# Patient Record
Sex: Male | Born: 1935 | Race: White | Hispanic: No | Marital: Married | Smoking: Former smoker
Health system: Southern US, Community
[De-identification: ages and names within clinical notes are randomized; demographics above are authoritative.]

## PROBLEM LIST (undated history)

## (undated) DIAGNOSIS — M109 Gout, unspecified: Secondary | ICD-10-CM

## (undated) DIAGNOSIS — K219 Gastro-esophageal reflux disease without esophagitis: Secondary | ICD-10-CM

## (undated) DIAGNOSIS — M1990 Unspecified osteoarthritis, unspecified site: Secondary | ICD-10-CM

## (undated) DIAGNOSIS — R011 Cardiac murmur, unspecified: Secondary | ICD-10-CM

## (undated) DIAGNOSIS — M5020 Other cervical disc displacement, unspecified cervical region: Secondary | ICD-10-CM

## (undated) DIAGNOSIS — N200 Calculus of kidney: Secondary | ICD-10-CM

## (undated) DIAGNOSIS — I10 Essential (primary) hypertension: Secondary | ICD-10-CM

## (undated) HISTORY — DX: Unspecified osteoarthritis, unspecified site: M19.90

## (undated) HISTORY — DX: Essential (primary) hypertension: I10

## (undated) HISTORY — PX: NASAL SEPTUM SURGERY: SHX37

## (undated) HISTORY — PX: OTHER SURGICAL HISTORY: SHX169

## (undated) HISTORY — DX: Calculus of kidney: N20.0

## (undated) HISTORY — DX: Gout, unspecified: M10.9

## (undated) HISTORY — DX: Cardiac murmur, unspecified: R01.1

## (undated) HISTORY — DX: Gastro-esophageal reflux disease without esophagitis: K21.9

## (undated) HISTORY — DX: Other cervical disc displacement, unspecified cervical region: M50.20

## (undated) HISTORY — PX: INGUINAL HERNIA REPAIR: SUR1180

---

## 2001-10-23 ENCOUNTER — Ambulatory Visit (HOSPITAL_COMMUNITY): Admission: RE | Admit: 2001-10-23 | Discharge: 2001-10-23 | Payer: Self-pay | Admitting: Internal Medicine

## 2001-10-23 ENCOUNTER — Encounter: Payer: Self-pay | Admitting: Internal Medicine

## 2001-11-20 ENCOUNTER — Ambulatory Visit (HOSPITAL_COMMUNITY): Admission: RE | Admit: 2001-11-20 | Discharge: 2001-11-20 | Payer: Self-pay | Admitting: Otolaryngology

## 2004-04-04 ENCOUNTER — Ambulatory Visit (HOSPITAL_COMMUNITY): Admission: RE | Admit: 2004-04-04 | Discharge: 2004-04-04 | Payer: Self-pay | Admitting: Internal Medicine

## 2004-04-15 ENCOUNTER — Ambulatory Visit (HOSPITAL_COMMUNITY): Admission: RE | Admit: 2004-04-15 | Discharge: 2004-04-15 | Payer: Self-pay | Admitting: General Surgery

## 2008-07-01 ENCOUNTER — Ambulatory Visit (HOSPITAL_COMMUNITY): Admission: RE | Admit: 2008-07-01 | Discharge: 2008-07-01 | Payer: Self-pay | Admitting: Family Medicine

## 2008-12-24 ENCOUNTER — Ambulatory Visit (HOSPITAL_COMMUNITY): Admission: RE | Admit: 2008-12-24 | Discharge: 2008-12-24 | Payer: Self-pay | Admitting: Internal Medicine

## 2009-12-05 ENCOUNTER — Inpatient Hospital Stay (HOSPITAL_COMMUNITY): Admission: AC | Admit: 2009-12-05 | Discharge: 2009-12-11 | Payer: Self-pay

## 2009-12-05 ENCOUNTER — Ambulatory Visit: Payer: Self-pay | Admitting: Cardiology

## 2009-12-06 ENCOUNTER — Encounter (INDEPENDENT_AMBULATORY_CARE_PROVIDER_SITE_OTHER): Payer: Self-pay | Admitting: General Surgery

## 2009-12-28 ENCOUNTER — Encounter: Admission: RE | Admit: 2009-12-28 | Discharge: 2009-12-28 | Payer: Self-pay | Admitting: General Surgery

## 2010-07-22 ENCOUNTER — Ambulatory Visit (HOSPITAL_COMMUNITY): Admission: RE | Admit: 2010-07-22 | Discharge: 2010-07-22 | Payer: Self-pay | Admitting: Internal Medicine

## 2010-09-25 ENCOUNTER — Encounter: Payer: Self-pay | Admitting: Family Medicine

## 2010-11-23 LAB — COMPREHENSIVE METABOLIC PANEL
ALT: 17 U/L (ref 0–53)
AST: 22 U/L (ref 0–37)
BUN: 45 mg/dL — ABNORMAL HIGH (ref 6–23)
CO2: 20 meq/L (ref 19–32)
Calcium: 9.6 mg/dL (ref 8.4–10.5)
Chloride: 111 meq/L (ref 96–112)
Creatinine, Ser: 2.1 mg/dL — ABNORMAL HIGH (ref 0.4–1.5)
GFR calc Af Amer: 38 mL/min — ABNORMAL LOW (ref >60)
Total Bilirubin: 0.8 mg/dL (ref 0.3–1.2)
Total Protein: 5.9 g/dL — ABNORMAL LOW (ref 6.0–8.3)

## 2010-11-23 LAB — TYPE AND SCREEN: Antibody Screen: NEGATIVE

## 2010-11-23 LAB — POCT I-STAT, CHEM 8
BUN: 46 mg/dL — ABNORMAL HIGH (ref 6–23)
Potassium: 3.9 meq/L (ref 3.5–5.1)
Sodium: 139 meq/L (ref 135–145)

## 2010-11-23 LAB — CARDIAC PANEL(CRET KIN+CKTOT+MB+TROPI)
CK, MB: 9.6 ng/mL (ref 0.3–4.0)
Relative Index: 4.5 — ABNORMAL HIGH (ref 0.0–2.5)
Total CK: 213 U/L (ref 7–232)

## 2010-11-23 LAB — BASIC METABOLIC PANEL
BUN: 25 mg/dL — ABNORMAL HIGH (ref 6–23)
BUN: 34 mg/dL — ABNORMAL HIGH (ref 6–23)
BUN: 41 mg/dL — ABNORMAL HIGH (ref 6–23)
CO2: 22 mEq/L (ref 19–32)
CO2: 22 mEq/L (ref 19–32)
Calcium: 8.9 mg/dL (ref 8.4–10.5)
Chloride: 109 mEq/L (ref 96–112)
Chloride: 112 mEq/L (ref 96–112)
Chloride: 113 mEq/L — ABNORMAL HIGH (ref 96–112)
Creatinine, Ser: 1.24 mg/dL (ref 0.4–1.5)
Creatinine, Ser: 1.66 mg/dL — ABNORMAL HIGH (ref 0.4–1.5)
Creatinine, Ser: 1.91 mg/dL — ABNORMAL HIGH (ref 0.4–1.5)
Creatinine, Ser: 2 mg/dL — ABNORMAL HIGH (ref 0.4–1.5)
GFR calc Af Amer: 40 mL/min — ABNORMAL LOW (ref >60)
GFR calc Af Amer: 49 mL/min — ABNORMAL LOW (ref >60)
GFR calc Af Amer: >60 mL/min (ref >60)
GFR calc non Af Amer: 35 mL/min — ABNORMAL LOW (ref >60)
GFR calc non Af Amer: 41 mL/min — ABNORMAL LOW (ref >60)
Glucose, Bld: 144 mg/dL — ABNORMAL HIGH (ref 70–99)
Potassium: 3.9 mEq/L (ref 3.5–5.1)
Potassium: 4.1 mEq/L (ref 3.5–5.1)
Potassium: 4.3 mEq/L (ref 3.5–5.1)
Potassium: 5 mEq/L (ref 3.5–5.1)
Sodium: 137 mEq/L (ref 135–145)

## 2010-11-23 LAB — CBC
HCT: 28 % — ABNORMAL LOW (ref 39.0–52.0)
HCT: 37.5 % — ABNORMAL LOW (ref 39.0–52.0)
Hemoglobin: 13.3 g/dL (ref 13.0–17.0)
Hemoglobin: 9.9 g/dL — ABNORMAL LOW (ref 13.0–17.0)
MCHC: 34.3 g/dL (ref 30.0–36.0)
MCHC: 34.5 g/dL (ref 30.0–36.0)
MCHC: 35 g/dL (ref 30.0–36.0)
MCHC: 35 g/dL (ref 30.0–36.0)
MCV: 91.9 fL (ref 78.0–100.0)
MCV: 92.4 fL (ref 78.0–100.0)
MCV: 92.7 fL (ref 78.0–100.0)
Platelets: 130 10*3/uL — ABNORMAL LOW (ref 150–400)
Platelets: 143 10*3/uL — ABNORMAL LOW (ref 150–400)
Platelets: 155 10*3/uL (ref 150–400)
Platelets: 158 10*3/uL (ref 150–400)
RBC: 2.82 MIL/uL — ABNORMAL LOW (ref 4.22–5.81)
RBC: 3.02 MIL/uL — ABNORMAL LOW (ref 4.22–5.81)
RBC: 3.06 MIL/uL — ABNORMAL LOW (ref 4.22–5.81)
RBC: 3.2 MIL/uL — ABNORMAL LOW (ref 4.22–5.81)
RBC: 3.79 MIL/uL — ABNORMAL LOW (ref 4.22–5.81)
RBC: 4.11 MIL/uL — ABNORMAL LOW (ref 4.22–5.81)
RDW: 14.7 % (ref 11.5–15.5)
RDW: 14.9 % (ref 11.5–15.5)
WBC: 10.7 10*3/uL — ABNORMAL HIGH (ref 4.0–10.5)
WBC: 7.2 10*3/uL (ref 4.0–10.5)
WBC: 9 10*3/uL (ref 4.0–10.5)

## 2010-11-23 LAB — DIFFERENTIAL
Basophils Absolute: 0 10*3/uL (ref 0.0–0.1)
Basophils Relative: 0 % (ref 0–1)
Basophils Relative: 1 % (ref 0–1)
Eosinophils Absolute: 0.1 10*3/uL (ref 0.0–0.7)
Eosinophils Relative: 1 % (ref 0–5)
Lymphs Abs: 1.1 10*3/uL (ref 0.7–4.0)
Monocytes Absolute: 0.6 10*3/uL (ref 0.1–1.0)
Monocytes Relative: 6 % (ref 3–12)
Monocytes Relative: 8 % (ref 3–12)
Neutrophils Relative %: 83 % — ABNORMAL HIGH (ref 43–77)
Neutrophils Relative %: 84 % — ABNORMAL HIGH (ref 43–77)

## 2010-11-23 LAB — CK TOTAL AND CKMB (NOT AT ARMC)
Relative Index: 0.7 (ref 0.0–2.5)
Total CK: 143 U/L (ref 7–232)
Total CK: 565 U/L — ABNORMAL HIGH (ref 7–232)

## 2010-11-23 LAB — TROPONIN I
Troponin I: 0.01 ng/mL (ref 0.00–0.06)
Troponin I: <0.01 ng/mL (ref 0.00–0.06)

## 2010-11-23 LAB — LIPASE, BLOOD: Lipase: 306 U/L — ABNORMAL HIGH (ref 11–59)

## 2010-11-23 LAB — PROTIME-INR
INR: 1.31 (ref 0.00–1.49)
Prothrombin Time: 16.2 s — ABNORMAL HIGH (ref 11.6–15.2)

## 2011-01-20 NOTE — Op Note (Signed)
NAME:  Philip Mcclain, Philip Mcclain                             ACCOUNT NO.:  [id number]   MEDICAL RECORD NO.:  [id number]                   PATIENT TYPE:  AMB   LOCATION:  DAY                                  FACILITY:  [facility]   PHYSICIAN:  Jamesetta So, M.D.               DATE OF BIRTH:  07-17-1934   DATE OF PROCEDURE:  04/15/2004  DATE OF DISCHARGE:                                 OPERATIVE REPORT   PREOPERATIVE DIAGNOSIS:  Left inguinal hernia.   POSTOPERATIVE DIAGNOSIS:  Left inguina hernia.   PROCEDURE:  Left inguinal herniorrhaphy.   SURGEON:  Jamesetta So, M.D.   ANESTHESIA:  Spinal.   INDICATIONS:  The patient is a 77 year old who presents with a left inguinal  hernia. The risks and benefits of the procedure including bleeding,  infection, pain, and recurrence of the hernia were fully explained to the  patient who gave informed consent.   PROCEDURE NOTE:  The patient was placed in the supine position. After spinal  anesthesia was administered, the left groin region was prepped and draped  using the usual sterile technique with Betadine. Surgical site confirmation  was performed.   Transverse incision was made in the left groin region down to the external  oblique aponeurosis. The aponeurosis was excised to the external ring. A  Penrose drain was placed around the spermatic cord. The vas deferens was  noted within the spermatic cord. A small lipoma of the cord was excised  without difficulty. The patient was noted to have a direct hernia. This was  excised at its base and inverted. A large sized polypropylene mesh plug was  then placed in this region, secured circumferentially to the transversalis  fascia using 2-0 Novofil interrupted sutures. An [place] polypropylene mesh  patch was then placed along the floor of the inguinal canal and secured  superiorly to the conjoined tendon and inferiorly to the shelving edge of  Poupart's ligament using a 2-0 Novofil interrupted suture. The  internal ring  was recreated using a 2-0 Novofil interrupted suture. The ilioinguinal nerve  was identified and kept from the operative field during the dissection. The  external oblique aponeurosis was reapproximated using a 2-0 Vicryl running  suture. The subcutaneous ligament was reapproximated using a 3-0 Vicryl  interrupted suture. The skin was closed using a 4-0 Vicryl subcuticular  suture. Then, 0.5 Sensorcaine was instilled into the surrounding wound, and  the wound was covered with collodion.   All tape and needle counts were correct at the end of the procedure. The  patient was transferred to PACU in stable condition.   COMPLICATIONS:  None.   SPECIMENS:  None.   BLOOD LOSS:  Minimal.      ___________________________________________  Jamesetta So, M.D.   MAJ/MEDQ  D:  04/15/2004  T:  04/16/2004  Job:  [id number]   cc:   Sherrilee Gilles. Gerarda Fraction, M.D.  [address] [place] [id number]  Fax: [phone]

## 2011-01-20 NOTE — Op Note (Signed)
[facility]  Patient:    NYAL, EBAUGH Visit Number: [id number] MRN: [id number]          Service Type: DSU Location: DAY Attending Physician:  Lavone Nian Dictated by:   Marikay Alar. Erik Obey, M.D. Proc. Date: 11/20/01 Admit Date:  11/20/2001 Discharge Date: 11/20/2001                             Operative Report  PREOPERATIVE DIAGNOSES:  Right submandibular sialolithiasis and sialadenitis.  POSTOPERATIVE DIAGNOSES:  Right submandibular sialolithiasis and sialadenitis.  PROCEDURE PERFORMED:  Right submandibular gland excision.  SURGEON:  Marikay Alar. Erik Obey, M.D.  ANESTHESIA:  General orotracheal.  BLOOD LOSS:  Minimal.  COMPLICATIONS:  None.  FINDINGS:  A large gland with mostly fatty degeneration.  A 2.5-cm-plus solid stone in the superior surface of the gland immediately beneath the floor of the mouth and basically in the hilum of the gland approaching the duct. Fibrosis in this region to the lingual nerve.  Hypoglossal nerve and lingual nerve intact.  DESCRIPTION OF PROCEDURE:  With the patient in a comfortable supine position, general orotracheal anesthesia was induced without difficulty.  At an appropriate level, the table was turned 90 degrees and the patient placed in a slight reversed Trendelenburg and the head was rotated towards the left.  The right neck was palpated, with the findings as described above, and a suitable relaxed skin tension line/wrinkle was identified and infiltrated with 5 cc of 1% Xylocaine with 1:100,000 epinephrine.  Several minutes were allowed for hemostasis to take effect.  A sterile preparation and draping of the right neck were performed.  An 8-cm incision was made in the previously identified wrinkle and carried down through skin, subcutaneous fats and platysmal muscle.  A subplatysmal plane was elevated superiorly.  The submandibular triangle was palpated.  At the inferior most extent of the triangle,  the fascia overlying the gland was incised and carefully elevated from the surface of the gland and carried upward to protect the ramus mandibularis nerve.  The nerve itself was not deliberately sought out.  Working with sharp and blunt dissection, the gland was carefully dissected but was almost indistinguishable from adjacent fat. Working posteriorly, branches of the facial artery and vein were identified and only those that were penetrating the gland were isolated, divided and ligated.  Working down towards the digastric muscle, the gland was rolled superiorly.  Working anteriorly, fatty tissue and gland were taken from lateral down toward the mylohyoid muscle and off of the anterior aspect of the digastric muscle.  Working superiorly along the capsule of the gland, the facial artery and vein were identified as the exit of the gland and were controlled in this location.  Working iteratively, the gland was further mobilized.  The mylohyoid muscle was elevated and the gland was dissected out from underneath the muscle.  Deep to the digastric muscle, blunt dissection revealed the hypoglossal nerves.  Branches of the ______ vein were carefully avoided.  Working more superiorly, the lingual nerve was identified.  The stone was palpated within the content of the gland but immediately beneath the lingual nerve.  The nerve was peeled away from the stone.  The duct was identified and was divided and ligated.  At this point, additional fibrosis and fibrous tissue separated that kept the gland attached and this was carefully dissected, divided and the gland was delivered.  The gland was opened and the stone was  removed.  No other pathology was identified.  This was sent to pathology for interpretation.  At this point, the wound was inspected.  There was mild oozing from several areas.  Electrocautery was used in noncritical areas.  In the area near the lingual nerve or near the hypoglossal nerve,  silk ligatures were used to control.  Hemostasis was observed.  A Valsalva was given and hemostasis was persistent.  At this point, the wound was thoroughly irrigated.  A one-quarter-inch Penrose drain was placed in the depths of the wound and brought out to the skin surface.  The wound was reapproximated with interrupted 3-0 chromic in the platysmal layer.  Finally, the skin was closed in a cosmetic fashion with a running simple 5-0 Ethilon.  A standard fluff and wrap dressing was applied.  At this point, the procedure was completed. Patient was returned to anesthesia, awakened, extubated, and transferred to recovery in stable condition.  COMMENT:  [age]-year-old white male, status post submandibular stone removal as much as 50 years ago, now for the past several months has had some progressive swelling in the right upper neck and both office examination and CT findings consistent with a large stone in the ______  gland which could not be retrieved in the office, hence the indication for todays procedure. Will anticipate routine postoperative recovery including elevation, analgesia, ice, antibiosis.  We will remove the drain in one day and the sutures in seven days. Dictated by:   Marikay Alar. Erik Obey, M.D. Attending Physician:  Lavone Nian DD:  11/20/01 TD:  11/22/01 Job: [id number]

## 2011-01-20 NOTE — H&P (Signed)
NAME:  Philip Mcclain, Philip Mcclain                             ACCOUNT NO.:  [id number]   MEDICAL RECORD NO.:  [id number]                   PATIENT TYPE:  AMB   LOCATION:  DAY                                  FACILITY:  [facility]   PHYSICIAN:  Jamesetta So, M.D.               DATE OF BIRTH:  [birth year]   DATE OF ADMISSION:  DATE OF DISCHARGE:                                HISTORY & PHYSICAL   CHIEF COMPLAINT:  Left inguinal hernia.   HISTORY OF PRESENT ILLNESS:  The patient is a 77 year old white male who is  referred for evaluation and treatment of a left inguinal hernia.  This has  been present intermittently for some time.  No nausea or vomiting have been  noted.  He does complain of having pain on both sides.  No fever or chills  have been noted.   PAST MEDICAL HISTORY:  Includes hypertension.   PAST SURGICAL HISTORY:  Arthroscopy of the left knee, salivary gland tumor  removal.   CURRENT MEDICATIONS:  1. Lopressor 20 mg p.o. daily.  2. Hydrochlorothiazide 10 mg p.o. daily.   ALLERGIES:  No known drug allergies.   REVIEW OF SYMPTOMS:  Noncontributory.   PHYSICAL EXAMINATION:  GENERAL:  The patient is a well-developed, well-  nourished white male in no acute distress.  VITAL SIGNS:  Patient is afebrile and his vital signs are stable.  LUNGS:  Clear to auscultation with equal breath sounds bilaterally.  HEART:  Examination reveals regular rate and rhythm without S3, S4, or  murmurs.  ABDOMEN:  Soft, nontender, nondistended.  No hepatosplenomegaly or masses  are noted.  A reducible left inguinal hernia is noted.  A weak right  inguinal floor is noted.  GENITOURINARY:  Within normal limits.   IMPRESSION:  Left inguinal hernia.   PLAN:  The patient is scheduled for a left inguinal herniorrhaphy on April 15, 2004.  The risks and benefits of the procedure including bleeding,  infection, pain, the possible recurrence of the hernia, were fully explained  to the patient, who gave informed  consent.     ___________________________________________                                         Jamesetta So, M.D.   MAJ/MEDQ  D:  04/14/2004  T:  04/14/2004  Job:  [id number]   cc:   Sherrilee Gilles. Gerarda Fraction, M.D.  [address] [place] [id number]  Fax: [phone]

## 2011-02-07 ENCOUNTER — Other Ambulatory Visit (INDEPENDENT_AMBULATORY_CARE_PROVIDER_SITE_OTHER): Payer: Self-pay | Admitting: General Surgery

## 2011-02-07 DIAGNOSIS — W3400XA Accidental discharge from unspecified firearms or gun, initial encounter: Secondary | ICD-10-CM

## 2011-02-07 LAB — COMPREHENSIVE METABOLIC PANEL
Albumin: 4.3 g/dL (ref 3.5–5.2)
CO2: 29 mEq/L (ref 19–32)
Calcium: 10.2 mg/dL (ref 8.4–10.5)
Chloride: 106 mEq/L (ref 96–112)
Glucose, Bld: 84 mg/dL (ref 70–99)
Potassium: 4.1 mEq/L (ref 3.5–5.3)
Sodium: 140 mEq/L (ref 135–145)
Total Protein: 5.9 g/dL — ABNORMAL LOW (ref 6.0–8.3)

## 2011-02-09 ENCOUNTER — Ambulatory Visit
Admission: RE | Admit: 2011-02-09 | Discharge: 2011-02-09 | Disposition: A | Discharge status: Home / Self Care | Payer: Medicare Other | Source: Ambulatory Visit | Attending: General Surgery | Admitting: General Surgery

## 2011-02-09 DIAGNOSIS — W3400XA Accidental discharge from unspecified firearms or gun, initial encounter: Secondary | ICD-10-CM

## 2011-02-09 MED ORDER — IOHEXOL 300 MG/ML  SOLN: 100.0000 mL | Freq: Once | INTRAMUSCULAR | Status: AC | PRN

## 2011-02-16 ENCOUNTER — Encounter (INDEPENDENT_AMBULATORY_CARE_PROVIDER_SITE_OTHER): Payer: Self-pay | Admitting: General Surgery

## 2011-03-09 ENCOUNTER — Ambulatory Visit (INDEPENDENT_AMBULATORY_CARE_PROVIDER_SITE_OTHER): Payer: Self-pay | Admitting: General Surgery

## 2011-03-09 ENCOUNTER — Ambulatory Visit (INDEPENDENT_AMBULATORY_CARE_PROVIDER_SITE_OTHER): Payer: Medicare Other | Admitting: General Surgery

## 2011-03-09 ENCOUNTER — Encounter (INDEPENDENT_AMBULATORY_CARE_PROVIDER_SITE_OTHER): Payer: Self-pay | Admitting: General Surgery

## 2011-03-09 VITALS — BP 170/94 | HR 96 | Temp 96.8°F | Ht 70.0 in | Wt 190.6 lb

## 2011-03-09 DIAGNOSIS — S31109A Unspecified open wound of abdominal wall, unspecified quadrant without penetration into peritoneal cavity, initial encounter: Secondary | ICD-10-CM
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
## 2014-01-29 ENCOUNTER — Other Ambulatory Visit: Payer: Self-pay | Admitting: Neurosurgery

## 2014-01-29 DIAGNOSIS — M5416 Radiculopathy, lumbar region: Secondary | ICD-10-CM

## 2014-02-10 ENCOUNTER — Ambulatory Visit
Admission: RE | Admit: 2014-02-10 | Discharge: 2014-02-10 | Disposition: A | Discharge status: Home / Self Care | Payer: Medicare Other | Source: Ambulatory Visit | Attending: Neurosurgery | Admitting: Neurosurgery

## 2014-02-10 DIAGNOSIS — M5416 Radiculopathy, lumbar region: Secondary | ICD-10-CM

## 2014-10-15 ENCOUNTER — Other Ambulatory Visit: Payer: Self-pay | Admitting: Orthopedic Surgery

## 2014-10-15 DIAGNOSIS — M25511 Pain in right shoulder: Secondary | ICD-10-CM

## 2014-10-30 ENCOUNTER — Ambulatory Visit
Admission: RE | Admit: 2014-10-30 | Discharge: 2014-10-30 | Disposition: A | Discharge status: Home / Self Care | Payer: Medicare Other | Source: Ambulatory Visit | Attending: Orthopedic Surgery | Admitting: Orthopedic Surgery

## 2014-10-30 DIAGNOSIS — M25511 Pain in right shoulder: Secondary | ICD-10-CM

## 2015-09-29 DIAGNOSIS — I10 Essential (primary) hypertension: Secondary | ICD-10-CM | POA: Diagnosis not present

## 2015-09-29 DIAGNOSIS — Z87891 Personal history of nicotine dependence: Secondary | ICD-10-CM | POA: Diagnosis not present

## 2015-09-29 DIAGNOSIS — M545 Low back pain: Secondary | ICD-10-CM | POA: Diagnosis not present

## 2015-10-29 DIAGNOSIS — N19 Unspecified kidney failure: Secondary | ICD-10-CM | POA: Diagnosis not present

## 2015-10-29 DIAGNOSIS — I272 Other secondary pulmonary hypertension: Secondary | ICD-10-CM | POA: Diagnosis not present

## 2015-10-29 DIAGNOSIS — M1990 Unspecified osteoarthritis, unspecified site: Secondary | ICD-10-CM | POA: Diagnosis not present

## 2015-11-10 DIAGNOSIS — M159 Polyosteoarthritis, unspecified: Secondary | ICD-10-CM | POA: Diagnosis not present

## 2015-11-10 DIAGNOSIS — I10 Essential (primary) hypertension: Secondary | ICD-10-CM | POA: Diagnosis not present

## 2015-11-25 DIAGNOSIS — M1990 Unspecified osteoarthritis, unspecified site: Secondary | ICD-10-CM | POA: Diagnosis not present

## 2015-11-25 DIAGNOSIS — I10 Essential (primary) hypertension: Secondary | ICD-10-CM | POA: Diagnosis not present

## 2015-11-25 DIAGNOSIS — M545 Low back pain: Secondary | ICD-10-CM | POA: Diagnosis not present

## 2015-12-21 DIAGNOSIS — M159 Polyosteoarthritis, unspecified: Secondary | ICD-10-CM | POA: Diagnosis not present

## 2015-12-21 DIAGNOSIS — I10 Essential (primary) hypertension: Secondary | ICD-10-CM | POA: Diagnosis not present

## 2015-12-23 DIAGNOSIS — I10 Essential (primary) hypertension: Secondary | ICD-10-CM | POA: Diagnosis not present

## 2015-12-23 DIAGNOSIS — M545 Low back pain: Secondary | ICD-10-CM | POA: Diagnosis not present

## 2015-12-23 DIAGNOSIS — Z299 Encounter for prophylactic measures, unspecified: Secondary | ICD-10-CM | POA: Diagnosis not present

## 2016-01-10 DIAGNOSIS — I10 Essential (primary) hypertension: Secondary | ICD-10-CM | POA: Diagnosis not present

## 2016-01-10 DIAGNOSIS — M159 Polyosteoarthritis, unspecified: Secondary | ICD-10-CM | POA: Diagnosis not present

## 2016-01-11 DIAGNOSIS — M5416 Radiculopathy, lumbar region: Secondary | ICD-10-CM | POA: Diagnosis not present

## 2016-01-11 DIAGNOSIS — M4316 Spondylolisthesis, lumbar region: Secondary | ICD-10-CM | POA: Diagnosis not present

## 2016-01-11 DIAGNOSIS — M4806 Spinal stenosis, lumbar region: Secondary | ICD-10-CM | POA: Diagnosis not present

## 2016-01-11 DIAGNOSIS — M4726 Other spondylosis with radiculopathy, lumbar region: Secondary | ICD-10-CM | POA: Diagnosis not present

## 2016-01-11 DIAGNOSIS — M5136 Other intervertebral disc degeneration, lumbar region: Secondary | ICD-10-CM | POA: Diagnosis not present

## 2016-01-11 DIAGNOSIS — M4156 Other secondary scoliosis, lumbar region: Secondary | ICD-10-CM | POA: Diagnosis not present

## 2016-01-11 DIAGNOSIS — M546 Pain in thoracic spine: Secondary | ICD-10-CM | POA: Diagnosis not present

## 2016-01-13 DIAGNOSIS — M5136 Other intervertebral disc degeneration, lumbar region: Secondary | ICD-10-CM | POA: Diagnosis not present

## 2016-01-13 DIAGNOSIS — M4726 Other spondylosis with radiculopathy, lumbar region: Secondary | ICD-10-CM | POA: Diagnosis not present

## 2016-01-13 DIAGNOSIS — M4806 Spinal stenosis, lumbar region: Secondary | ICD-10-CM | POA: Diagnosis not present

## 2016-01-21 DIAGNOSIS — I272 Other secondary pulmonary hypertension: Secondary | ICD-10-CM | POA: Diagnosis not present

## 2016-01-21 DIAGNOSIS — Z6831 Body mass index (BMI) 31.0-31.9, adult: Secondary | ICD-10-CM | POA: Diagnosis not present

## 2016-01-21 DIAGNOSIS — M545 Low back pain: Secondary | ICD-10-CM | POA: Diagnosis not present

## 2016-01-21 DIAGNOSIS — M1990 Unspecified osteoarthritis, unspecified site: Secondary | ICD-10-CM | POA: Diagnosis not present

## 2016-01-21 DIAGNOSIS — N19 Unspecified kidney failure: Secondary | ICD-10-CM | POA: Diagnosis not present

## 2016-02-01 DIAGNOSIS — M9902 Segmental and somatic dysfunction of thoracic region: Secondary | ICD-10-CM | POA: Diagnosis not present

## 2016-02-01 DIAGNOSIS — M545 Low back pain: Secondary | ICD-10-CM | POA: Diagnosis not present

## 2016-02-01 DIAGNOSIS — M9905 Segmental and somatic dysfunction of pelvic region: Secondary | ICD-10-CM | POA: Diagnosis not present

## 2016-02-01 DIAGNOSIS — M9903 Segmental and somatic dysfunction of lumbar region: Secondary | ICD-10-CM | POA: Diagnosis not present

## 2016-02-04 DIAGNOSIS — M9903 Segmental and somatic dysfunction of lumbar region: Secondary | ICD-10-CM | POA: Diagnosis not present

## 2016-02-04 DIAGNOSIS — M9905 Segmental and somatic dysfunction of pelvic region: Secondary | ICD-10-CM | POA: Diagnosis not present

## 2016-02-04 DIAGNOSIS — M545 Low back pain: Secondary | ICD-10-CM | POA: Diagnosis not present

## 2016-02-04 DIAGNOSIS — M9902 Segmental and somatic dysfunction of thoracic region: Secondary | ICD-10-CM | POA: Diagnosis not present

## 2016-02-07 DIAGNOSIS — M545 Low back pain: Secondary | ICD-10-CM | POA: Diagnosis not present

## 2016-02-07 DIAGNOSIS — M9902 Segmental and somatic dysfunction of thoracic region: Secondary | ICD-10-CM | POA: Diagnosis not present

## 2016-02-07 DIAGNOSIS — M9903 Segmental and somatic dysfunction of lumbar region: Secondary | ICD-10-CM | POA: Diagnosis not present

## 2016-02-07 DIAGNOSIS — M9905 Segmental and somatic dysfunction of pelvic region: Secondary | ICD-10-CM | POA: Diagnosis not present

## 2016-02-09 DIAGNOSIS — M545 Low back pain: Secondary | ICD-10-CM | POA: Diagnosis not present

## 2016-02-09 DIAGNOSIS — M9902 Segmental and somatic dysfunction of thoracic region: Secondary | ICD-10-CM | POA: Diagnosis not present

## 2016-02-09 DIAGNOSIS — M9903 Segmental and somatic dysfunction of lumbar region: Secondary | ICD-10-CM | POA: Diagnosis not present

## 2016-02-09 DIAGNOSIS — M9905 Segmental and somatic dysfunction of pelvic region: Secondary | ICD-10-CM | POA: Diagnosis not present

## 2016-02-10 DIAGNOSIS — M159 Polyosteoarthritis, unspecified: Secondary | ICD-10-CM | POA: Diagnosis not present

## 2016-02-10 DIAGNOSIS — I10 Essential (primary) hypertension: Secondary | ICD-10-CM | POA: Diagnosis not present

## 2016-02-11 DIAGNOSIS — M5136 Other intervertebral disc degeneration, lumbar region: Secondary | ICD-10-CM | POA: Diagnosis not present

## 2016-02-11 DIAGNOSIS — M4156 Other secondary scoliosis, lumbar region: Secondary | ICD-10-CM | POA: Diagnosis not present

## 2016-02-11 DIAGNOSIS — I10 Essential (primary) hypertension: Secondary | ICD-10-CM | POA: Diagnosis not present

## 2016-02-11 DIAGNOSIS — M4316 Spondylolisthesis, lumbar region: Secondary | ICD-10-CM | POA: Diagnosis not present

## 2016-02-11 DIAGNOSIS — M9902 Segmental and somatic dysfunction of thoracic region: Secondary | ICD-10-CM | POA: Diagnosis not present

## 2016-02-11 DIAGNOSIS — M4726 Other spondylosis with radiculopathy, lumbar region: Secondary | ICD-10-CM | POA: Diagnosis not present

## 2016-02-11 DIAGNOSIS — M9905 Segmental and somatic dysfunction of pelvic region: Secondary | ICD-10-CM | POA: Diagnosis not present

## 2016-02-11 DIAGNOSIS — Z6828 Body mass index (BMI) 28.0-28.9, adult: Secondary | ICD-10-CM | POA: Diagnosis not present

## 2016-02-11 DIAGNOSIS — M545 Low back pain: Secondary | ICD-10-CM | POA: Diagnosis not present

## 2016-02-11 DIAGNOSIS — M4806 Spinal stenosis, lumbar region: Secondary | ICD-10-CM | POA: Diagnosis not present

## 2016-02-11 DIAGNOSIS — M9903 Segmental and somatic dysfunction of lumbar region: Secondary | ICD-10-CM | POA: Diagnosis not present

## 2016-02-11 DIAGNOSIS — M5416 Radiculopathy, lumbar region: Secondary | ICD-10-CM | POA: Diagnosis not present

## 2016-02-18 DIAGNOSIS — M9902 Segmental and somatic dysfunction of thoracic region: Secondary | ICD-10-CM | POA: Diagnosis not present

## 2016-02-18 DIAGNOSIS — M545 Low back pain: Secondary | ICD-10-CM | POA: Diagnosis not present

## 2016-02-18 DIAGNOSIS — M9903 Segmental and somatic dysfunction of lumbar region: Secondary | ICD-10-CM | POA: Diagnosis not present

## 2016-02-18 DIAGNOSIS — M9905 Segmental and somatic dysfunction of pelvic region: Secondary | ICD-10-CM | POA: Diagnosis not present

## 2016-02-21 DIAGNOSIS — M545 Low back pain: Secondary | ICD-10-CM | POA: Diagnosis not present

## 2016-02-21 DIAGNOSIS — Z299 Encounter for prophylactic measures, unspecified: Secondary | ICD-10-CM | POA: Diagnosis not present

## 2016-02-21 DIAGNOSIS — I10 Essential (primary) hypertension: Secondary | ICD-10-CM | POA: Diagnosis not present

## 2016-02-25 DIAGNOSIS — M9905 Segmental and somatic dysfunction of pelvic region: Secondary | ICD-10-CM | POA: Diagnosis not present

## 2016-02-25 DIAGNOSIS — M9902 Segmental and somatic dysfunction of thoracic region: Secondary | ICD-10-CM | POA: Diagnosis not present

## 2016-02-25 DIAGNOSIS — M545 Low back pain: Secondary | ICD-10-CM | POA: Diagnosis not present

## 2016-02-25 DIAGNOSIS — M9903 Segmental and somatic dysfunction of lumbar region: Secondary | ICD-10-CM | POA: Diagnosis not present

## 2016-03-03 DIAGNOSIS — M9903 Segmental and somatic dysfunction of lumbar region: Secondary | ICD-10-CM | POA: Diagnosis not present

## 2016-03-03 DIAGNOSIS — M9902 Segmental and somatic dysfunction of thoracic region: Secondary | ICD-10-CM | POA: Diagnosis not present

## 2016-03-03 DIAGNOSIS — M545 Low back pain: Secondary | ICD-10-CM | POA: Diagnosis not present

## 2016-03-03 DIAGNOSIS — M9905 Segmental and somatic dysfunction of pelvic region: Secondary | ICD-10-CM | POA: Diagnosis not present

## 2016-03-08 DIAGNOSIS — Z1389 Encounter for screening for other disorder: Secondary | ICD-10-CM | POA: Diagnosis not present

## 2016-03-08 DIAGNOSIS — R5383 Other fatigue: Secondary | ICD-10-CM | POA: Diagnosis not present

## 2016-03-08 DIAGNOSIS — Z125 Encounter for screening for malignant neoplasm of prostate: Secondary | ICD-10-CM | POA: Diagnosis not present

## 2016-03-08 DIAGNOSIS — Z1211 Encounter for screening for malignant neoplasm of colon: Secondary | ICD-10-CM | POA: Diagnosis not present

## 2016-03-08 DIAGNOSIS — Z7189 Other specified counseling: Secondary | ICD-10-CM | POA: Diagnosis not present

## 2016-03-08 DIAGNOSIS — Z79899 Other long term (current) drug therapy: Secondary | ICD-10-CM | POA: Diagnosis not present

## 2016-03-08 DIAGNOSIS — E7800 Pure hypercholesterolemia, unspecified: Secondary | ICD-10-CM | POA: Diagnosis not present

## 2016-03-08 DIAGNOSIS — Z299 Encounter for prophylactic measures, unspecified: Secondary | ICD-10-CM | POA: Diagnosis not present

## 2016-03-08 DIAGNOSIS — Z0000 Encounter for general adult medical examination without abnormal findings: Secondary | ICD-10-CM | POA: Diagnosis not present

## 2016-03-08 DIAGNOSIS — Z6829 Body mass index (BMI) 29.0-29.9, adult: Secondary | ICD-10-CM | POA: Diagnosis not present

## 2016-03-22 DIAGNOSIS — I270 Primary pulmonary hypertension: Secondary | ICD-10-CM | POA: Diagnosis not present

## 2016-03-22 DIAGNOSIS — M545 Low back pain: Secondary | ICD-10-CM | POA: Diagnosis not present

## 2016-03-24 DIAGNOSIS — I10 Essential (primary) hypertension: Secondary | ICD-10-CM | POA: Diagnosis not present

## 2016-03-24 DIAGNOSIS — M159 Polyosteoarthritis, unspecified: Secondary | ICD-10-CM | POA: Diagnosis not present

## 2016-04-20 DIAGNOSIS — E7800 Pure hypercholesterolemia, unspecified: Secondary | ICD-10-CM | POA: Diagnosis not present

## 2016-04-20 DIAGNOSIS — I272 Other secondary pulmonary hypertension: Secondary | ICD-10-CM | POA: Diagnosis not present

## 2016-04-20 DIAGNOSIS — Z6831 Body mass index (BMI) 31.0-31.9, adult: Secondary | ICD-10-CM | POA: Diagnosis not present

## 2016-04-20 DIAGNOSIS — M1990 Unspecified osteoarthritis, unspecified site: Secondary | ICD-10-CM | POA: Diagnosis not present

## 2016-04-27 DIAGNOSIS — M159 Polyosteoarthritis, unspecified: Secondary | ICD-10-CM | POA: Diagnosis not present

## 2016-04-27 DIAGNOSIS — I10 Essential (primary) hypertension: Secondary | ICD-10-CM | POA: Diagnosis not present

## 2016-05-22 DIAGNOSIS — I270 Primary pulmonary hypertension: Secondary | ICD-10-CM | POA: Diagnosis not present

## 2016-05-22 DIAGNOSIS — M1990 Unspecified osteoarthritis, unspecified site: Secondary | ICD-10-CM | POA: Diagnosis not present

## 2016-05-22 DIAGNOSIS — Z299 Encounter for prophylactic measures, unspecified: Secondary | ICD-10-CM | POA: Diagnosis not present

## 2016-05-25 DIAGNOSIS — M159 Polyosteoarthritis, unspecified: Secondary | ICD-10-CM | POA: Diagnosis not present

## 2016-05-25 DIAGNOSIS — I10 Essential (primary) hypertension: Secondary | ICD-10-CM | POA: Diagnosis not present

## 2016-06-21 DIAGNOSIS — Z6831 Body mass index (BMI) 31.0-31.9, adult: Secondary | ICD-10-CM | POA: Diagnosis not present

## 2016-06-21 DIAGNOSIS — H6982 Other specified disorders of Eustachian tube, left ear: Secondary | ICD-10-CM | POA: Diagnosis not present

## 2016-06-21 DIAGNOSIS — Z299 Encounter for prophylactic measures, unspecified: Secondary | ICD-10-CM | POA: Diagnosis not present

## 2016-06-21 DIAGNOSIS — M545 Low back pain: Secondary | ICD-10-CM | POA: Diagnosis not present

## 2016-06-21 DIAGNOSIS — I270 Primary pulmonary hypertension: Secondary | ICD-10-CM | POA: Diagnosis not present

## 2016-07-20 DIAGNOSIS — Z299 Encounter for prophylactic measures, unspecified: Secondary | ICD-10-CM | POA: Diagnosis not present

## 2016-07-20 DIAGNOSIS — I10 Essential (primary) hypertension: Secondary | ICD-10-CM | POA: Diagnosis not present

## 2016-07-20 DIAGNOSIS — Z6831 Body mass index (BMI) 31.0-31.9, adult: Secondary | ICD-10-CM | POA: Diagnosis not present

## 2016-07-20 DIAGNOSIS — M545 Low back pain: Secondary | ICD-10-CM | POA: Diagnosis not present

## 2016-07-20 DIAGNOSIS — N400 Enlarged prostate without lower urinary tract symptoms: Secondary | ICD-10-CM | POA: Diagnosis not present

## 2016-08-03 DIAGNOSIS — M159 Polyosteoarthritis, unspecified: Secondary | ICD-10-CM | POA: Diagnosis not present

## 2016-08-03 DIAGNOSIS — I10 Essential (primary) hypertension: Secondary | ICD-10-CM | POA: Diagnosis not present

## 2016-08-08 DIAGNOSIS — M4156 Other secondary scoliosis, lumbar region: Secondary | ICD-10-CM | POA: Diagnosis not present

## 2016-08-08 DIAGNOSIS — M5136 Other intervertebral disc degeneration, lumbar region: Secondary | ICD-10-CM | POA: Diagnosis not present

## 2016-08-08 DIAGNOSIS — I10 Essential (primary) hypertension: Secondary | ICD-10-CM | POA: Diagnosis not present

## 2016-08-08 DIAGNOSIS — M48062 Spinal stenosis, lumbar region with neurogenic claudication: Secondary | ICD-10-CM | POA: Diagnosis not present

## 2016-08-08 DIAGNOSIS — M5416 Radiculopathy, lumbar region: Secondary | ICD-10-CM | POA: Diagnosis not present

## 2016-08-08 DIAGNOSIS — M4316 Spondylolisthesis, lumbar region: Secondary | ICD-10-CM | POA: Diagnosis not present

## 2016-08-08 DIAGNOSIS — Z6829 Body mass index (BMI) 29.0-29.9, adult: Secondary | ICD-10-CM | POA: Diagnosis not present

## 2016-08-18 DIAGNOSIS — Z6832 Body mass index (BMI) 32.0-32.9, adult: Secondary | ICD-10-CM | POA: Diagnosis not present

## 2016-08-18 DIAGNOSIS — Z299 Encounter for prophylactic measures, unspecified: Secondary | ICD-10-CM | POA: Diagnosis not present

## 2016-08-18 DIAGNOSIS — Z713 Dietary counseling and surveillance: Secondary | ICD-10-CM | POA: Diagnosis not present

## 2016-08-18 DIAGNOSIS — I10 Essential (primary) hypertension: Secondary | ICD-10-CM | POA: Diagnosis not present

## 2016-08-18 DIAGNOSIS — M545 Low back pain: Secondary | ICD-10-CM | POA: Diagnosis not present

## 2016-08-21 DIAGNOSIS — M159 Polyosteoarthritis, unspecified: Secondary | ICD-10-CM | POA: Diagnosis not present

## 2016-08-21 DIAGNOSIS — I10 Essential (primary) hypertension: Secondary | ICD-10-CM | POA: Diagnosis not present

## 2016-09-07 DIAGNOSIS — I10 Essential (primary) hypertension: Secondary | ICD-10-CM | POA: Diagnosis not present

## 2016-09-07 DIAGNOSIS — M159 Polyosteoarthritis, unspecified: Secondary | ICD-10-CM | POA: Diagnosis not present

## 2016-09-18 DIAGNOSIS — Z713 Dietary counseling and surveillance: Secondary | ICD-10-CM | POA: Diagnosis not present

## 2016-09-18 DIAGNOSIS — M1990 Unspecified osteoarthritis, unspecified site: Secondary | ICD-10-CM | POA: Diagnosis not present

## 2016-09-18 DIAGNOSIS — I10 Essential (primary) hypertension: Secondary | ICD-10-CM | POA: Diagnosis not present

## 2016-09-18 DIAGNOSIS — Z6831 Body mass index (BMI) 31.0-31.9, adult: Secondary | ICD-10-CM | POA: Diagnosis not present

## 2016-10-19 DIAGNOSIS — Z299 Encounter for prophylactic measures, unspecified: Secondary | ICD-10-CM | POA: Diagnosis not present

## 2016-10-19 DIAGNOSIS — Z713 Dietary counseling and surveillance: Secondary | ICD-10-CM | POA: Diagnosis not present

## 2016-10-19 DIAGNOSIS — M545 Low back pain: Secondary | ICD-10-CM | POA: Diagnosis not present

## 2016-10-19 DIAGNOSIS — Z6831 Body mass index (BMI) 31.0-31.9, adult: Secondary | ICD-10-CM | POA: Diagnosis not present

## 2016-11-06 DIAGNOSIS — M25561 Pain in right knee: Secondary | ICD-10-CM | POA: Diagnosis not present

## 2016-11-06 DIAGNOSIS — M25562 Pain in left knee: Secondary | ICD-10-CM | POA: Diagnosis not present

## 2016-11-06 DIAGNOSIS — M170 Bilateral primary osteoarthritis of knee: Secondary | ICD-10-CM | POA: Diagnosis not present

## 2016-11-16 DIAGNOSIS — M1711 Unilateral primary osteoarthritis, right knee: Secondary | ICD-10-CM | POA: Diagnosis not present

## 2016-11-16 DIAGNOSIS — M25561 Pain in right knee: Secondary | ICD-10-CM | POA: Diagnosis not present

## 2016-11-23 DIAGNOSIS — M25561 Pain in right knee: Secondary | ICD-10-CM | POA: Diagnosis not present

## 2016-11-23 DIAGNOSIS — M1711 Unilateral primary osteoarthritis, right knee: Secondary | ICD-10-CM | POA: Diagnosis not present

## 2016-11-29 DIAGNOSIS — L853 Xerosis cutis: Secondary | ICD-10-CM | POA: Diagnosis not present

## 2016-11-29 DIAGNOSIS — M79672 Pain in left foot: Secondary | ICD-10-CM | POA: Diagnosis not present

## 2016-11-30 DIAGNOSIS — M25562 Pain in left knee: Secondary | ICD-10-CM | POA: Diagnosis not present

## 2016-11-30 DIAGNOSIS — M25561 Pain in right knee: Secondary | ICD-10-CM | POA: Diagnosis not present

## 2016-11-30 DIAGNOSIS — M170 Bilateral primary osteoarthritis of knee: Secondary | ICD-10-CM | POA: Diagnosis not present

## 2016-12-18 DIAGNOSIS — Z713 Dietary counseling and surveillance: Secondary | ICD-10-CM | POA: Diagnosis not present

## 2016-12-18 DIAGNOSIS — Z299 Encounter for prophylactic measures, unspecified: Secondary | ICD-10-CM | POA: Diagnosis not present

## 2016-12-18 DIAGNOSIS — M545 Low back pain: Secondary | ICD-10-CM | POA: Diagnosis not present

## 2016-12-18 DIAGNOSIS — Z6831 Body mass index (BMI) 31.0-31.9, adult: Secondary | ICD-10-CM | POA: Diagnosis not present

## 2017-01-04 DIAGNOSIS — M159 Polyosteoarthritis, unspecified: Secondary | ICD-10-CM | POA: Diagnosis not present

## 2017-01-04 DIAGNOSIS — I10 Essential (primary) hypertension: Secondary | ICD-10-CM | POA: Diagnosis not present

## 2017-01-11 DIAGNOSIS — N400 Enlarged prostate without lower urinary tract symptoms: Secondary | ICD-10-CM | POA: Diagnosis not present

## 2017-01-11 DIAGNOSIS — Z6830 Body mass index (BMI) 30.0-30.9, adult: Secondary | ICD-10-CM | POA: Diagnosis not present

## 2017-01-11 DIAGNOSIS — M1990 Unspecified osteoarthritis, unspecified site: Secondary | ICD-10-CM | POA: Diagnosis not present

## 2017-01-11 DIAGNOSIS — Z713 Dietary counseling and surveillance: Secondary | ICD-10-CM | POA: Diagnosis not present

## 2017-01-11 DIAGNOSIS — I10 Essential (primary) hypertension: Secondary | ICD-10-CM | POA: Diagnosis not present

## 2017-01-11 DIAGNOSIS — M545 Low back pain: Secondary | ICD-10-CM | POA: Diagnosis not present

## 2017-01-11 DIAGNOSIS — J309 Allergic rhinitis, unspecified: Secondary | ICD-10-CM | POA: Diagnosis not present

## 2017-01-11 DIAGNOSIS — H6982 Other specified disorders of Eustachian tube, left ear: Secondary | ICD-10-CM | POA: Diagnosis not present

## 2017-02-13 DIAGNOSIS — M159 Polyosteoarthritis, unspecified: Secondary | ICD-10-CM | POA: Diagnosis not present

## 2017-02-13 DIAGNOSIS — I10 Essential (primary) hypertension: Secondary | ICD-10-CM | POA: Diagnosis not present

## 2017-03-08 DIAGNOSIS — I10 Essential (primary) hypertension: Secondary | ICD-10-CM | POA: Diagnosis not present

## 2017-03-08 DIAGNOSIS — M159 Polyosteoarthritis, unspecified: Secondary | ICD-10-CM | POA: Diagnosis not present

## 2017-03-16 DIAGNOSIS — Z79899 Other long term (current) drug therapy: Secondary | ICD-10-CM | POA: Diagnosis not present

## 2017-03-16 DIAGNOSIS — I340 Nonrheumatic mitral (valve) insufficiency: Secondary | ICD-10-CM | POA: Diagnosis not present

## 2017-03-16 DIAGNOSIS — M545 Low back pain: Secondary | ICD-10-CM | POA: Diagnosis not present

## 2017-03-16 DIAGNOSIS — Z299 Encounter for prophylactic measures, unspecified: Secondary | ICD-10-CM | POA: Diagnosis not present

## 2017-03-16 DIAGNOSIS — I10 Essential (primary) hypertension: Secondary | ICD-10-CM | POA: Diagnosis not present

## 2017-03-16 DIAGNOSIS — Z1389 Encounter for screening for other disorder: Secondary | ICD-10-CM | POA: Diagnosis not present

## 2017-03-16 DIAGNOSIS — I6523 Occlusion and stenosis of bilateral carotid arteries: Secondary | ICD-10-CM | POA: Diagnosis not present

## 2017-03-16 DIAGNOSIS — Z6831 Body mass index (BMI) 31.0-31.9, adult: Secondary | ICD-10-CM | POA: Diagnosis not present

## 2017-03-16 DIAGNOSIS — R5383 Other fatigue: Secondary | ICD-10-CM | POA: Diagnosis not present

## 2017-03-16 DIAGNOSIS — Z7189 Other specified counseling: Secondary | ICD-10-CM | POA: Diagnosis not present

## 2017-03-16 DIAGNOSIS — Z0000 Encounter for general adult medical examination without abnormal findings: Secondary | ICD-10-CM | POA: Diagnosis not present

## 2017-03-16 DIAGNOSIS — E7800 Pure hypercholesterolemia, unspecified: Secondary | ICD-10-CM | POA: Diagnosis not present

## 2017-03-19 DIAGNOSIS — Z125 Encounter for screening for malignant neoplasm of prostate: Secondary | ICD-10-CM | POA: Diagnosis not present

## 2017-03-19 DIAGNOSIS — E7800 Pure hypercholesterolemia, unspecified: Secondary | ICD-10-CM | POA: Diagnosis not present

## 2017-03-19 DIAGNOSIS — Z79899 Other long term (current) drug therapy: Secondary | ICD-10-CM | POA: Diagnosis not present

## 2017-03-19 DIAGNOSIS — R5383 Other fatigue: Secondary | ICD-10-CM | POA: Diagnosis not present

## 2017-03-29 DIAGNOSIS — M5116 Intervertebral disc disorders with radiculopathy, lumbar region: Secondary | ICD-10-CM | POA: Diagnosis not present

## 2017-03-29 DIAGNOSIS — M48061 Spinal stenosis, lumbar region without neurogenic claudication: Secondary | ICD-10-CM | POA: Diagnosis not present

## 2017-03-29 DIAGNOSIS — M4726 Other spondylosis with radiculopathy, lumbar region: Secondary | ICD-10-CM | POA: Diagnosis not present

## 2017-04-12 DIAGNOSIS — M159 Polyosteoarthritis, unspecified: Secondary | ICD-10-CM | POA: Diagnosis not present

## 2017-04-12 DIAGNOSIS — I10 Essential (primary) hypertension: Secondary | ICD-10-CM | POA: Diagnosis not present

## 2017-05-14 DIAGNOSIS — N400 Enlarged prostate without lower urinary tract symptoms: Secondary | ICD-10-CM | POA: Diagnosis not present

## 2017-05-14 DIAGNOSIS — I272 Pulmonary hypertension, unspecified: Secondary | ICD-10-CM | POA: Diagnosis not present

## 2017-05-14 DIAGNOSIS — E7800 Pure hypercholesterolemia, unspecified: Secondary | ICD-10-CM | POA: Diagnosis not present

## 2017-05-14 DIAGNOSIS — M545 Low back pain: Secondary | ICD-10-CM | POA: Diagnosis not present

## 2017-05-14 DIAGNOSIS — I270 Primary pulmonary hypertension: Secondary | ICD-10-CM | POA: Diagnosis not present

## 2017-05-14 DIAGNOSIS — I10 Essential (primary) hypertension: Secondary | ICD-10-CM | POA: Diagnosis not present

## 2017-05-14 DIAGNOSIS — Z299 Encounter for prophylactic measures, unspecified: Secondary | ICD-10-CM | POA: Diagnosis not present

## 2017-05-14 DIAGNOSIS — I6523 Occlusion and stenosis of bilateral carotid arteries: Secondary | ICD-10-CM | POA: Diagnosis not present

## 2017-05-14 DIAGNOSIS — Z87891 Personal history of nicotine dependence: Secondary | ICD-10-CM | POA: Diagnosis not present

## 2017-05-14 DIAGNOSIS — Z6829 Body mass index (BMI) 29.0-29.9, adult: Secondary | ICD-10-CM | POA: Diagnosis not present

## 2017-05-28 DIAGNOSIS — M159 Polyosteoarthritis, unspecified: Secondary | ICD-10-CM | POA: Diagnosis not present

## 2017-05-28 DIAGNOSIS — I10 Essential (primary) hypertension: Secondary | ICD-10-CM | POA: Diagnosis not present

## 2017-06-01 DIAGNOSIS — M5416 Radiculopathy, lumbar region: Secondary | ICD-10-CM | POA: Diagnosis not present

## 2017-06-01 DIAGNOSIS — M48062 Spinal stenosis, lumbar region with neurogenic claudication: Secondary | ICD-10-CM | POA: Diagnosis not present

## 2017-06-01 DIAGNOSIS — M4316 Spondylolisthesis, lumbar region: Secondary | ICD-10-CM | POA: Diagnosis not present

## 2017-06-01 DIAGNOSIS — M5136 Other intervertebral disc degeneration, lumbar region: Secondary | ICD-10-CM | POA: Diagnosis not present

## 2017-06-01 DIAGNOSIS — M4726 Other spondylosis with radiculopathy, lumbar region: Secondary | ICD-10-CM | POA: Diagnosis not present

## 2017-06-01 DIAGNOSIS — M4156 Other secondary scoliosis, lumbar region: Secondary | ICD-10-CM | POA: Diagnosis not present

## 2017-06-26 DIAGNOSIS — M545 Low back pain: Secondary | ICD-10-CM | POA: Diagnosis not present

## 2017-06-26 DIAGNOSIS — M9905 Segmental and somatic dysfunction of pelvic region: Secondary | ICD-10-CM | POA: Diagnosis not present

## 2017-06-26 DIAGNOSIS — M9902 Segmental and somatic dysfunction of thoracic region: Secondary | ICD-10-CM | POA: Diagnosis not present

## 2017-06-26 DIAGNOSIS — M9903 Segmental and somatic dysfunction of lumbar region: Secondary | ICD-10-CM | POA: Diagnosis not present

## 2017-06-26 DIAGNOSIS — I10 Essential (primary) hypertension: Secondary | ICD-10-CM | POA: Diagnosis not present

## 2017-06-28 DIAGNOSIS — M545 Low back pain: Secondary | ICD-10-CM | POA: Diagnosis not present

## 2017-06-28 DIAGNOSIS — M9905 Segmental and somatic dysfunction of pelvic region: Secondary | ICD-10-CM | POA: Diagnosis not present

## 2017-06-28 DIAGNOSIS — M9902 Segmental and somatic dysfunction of thoracic region: Secondary | ICD-10-CM | POA: Diagnosis not present

## 2017-06-28 DIAGNOSIS — M9903 Segmental and somatic dysfunction of lumbar region: Secondary | ICD-10-CM | POA: Diagnosis not present

## 2017-07-03 DIAGNOSIS — M159 Polyosteoarthritis, unspecified: Secondary | ICD-10-CM | POA: Diagnosis not present

## 2017-07-03 DIAGNOSIS — I10 Essential (primary) hypertension: Secondary | ICD-10-CM | POA: Diagnosis not present

## 2017-07-05 DIAGNOSIS — M9905 Segmental and somatic dysfunction of pelvic region: Secondary | ICD-10-CM | POA: Diagnosis not present

## 2017-07-05 DIAGNOSIS — M545 Low back pain: Secondary | ICD-10-CM | POA: Diagnosis not present

## 2017-07-05 DIAGNOSIS — M9902 Segmental and somatic dysfunction of thoracic region: Secondary | ICD-10-CM | POA: Diagnosis not present

## 2017-07-05 DIAGNOSIS — M9903 Segmental and somatic dysfunction of lumbar region: Secondary | ICD-10-CM | POA: Diagnosis not present

## 2017-07-16 DIAGNOSIS — Z299 Encounter for prophylactic measures, unspecified: Secondary | ICD-10-CM | POA: Diagnosis not present

## 2017-07-16 DIAGNOSIS — M545 Low back pain: Secondary | ICD-10-CM | POA: Diagnosis not present

## 2017-07-16 DIAGNOSIS — Z79899 Other long term (current) drug therapy: Secondary | ICD-10-CM | POA: Diagnosis not present

## 2017-07-16 DIAGNOSIS — I10 Essential (primary) hypertension: Secondary | ICD-10-CM | POA: Diagnosis not present

## 2017-07-16 DIAGNOSIS — Z6830 Body mass index (BMI) 30.0-30.9, adult: Secondary | ICD-10-CM | POA: Diagnosis not present

## 2017-08-07 DIAGNOSIS — I10 Essential (primary) hypertension: Secondary | ICD-10-CM | POA: Diagnosis not present

## 2017-08-07 DIAGNOSIS — M159 Polyosteoarthritis, unspecified: Secondary | ICD-10-CM | POA: Diagnosis not present

## 2017-09-14 DIAGNOSIS — I272 Pulmonary hypertension, unspecified: Secondary | ICD-10-CM | POA: Diagnosis not present

## 2017-09-14 DIAGNOSIS — Z299 Encounter for prophylactic measures, unspecified: Secondary | ICD-10-CM | POA: Diagnosis not present

## 2017-09-14 DIAGNOSIS — I6523 Occlusion and stenosis of bilateral carotid arteries: Secondary | ICD-10-CM | POA: Diagnosis not present

## 2017-09-14 DIAGNOSIS — E7800 Pure hypercholesterolemia, unspecified: Secondary | ICD-10-CM | POA: Diagnosis not present

## 2017-09-14 DIAGNOSIS — M545 Low back pain: Secondary | ICD-10-CM | POA: Diagnosis not present

## 2017-09-14 DIAGNOSIS — Z6830 Body mass index (BMI) 30.0-30.9, adult: Secondary | ICD-10-CM | POA: Diagnosis not present

## 2017-09-14 DIAGNOSIS — I10 Essential (primary) hypertension: Secondary | ICD-10-CM | POA: Diagnosis not present

## 2017-10-11 DIAGNOSIS — M159 Polyosteoarthritis, unspecified: Secondary | ICD-10-CM | POA: Diagnosis not present

## 2017-10-11 DIAGNOSIS — I10 Essential (primary) hypertension: Secondary | ICD-10-CM | POA: Diagnosis not present

## 2017-10-15 DIAGNOSIS — I6523 Occlusion and stenosis of bilateral carotid arteries: Secondary | ICD-10-CM | POA: Diagnosis not present

## 2017-10-15 DIAGNOSIS — E7800 Pure hypercholesterolemia, unspecified: Secondary | ICD-10-CM | POA: Diagnosis not present

## 2017-10-15 DIAGNOSIS — M545 Low back pain: Secondary | ICD-10-CM | POA: Diagnosis not present

## 2017-10-15 DIAGNOSIS — Z299 Encounter for prophylactic measures, unspecified: Secondary | ICD-10-CM | POA: Diagnosis not present

## 2017-10-15 DIAGNOSIS — Z6831 Body mass index (BMI) 31.0-31.9, adult: Secondary | ICD-10-CM | POA: Diagnosis not present

## 2017-10-15 DIAGNOSIS — I10 Essential (primary) hypertension: Secondary | ICD-10-CM | POA: Diagnosis not present

## 2017-11-12 DIAGNOSIS — M159 Polyosteoarthritis, unspecified: Secondary | ICD-10-CM | POA: Diagnosis not present

## 2017-11-12 DIAGNOSIS — M545 Low back pain: Secondary | ICD-10-CM | POA: Diagnosis not present

## 2017-11-12 DIAGNOSIS — I272 Pulmonary hypertension, unspecified: Secondary | ICD-10-CM | POA: Diagnosis not present

## 2017-11-12 DIAGNOSIS — I10 Essential (primary) hypertension: Secondary | ICD-10-CM | POA: Diagnosis not present

## 2017-11-12 DIAGNOSIS — Z299 Encounter for prophylactic measures, unspecified: Secondary | ICD-10-CM | POA: Diagnosis not present

## 2017-11-12 DIAGNOSIS — E7800 Pure hypercholesterolemia, unspecified: Secondary | ICD-10-CM | POA: Diagnosis not present

## 2017-11-12 DIAGNOSIS — Z6832 Body mass index (BMI) 32.0-32.9, adult: Secondary | ICD-10-CM | POA: Diagnosis not present

## 2017-12-04 DIAGNOSIS — M159 Polyosteoarthritis, unspecified: Secondary | ICD-10-CM | POA: Diagnosis not present

## 2017-12-04 DIAGNOSIS — I10 Essential (primary) hypertension: Secondary | ICD-10-CM | POA: Diagnosis not present

## 2018-01-10 DIAGNOSIS — M159 Polyosteoarthritis, unspecified: Secondary | ICD-10-CM | POA: Diagnosis not present

## 2018-01-10 DIAGNOSIS — I10 Essential (primary) hypertension: Secondary | ICD-10-CM | POA: Diagnosis not present

## 2018-01-14 DIAGNOSIS — N400 Enlarged prostate without lower urinary tract symptoms: Secondary | ICD-10-CM | POA: Diagnosis not present

## 2018-01-14 DIAGNOSIS — M545 Low back pain: Secondary | ICD-10-CM | POA: Diagnosis not present

## 2018-01-14 DIAGNOSIS — I340 Nonrheumatic mitral (valve) insufficiency: Secondary | ICD-10-CM | POA: Diagnosis not present

## 2018-01-14 DIAGNOSIS — I10 Essential (primary) hypertension: Secondary | ICD-10-CM | POA: Diagnosis not present

## 2018-01-14 DIAGNOSIS — Z299 Encounter for prophylactic measures, unspecified: Secondary | ICD-10-CM | POA: Diagnosis not present

## 2018-01-14 DIAGNOSIS — Z6831 Body mass index (BMI) 31.0-31.9, adult: Secondary | ICD-10-CM | POA: Diagnosis not present

## 2018-02-20 DIAGNOSIS — M159 Polyosteoarthritis, unspecified: Secondary | ICD-10-CM | POA: Diagnosis not present

## 2018-02-20 DIAGNOSIS — I10 Essential (primary) hypertension: Secondary | ICD-10-CM | POA: Diagnosis not present

## 2018-03-01 DIAGNOSIS — Z6828 Body mass index (BMI) 28.0-28.9, adult: Secondary | ICD-10-CM | POA: Diagnosis not present

## 2018-03-01 DIAGNOSIS — M4726 Other spondylosis with radiculopathy, lumbar region: Secondary | ICD-10-CM | POA: Diagnosis not present

## 2018-03-01 DIAGNOSIS — M5136 Other intervertebral disc degeneration, lumbar region: Secondary | ICD-10-CM | POA: Diagnosis not present

## 2018-03-01 DIAGNOSIS — M48062 Spinal stenosis, lumbar region with neurogenic claudication: Secondary | ICD-10-CM | POA: Diagnosis not present

## 2018-03-01 DIAGNOSIS — M4156 Other secondary scoliosis, lumbar region: Secondary | ICD-10-CM | POA: Diagnosis not present

## 2018-03-01 DIAGNOSIS — M5416 Radiculopathy, lumbar region: Secondary | ICD-10-CM | POA: Diagnosis not present

## 2018-03-12 DIAGNOSIS — M159 Polyosteoarthritis, unspecified: Secondary | ICD-10-CM | POA: Diagnosis not present

## 2018-03-12 DIAGNOSIS — I10 Essential (primary) hypertension: Secondary | ICD-10-CM | POA: Diagnosis not present

## 2018-03-15 DIAGNOSIS — Z299 Encounter for prophylactic measures, unspecified: Secondary | ICD-10-CM | POA: Diagnosis not present

## 2018-03-15 DIAGNOSIS — M1990 Unspecified osteoarthritis, unspecified site: Secondary | ICD-10-CM | POA: Diagnosis not present

## 2018-03-15 DIAGNOSIS — Z6831 Body mass index (BMI) 31.0-31.9, adult: Secondary | ICD-10-CM | POA: Diagnosis not present

## 2018-03-15 DIAGNOSIS — I10 Essential (primary) hypertension: Secondary | ICD-10-CM | POA: Diagnosis not present

## 2018-03-15 DIAGNOSIS — E7800 Pure hypercholesterolemia, unspecified: Secondary | ICD-10-CM | POA: Diagnosis not present

## 2018-03-15 DIAGNOSIS — I272 Pulmonary hypertension, unspecified: Secondary | ICD-10-CM | POA: Diagnosis not present

## 2018-03-26 DIAGNOSIS — E7800 Pure hypercholesterolemia, unspecified: Secondary | ICD-10-CM | POA: Diagnosis not present

## 2018-03-26 DIAGNOSIS — R5383 Other fatigue: Secondary | ICD-10-CM | POA: Diagnosis not present

## 2018-03-26 DIAGNOSIS — I10 Essential (primary) hypertension: Secondary | ICD-10-CM | POA: Diagnosis not present

## 2018-03-26 DIAGNOSIS — Z299 Encounter for prophylactic measures, unspecified: Secondary | ICD-10-CM | POA: Diagnosis not present

## 2018-03-26 DIAGNOSIS — Z1331 Encounter for screening for depression: Secondary | ICD-10-CM | POA: Diagnosis not present

## 2018-03-26 DIAGNOSIS — Z79899 Other long term (current) drug therapy: Secondary | ICD-10-CM | POA: Diagnosis not present

## 2018-03-26 DIAGNOSIS — Z6830 Body mass index (BMI) 30.0-30.9, adult: Secondary | ICD-10-CM | POA: Diagnosis not present

## 2018-03-26 DIAGNOSIS — Z0000 Encounter for general adult medical examination without abnormal findings: Secondary | ICD-10-CM | POA: Diagnosis not present

## 2018-03-26 DIAGNOSIS — Z1339 Encounter for screening examination for other mental health and behavioral disorders: Secondary | ICD-10-CM | POA: Diagnosis not present

## 2018-03-26 DIAGNOSIS — Z7189 Other specified counseling: Secondary | ICD-10-CM | POA: Diagnosis not present

## 2018-03-26 DIAGNOSIS — Z87891 Personal history of nicotine dependence: Secondary | ICD-10-CM | POA: Diagnosis not present

## 2018-03-26 DIAGNOSIS — Z125 Encounter for screening for malignant neoplasm of prostate: Secondary | ICD-10-CM | POA: Diagnosis not present

## 2018-03-26 DIAGNOSIS — Z1211 Encounter for screening for malignant neoplasm of colon: Secondary | ICD-10-CM | POA: Diagnosis not present

## 2018-04-11 DIAGNOSIS — M159 Polyosteoarthritis, unspecified: Secondary | ICD-10-CM | POA: Diagnosis not present

## 2018-04-11 DIAGNOSIS — I10 Essential (primary) hypertension: Secondary | ICD-10-CM | POA: Diagnosis not present

## 2018-04-18 DIAGNOSIS — M4726 Other spondylosis with radiculopathy, lumbar region: Secondary | ICD-10-CM | POA: Diagnosis not present

## 2018-04-18 DIAGNOSIS — M48061 Spinal stenosis, lumbar region without neurogenic claudication: Secondary | ICD-10-CM | POA: Diagnosis not present

## 2018-04-18 DIAGNOSIS — M5136 Other intervertebral disc degeneration, lumbar region: Secondary | ICD-10-CM | POA: Diagnosis not present

## 2018-04-25 DIAGNOSIS — M545 Low back pain: Secondary | ICD-10-CM | POA: Diagnosis not present

## 2018-04-25 DIAGNOSIS — Z79899 Other long term (current) drug therapy: Secondary | ICD-10-CM | POA: Diagnosis not present

## 2018-04-25 DIAGNOSIS — I10 Essential (primary) hypertension: Secondary | ICD-10-CM | POA: Diagnosis not present

## 2018-04-25 DIAGNOSIS — Z6830 Body mass index (BMI) 30.0-30.9, adult: Secondary | ICD-10-CM | POA: Diagnosis not present

## 2018-04-25 DIAGNOSIS — Z299 Encounter for prophylactic measures, unspecified: Secondary | ICD-10-CM | POA: Diagnosis not present

## 2018-05-01 DIAGNOSIS — Z79899 Other long term (current) drug therapy: Secondary | ICD-10-CM | POA: Diagnosis not present

## 2018-05-01 DIAGNOSIS — M109 Gout, unspecified: Secondary | ICD-10-CM | POA: Diagnosis not present

## 2018-05-01 DIAGNOSIS — R809 Proteinuria, unspecified: Secondary | ICD-10-CM | POA: Diagnosis not present

## 2018-05-01 DIAGNOSIS — D519 Vitamin B12 deficiency anemia, unspecified: Secondary | ICD-10-CM | POA: Diagnosis not present

## 2018-05-01 DIAGNOSIS — N183 Chronic kidney disease, stage 3 (moderate): Secondary | ICD-10-CM | POA: Diagnosis not present

## 2018-05-01 DIAGNOSIS — Z1159 Encounter for screening for other viral diseases: Secondary | ICD-10-CM | POA: Diagnosis not present

## 2018-05-01 DIAGNOSIS — D509 Iron deficiency anemia, unspecified: Secondary | ICD-10-CM | POA: Diagnosis not present

## 2018-05-01 DIAGNOSIS — I10 Essential (primary) hypertension: Secondary | ICD-10-CM | POA: Diagnosis not present

## 2018-05-01 DIAGNOSIS — E559 Vitamin D deficiency, unspecified: Secondary | ICD-10-CM | POA: Diagnosis not present

## 2018-05-02 ENCOUNTER — Other Ambulatory Visit (HOSPITAL_COMMUNITY): Payer: Self-pay | Admitting: Nephrology

## 2018-05-02 DIAGNOSIS — I10 Essential (primary) hypertension: Secondary | ICD-10-CM

## 2018-05-02 DIAGNOSIS — N183 Chronic kidney disease, stage 3 unspecified: Secondary | ICD-10-CM

## 2018-05-14 ENCOUNTER — Ambulatory Visit (HOSPITAL_COMMUNITY)
Admission: RE | Admit: 2018-05-14 | Discharge: 2018-05-14 | Disposition: A | Discharge status: Home / Self Care | Payer: Medicare Other | Source: Ambulatory Visit | Attending: Nephrology | Admitting: Nephrology

## 2018-05-14 DIAGNOSIS — N183 Chronic kidney disease, stage 3 unspecified: Secondary | ICD-10-CM

## 2018-05-14 DIAGNOSIS — I1310 Hypertensive heart and chronic kidney disease without heart failure, with stage 1 through stage 4 chronic kidney disease, or unspecified chronic kidney disease: Secondary | ICD-10-CM | POA: Insufficient documentation

## 2018-05-14 DIAGNOSIS — I080 Rheumatic disorders of both mitral and aortic valves: Secondary | ICD-10-CM | POA: Diagnosis not present

## 2018-05-14 DIAGNOSIS — I10 Essential (primary) hypertension: Secondary | ICD-10-CM

## 2018-05-14 DIAGNOSIS — N281 Cyst of kidney, acquired: Secondary | ICD-10-CM | POA: Diagnosis not present

## 2018-05-14 DIAGNOSIS — I4891 Unspecified atrial fibrillation: Secondary | ICD-10-CM | POA: Insufficient documentation

## 2018-05-14 NOTE — Progress Notes (Signed)
*  PRELIMINARY RESULTS* Echocardiogram 2D Echocardiogram has been performed.  Philip Mcclain 05/14/2018, 11:06 AM

## 2018-05-15 ENCOUNTER — Ambulatory Visit (HOSPITAL_COMMUNITY): Payer: Medicare Other

## 2018-05-15 ENCOUNTER — Other Ambulatory Visit (HOSPITAL_COMMUNITY): Payer: Medicare Other

## 2018-05-22 DIAGNOSIS — E790 Hyperuricemia without signs of inflammatory arthritis and tophaceous disease: Secondary | ICD-10-CM | POA: Diagnosis not present

## 2018-05-22 DIAGNOSIS — N183 Chronic kidney disease, stage 3 (moderate): Secondary | ICD-10-CM | POA: Diagnosis not present

## 2018-05-22 DIAGNOSIS — R809 Proteinuria, unspecified: Secondary | ICD-10-CM | POA: Diagnosis not present

## 2018-06-25 DIAGNOSIS — M545 Low back pain: Secondary | ICD-10-CM | POA: Diagnosis not present

## 2018-06-25 DIAGNOSIS — M109 Gout, unspecified: Secondary | ICD-10-CM | POA: Diagnosis not present

## 2018-06-25 DIAGNOSIS — Z6829 Body mass index (BMI) 29.0-29.9, adult: Secondary | ICD-10-CM | POA: Diagnosis not present

## 2018-06-25 DIAGNOSIS — Z299 Encounter for prophylactic measures, unspecified: Secondary | ICD-10-CM | POA: Diagnosis not present

## 2018-06-25 DIAGNOSIS — I10 Essential (primary) hypertension: Secondary | ICD-10-CM | POA: Diagnosis not present

## 2018-06-27 DIAGNOSIS — M159 Polyosteoarthritis, unspecified: Secondary | ICD-10-CM | POA: Diagnosis not present

## 2018-06-27 DIAGNOSIS — I10 Essential (primary) hypertension: Secondary | ICD-10-CM | POA: Diagnosis not present

## 2018-07-01 DIAGNOSIS — M19032 Primary osteoarthritis, left wrist: Secondary | ICD-10-CM | POA: Diagnosis not present

## 2018-07-01 DIAGNOSIS — M19242 Secondary osteoarthritis, left hand: Secondary | ICD-10-CM | POA: Diagnosis not present

## 2018-07-01 DIAGNOSIS — M1A9XX1 Chronic gout, unspecified, with tophus (tophi): Secondary | ICD-10-CM | POA: Diagnosis not present

## 2018-07-01 DIAGNOSIS — I10 Essential (primary) hypertension: Secondary | ICD-10-CM | POA: Diagnosis not present

## 2018-07-01 DIAGNOSIS — Z6829 Body mass index (BMI) 29.0-29.9, adult: Secondary | ICD-10-CM | POA: Diagnosis not present

## 2018-07-01 DIAGNOSIS — M19042 Primary osteoarthritis, left hand: Secondary | ICD-10-CM | POA: Diagnosis not present

## 2018-07-01 DIAGNOSIS — M109 Gout, unspecified: Secondary | ICD-10-CM | POA: Diagnosis not present

## 2018-07-01 DIAGNOSIS — M19041 Primary osteoarthritis, right hand: Secondary | ICD-10-CM | POA: Diagnosis not present

## 2018-07-01 DIAGNOSIS — Z299 Encounter for prophylactic measures, unspecified: Secondary | ICD-10-CM | POA: Diagnosis not present

## 2018-07-01 DIAGNOSIS — M19241 Secondary osteoarthritis, right hand: Secondary | ICD-10-CM | POA: Diagnosis not present

## 2018-07-01 DIAGNOSIS — M19031 Primary osteoarthritis, right wrist: Secondary | ICD-10-CM | POA: Diagnosis not present

## 2018-07-22 DIAGNOSIS — M159 Polyosteoarthritis, unspecified: Secondary | ICD-10-CM | POA: Diagnosis not present

## 2018-07-22 DIAGNOSIS — N183 Chronic kidney disease, stage 3 (moderate): Secondary | ICD-10-CM | POA: Diagnosis not present

## 2018-07-22 DIAGNOSIS — M109 Gout, unspecified: Secondary | ICD-10-CM | POA: Diagnosis not present

## 2018-07-22 DIAGNOSIS — I509 Heart failure, unspecified: Secondary | ICD-10-CM | POA: Diagnosis not present

## 2018-07-22 DIAGNOSIS — Z79899 Other long term (current) drug therapy: Secondary | ICD-10-CM | POA: Diagnosis not present

## 2018-07-22 DIAGNOSIS — D649 Anemia, unspecified: Secondary | ICD-10-CM | POA: Diagnosis not present

## 2018-07-22 DIAGNOSIS — R809 Proteinuria, unspecified: Secondary | ICD-10-CM | POA: Diagnosis not present

## 2018-07-22 DIAGNOSIS — I10 Essential (primary) hypertension: Secondary | ICD-10-CM | POA: Diagnosis not present

## 2018-07-25 DIAGNOSIS — N183 Chronic kidney disease, stage 3 (moderate): Secondary | ICD-10-CM | POA: Diagnosis not present

## 2018-07-25 DIAGNOSIS — E559 Vitamin D deficiency, unspecified: Secondary | ICD-10-CM | POA: Diagnosis not present

## 2018-07-25 DIAGNOSIS — M109 Gout, unspecified: Secondary | ICD-10-CM | POA: Diagnosis not present

## 2018-07-25 DIAGNOSIS — R809 Proteinuria, unspecified: Secondary | ICD-10-CM | POA: Diagnosis not present

## 2018-07-26 DIAGNOSIS — M1A9XX1 Chronic gout, unspecified, with tophus (tophi): Secondary | ICD-10-CM | POA: Diagnosis not present

## 2018-07-26 DIAGNOSIS — Z6829 Body mass index (BMI) 29.0-29.9, adult: Secondary | ICD-10-CM | POA: Diagnosis not present

## 2018-07-26 DIAGNOSIS — Z299 Encounter for prophylactic measures, unspecified: Secondary | ICD-10-CM | POA: Diagnosis not present

## 2018-07-26 DIAGNOSIS — I272 Pulmonary hypertension, unspecified: Secondary | ICD-10-CM | POA: Diagnosis not present

## 2018-07-26 DIAGNOSIS — I10 Essential (primary) hypertension: Secondary | ICD-10-CM | POA: Diagnosis not present

## 2018-08-09 DIAGNOSIS — M9902 Segmental and somatic dysfunction of thoracic region: Secondary | ICD-10-CM | POA: Diagnosis not present

## 2018-08-09 DIAGNOSIS — M9903 Segmental and somatic dysfunction of lumbar region: Secondary | ICD-10-CM | POA: Diagnosis not present

## 2018-08-09 DIAGNOSIS — M9905 Segmental and somatic dysfunction of pelvic region: Secondary | ICD-10-CM | POA: Diagnosis not present

## 2018-08-09 DIAGNOSIS — M545 Low back pain: Secondary | ICD-10-CM | POA: Diagnosis not present

## 2018-08-09 DIAGNOSIS — I10 Essential (primary) hypertension: Secondary | ICD-10-CM | POA: Diagnosis not present

## 2018-08-14 DIAGNOSIS — M9902 Segmental and somatic dysfunction of thoracic region: Secondary | ICD-10-CM | POA: Diagnosis not present

## 2018-08-14 DIAGNOSIS — M9905 Segmental and somatic dysfunction of pelvic region: Secondary | ICD-10-CM | POA: Diagnosis not present

## 2018-08-14 DIAGNOSIS — M545 Low back pain: Secondary | ICD-10-CM | POA: Diagnosis not present

## 2018-08-14 DIAGNOSIS — M9903 Segmental and somatic dysfunction of lumbar region: Secondary | ICD-10-CM | POA: Diagnosis not present

## 2018-08-14 DIAGNOSIS — I10 Essential (primary) hypertension: Secondary | ICD-10-CM | POA: Diagnosis not present

## 2018-08-16 DIAGNOSIS — M545 Low back pain: Secondary | ICD-10-CM | POA: Diagnosis not present

## 2018-08-16 DIAGNOSIS — M9903 Segmental and somatic dysfunction of lumbar region: Secondary | ICD-10-CM | POA: Diagnosis not present

## 2018-08-16 DIAGNOSIS — M9902 Segmental and somatic dysfunction of thoracic region: Secondary | ICD-10-CM | POA: Diagnosis not present

## 2018-08-16 DIAGNOSIS — M159 Polyosteoarthritis, unspecified: Secondary | ICD-10-CM | POA: Diagnosis not present

## 2018-08-16 DIAGNOSIS — I10 Essential (primary) hypertension: Secondary | ICD-10-CM | POA: Diagnosis not present

## 2018-08-16 DIAGNOSIS — M9905 Segmental and somatic dysfunction of pelvic region: Secondary | ICD-10-CM | POA: Diagnosis not present

## 2018-08-23 DIAGNOSIS — I10 Essential (primary) hypertension: Secondary | ICD-10-CM | POA: Diagnosis not present

## 2018-08-23 DIAGNOSIS — Z299 Encounter for prophylactic measures, unspecified: Secondary | ICD-10-CM | POA: Diagnosis not present

## 2018-08-23 DIAGNOSIS — Z6829 Body mass index (BMI) 29.0-29.9, adult: Secondary | ICD-10-CM | POA: Diagnosis not present

## 2018-08-23 DIAGNOSIS — M1990 Unspecified osteoarthritis, unspecified site: Secondary | ICD-10-CM | POA: Diagnosis not present

## 2018-09-23 DIAGNOSIS — E7800 Pure hypercholesterolemia, unspecified: Secondary | ICD-10-CM | POA: Diagnosis not present

## 2018-09-23 DIAGNOSIS — I10 Essential (primary) hypertension: Secondary | ICD-10-CM | POA: Diagnosis not present

## 2018-09-23 DIAGNOSIS — I272 Pulmonary hypertension, unspecified: Secondary | ICD-10-CM | POA: Diagnosis not present

## 2018-09-23 DIAGNOSIS — Z299 Encounter for prophylactic measures, unspecified: Secondary | ICD-10-CM | POA: Diagnosis not present

## 2018-09-23 DIAGNOSIS — Z6829 Body mass index (BMI) 29.0-29.9, adult: Secondary | ICD-10-CM | POA: Diagnosis not present

## 2018-09-23 DIAGNOSIS — M545 Low back pain: Secondary | ICD-10-CM | POA: Diagnosis not present

## 2018-09-27 DIAGNOSIS — I10 Essential (primary) hypertension: Secondary | ICD-10-CM | POA: Diagnosis not present

## 2018-09-27 DIAGNOSIS — M159 Polyosteoarthritis, unspecified: Secondary | ICD-10-CM | POA: Diagnosis not present

## 2018-10-03 DIAGNOSIS — M4726 Other spondylosis with radiculopathy, lumbar region: Secondary | ICD-10-CM | POA: Diagnosis not present

## 2018-10-03 DIAGNOSIS — M48061 Spinal stenosis, lumbar region without neurogenic claudication: Secondary | ICD-10-CM | POA: Diagnosis not present

## 2018-10-03 DIAGNOSIS — M5136 Other intervertebral disc degeneration, lumbar region: Secondary | ICD-10-CM | POA: Diagnosis not present

## 2018-10-08 DIAGNOSIS — Z79899 Other long term (current) drug therapy: Secondary | ICD-10-CM | POA: Diagnosis not present

## 2018-10-08 DIAGNOSIS — R809 Proteinuria, unspecified: Secondary | ICD-10-CM | POA: Diagnosis not present

## 2018-10-08 DIAGNOSIS — E559 Vitamin D deficiency, unspecified: Secondary | ICD-10-CM | POA: Diagnosis not present

## 2018-10-08 DIAGNOSIS — N183 Chronic kidney disease, stage 3 (moderate): Secondary | ICD-10-CM | POA: Diagnosis not present

## 2018-10-08 DIAGNOSIS — I10 Essential (primary) hypertension: Secondary | ICD-10-CM | POA: Diagnosis not present

## 2018-10-08 DIAGNOSIS — D649 Anemia, unspecified: Secondary | ICD-10-CM | POA: Diagnosis not present

## 2018-10-16 DIAGNOSIS — R809 Proteinuria, unspecified: Secondary | ICD-10-CM | POA: Diagnosis not present

## 2018-10-16 DIAGNOSIS — E559 Vitamin D deficiency, unspecified: Secondary | ICD-10-CM | POA: Diagnosis not present

## 2018-10-16 DIAGNOSIS — I10 Essential (primary) hypertension: Secondary | ICD-10-CM | POA: Diagnosis not present

## 2018-10-16 DIAGNOSIS — N183 Chronic kidney disease, stage 3 (moderate): Secondary | ICD-10-CM | POA: Diagnosis not present

## 2018-10-16 DIAGNOSIS — I509 Heart failure, unspecified: Secondary | ICD-10-CM | POA: Diagnosis not present

## 2018-10-16 DIAGNOSIS — N400 Enlarged prostate without lower urinary tract symptoms: Secondary | ICD-10-CM | POA: Diagnosis not present

## 2018-10-16 DIAGNOSIS — N2581 Secondary hyperparathyroidism of renal origin: Secondary | ICD-10-CM | POA: Diagnosis not present

## 2018-10-24 DIAGNOSIS — M545 Low back pain: Secondary | ICD-10-CM | POA: Diagnosis not present

## 2018-10-24 DIAGNOSIS — Z299 Encounter for prophylactic measures, unspecified: Secondary | ICD-10-CM | POA: Diagnosis not present

## 2018-10-24 DIAGNOSIS — I10 Essential (primary) hypertension: Secondary | ICD-10-CM | POA: Diagnosis not present

## 2018-10-24 DIAGNOSIS — Z6829 Body mass index (BMI) 29.0-29.9, adult: Secondary | ICD-10-CM | POA: Diagnosis not present

## 2018-11-06 DIAGNOSIS — I10 Essential (primary) hypertension: Secondary | ICD-10-CM | POA: Diagnosis not present

## 2018-11-06 DIAGNOSIS — L03119 Cellulitis of unspecified part of limb: Secondary | ICD-10-CM | POA: Diagnosis not present

## 2018-11-06 DIAGNOSIS — Z6829 Body mass index (BMI) 29.0-29.9, adult: Secondary | ICD-10-CM | POA: Diagnosis not present

## 2018-11-06 DIAGNOSIS — B029 Zoster without complications: Secondary | ICD-10-CM | POA: Diagnosis not present

## 2018-11-06 DIAGNOSIS — Z299 Encounter for prophylactic measures, unspecified: Secondary | ICD-10-CM | POA: Diagnosis not present

## 2018-11-22 DIAGNOSIS — Z713 Dietary counseling and surveillance: Secondary | ICD-10-CM | POA: Diagnosis not present

## 2018-11-22 DIAGNOSIS — I10 Essential (primary) hypertension: Secondary | ICD-10-CM | POA: Diagnosis not present

## 2018-11-22 DIAGNOSIS — M545 Low back pain: Secondary | ICD-10-CM | POA: Diagnosis not present

## 2018-11-22 DIAGNOSIS — Z79899 Other long term (current) drug therapy: Secondary | ICD-10-CM | POA: Diagnosis not present

## 2018-11-22 DIAGNOSIS — Z6829 Body mass index (BMI) 29.0-29.9, adult: Secondary | ICD-10-CM | POA: Diagnosis not present

## 2018-11-22 DIAGNOSIS — Z299 Encounter for prophylactic measures, unspecified: Secondary | ICD-10-CM | POA: Diagnosis not present

## 2018-11-26 DIAGNOSIS — I10 Essential (primary) hypertension: Secondary | ICD-10-CM | POA: Diagnosis not present

## 2018-11-26 DIAGNOSIS — L03119 Cellulitis of unspecified part of limb: Secondary | ICD-10-CM | POA: Diagnosis not present

## 2018-11-26 DIAGNOSIS — Z713 Dietary counseling and surveillance: Secondary | ICD-10-CM | POA: Diagnosis not present

## 2018-11-26 DIAGNOSIS — Z299 Encounter for prophylactic measures, unspecified: Secondary | ICD-10-CM | POA: Diagnosis not present

## 2018-11-26 DIAGNOSIS — B029 Zoster without complications: Secondary | ICD-10-CM | POA: Diagnosis not present

## 2018-12-03 DIAGNOSIS — M159 Polyosteoarthritis, unspecified: Secondary | ICD-10-CM | POA: Diagnosis not present

## 2018-12-03 DIAGNOSIS — I10 Essential (primary) hypertension: Secondary | ICD-10-CM | POA: Diagnosis not present

## 2018-12-23 DIAGNOSIS — I10 Essential (primary) hypertension: Secondary | ICD-10-CM | POA: Diagnosis not present

## 2018-12-23 DIAGNOSIS — Z6829 Body mass index (BMI) 29.0-29.9, adult: Secondary | ICD-10-CM | POA: Diagnosis not present

## 2018-12-23 DIAGNOSIS — M545 Low back pain: Secondary | ICD-10-CM | POA: Diagnosis not present

## 2018-12-23 DIAGNOSIS — Z713 Dietary counseling and surveillance: Secondary | ICD-10-CM | POA: Diagnosis not present

## 2018-12-23 DIAGNOSIS — Z299 Encounter for prophylactic measures, unspecified: Secondary | ICD-10-CM | POA: Diagnosis not present

## 2018-12-31 DIAGNOSIS — M159 Polyosteoarthritis, unspecified: Secondary | ICD-10-CM | POA: Diagnosis not present

## 2018-12-31 DIAGNOSIS — I10 Essential (primary) hypertension: Secondary | ICD-10-CM | POA: Diagnosis not present

## 2019-01-22 DIAGNOSIS — Z79899 Other long term (current) drug therapy: Secondary | ICD-10-CM | POA: Diagnosis not present

## 2019-01-22 DIAGNOSIS — I340 Nonrheumatic mitral (valve) insufficiency: Secondary | ICD-10-CM | POA: Diagnosis not present

## 2019-01-22 DIAGNOSIS — M545 Low back pain: Secondary | ICD-10-CM | POA: Diagnosis not present

## 2019-01-22 DIAGNOSIS — I272 Pulmonary hypertension, unspecified: Secondary | ICD-10-CM | POA: Diagnosis not present

## 2019-01-22 DIAGNOSIS — I10 Essential (primary) hypertension: Secondary | ICD-10-CM | POA: Diagnosis not present

## 2019-01-22 DIAGNOSIS — Z299 Encounter for prophylactic measures, unspecified: Secondary | ICD-10-CM | POA: Diagnosis not present

## 2019-01-22 DIAGNOSIS — Z6831 Body mass index (BMI) 31.0-31.9, adult: Secondary | ICD-10-CM | POA: Diagnosis not present

## 2019-01-28 DIAGNOSIS — Z79899 Other long term (current) drug therapy: Secondary | ICD-10-CM | POA: Diagnosis not present

## 2019-01-28 DIAGNOSIS — N183 Chronic kidney disease, stage 3 (moderate): Secondary | ICD-10-CM | POA: Diagnosis not present

## 2019-01-28 DIAGNOSIS — E559 Vitamin D deficiency, unspecified: Secondary | ICD-10-CM | POA: Diagnosis not present

## 2019-01-28 DIAGNOSIS — D649 Anemia, unspecified: Secondary | ICD-10-CM | POA: Diagnosis not present

## 2019-01-28 DIAGNOSIS — R809 Proteinuria, unspecified: Secondary | ICD-10-CM | POA: Diagnosis not present

## 2019-01-28 DIAGNOSIS — I10 Essential (primary) hypertension: Secondary | ICD-10-CM | POA: Diagnosis not present

## 2019-02-20 DIAGNOSIS — I10 Essential (primary) hypertension: Secondary | ICD-10-CM | POA: Diagnosis not present

## 2019-02-20 DIAGNOSIS — M159 Polyosteoarthritis, unspecified: Secondary | ICD-10-CM | POA: Diagnosis not present

## 2019-02-21 DIAGNOSIS — I272 Pulmonary hypertension, unspecified: Secondary | ICD-10-CM | POA: Diagnosis not present

## 2019-02-21 DIAGNOSIS — R600 Localized edema: Secondary | ICD-10-CM | POA: Diagnosis not present

## 2019-02-21 DIAGNOSIS — Z299 Encounter for prophylactic measures, unspecified: Secondary | ICD-10-CM | POA: Diagnosis not present

## 2019-02-21 DIAGNOSIS — Z79899 Other long term (current) drug therapy: Secondary | ICD-10-CM | POA: Diagnosis not present

## 2019-02-21 DIAGNOSIS — Z6831 Body mass index (BMI) 31.0-31.9, adult: Secondary | ICD-10-CM | POA: Diagnosis not present

## 2019-02-21 DIAGNOSIS — I10 Essential (primary) hypertension: Secondary | ICD-10-CM | POA: Diagnosis not present

## 2019-02-25 DIAGNOSIS — I10 Essential (primary) hypertension: Secondary | ICD-10-CM | POA: Diagnosis not present

## 2019-02-25 DIAGNOSIS — I509 Heart failure, unspecified: Secondary | ICD-10-CM | POA: Diagnosis not present

## 2019-02-25 DIAGNOSIS — N183 Chronic kidney disease, stage 3 (moderate): Secondary | ICD-10-CM | POA: Diagnosis not present

## 2019-02-25 DIAGNOSIS — E559 Vitamin D deficiency, unspecified: Secondary | ICD-10-CM | POA: Diagnosis not present

## 2019-02-25 DIAGNOSIS — R809 Proteinuria, unspecified: Secondary | ICD-10-CM | POA: Diagnosis not present

## 2019-02-25 DIAGNOSIS — N400 Enlarged prostate without lower urinary tract symptoms: Secondary | ICD-10-CM | POA: Diagnosis not present

## 2019-02-25 DIAGNOSIS — N2581 Secondary hyperparathyroidism of renal origin: Secondary | ICD-10-CM | POA: Diagnosis not present

## 2019-02-27 DIAGNOSIS — Z79899 Other long term (current) drug therapy: Secondary | ICD-10-CM | POA: Diagnosis not present

## 2019-02-28 DIAGNOSIS — M4726 Other spondylosis with radiculopathy, lumbar region: Secondary | ICD-10-CM | POA: Diagnosis not present

## 2019-02-28 DIAGNOSIS — Z6828 Body mass index (BMI) 28.0-28.9, adult: Secondary | ICD-10-CM | POA: Diagnosis not present

## 2019-02-28 DIAGNOSIS — I10 Essential (primary) hypertension: Secondary | ICD-10-CM | POA: Diagnosis not present

## 2019-02-28 DIAGNOSIS — M4316 Spondylolisthesis, lumbar region: Secondary | ICD-10-CM | POA: Diagnosis not present

## 2019-02-28 DIAGNOSIS — M5136 Other intervertebral disc degeneration, lumbar region: Secondary | ICD-10-CM | POA: Diagnosis not present

## 2019-02-28 DIAGNOSIS — M48062 Spinal stenosis, lumbar region with neurogenic claudication: Secondary | ICD-10-CM | POA: Diagnosis not present

## 2019-02-28 DIAGNOSIS — M4156 Other secondary scoliosis, lumbar region: Secondary | ICD-10-CM | POA: Diagnosis not present

## 2019-02-28 DIAGNOSIS — M5416 Radiculopathy, lumbar region: Secondary | ICD-10-CM | POA: Diagnosis not present

## 2019-03-19 DIAGNOSIS — M159 Polyosteoarthritis, unspecified: Secondary | ICD-10-CM | POA: Diagnosis not present

## 2019-03-19 DIAGNOSIS — I10 Essential (primary) hypertension: Secondary | ICD-10-CM | POA: Diagnosis not present

## 2019-03-21 DIAGNOSIS — I10 Essential (primary) hypertension: Secondary | ICD-10-CM | POA: Diagnosis not present

## 2019-03-21 DIAGNOSIS — Z79899 Other long term (current) drug therapy: Secondary | ICD-10-CM | POA: Diagnosis not present

## 2019-03-21 DIAGNOSIS — I272 Pulmonary hypertension, unspecified: Secondary | ICD-10-CM | POA: Diagnosis not present

## 2019-03-21 DIAGNOSIS — Z299 Encounter for prophylactic measures, unspecified: Secondary | ICD-10-CM | POA: Diagnosis not present

## 2019-03-21 DIAGNOSIS — M545 Low back pain: Secondary | ICD-10-CM | POA: Diagnosis not present

## 2019-03-21 DIAGNOSIS — Z6829 Body mass index (BMI) 29.0-29.9, adult: Secondary | ICD-10-CM | POA: Diagnosis not present

## 2019-03-28 DIAGNOSIS — R5383 Other fatigue: Secondary | ICD-10-CM | POA: Diagnosis not present

## 2019-03-28 DIAGNOSIS — E7800 Pure hypercholesterolemia, unspecified: Secondary | ICD-10-CM | POA: Diagnosis not present

## 2019-03-28 DIAGNOSIS — Z0000 Encounter for general adult medical examination without abnormal findings: Secondary | ICD-10-CM | POA: Diagnosis not present

## 2019-03-28 DIAGNOSIS — I270 Primary pulmonary hypertension: Secondary | ICD-10-CM | POA: Diagnosis not present

## 2019-03-28 DIAGNOSIS — Z1339 Encounter for screening examination for other mental health and behavioral disorders: Secondary | ICD-10-CM | POA: Diagnosis not present

## 2019-03-28 DIAGNOSIS — Z125 Encounter for screening for malignant neoplasm of prostate: Secondary | ICD-10-CM | POA: Diagnosis not present

## 2019-03-28 DIAGNOSIS — Z1211 Encounter for screening for malignant neoplasm of colon: Secondary | ICD-10-CM | POA: Diagnosis not present

## 2019-03-28 DIAGNOSIS — Z7189 Other specified counseling: Secondary | ICD-10-CM | POA: Diagnosis not present

## 2019-03-28 DIAGNOSIS — Z87891 Personal history of nicotine dependence: Secondary | ICD-10-CM | POA: Diagnosis not present

## 2019-03-28 DIAGNOSIS — Z6829 Body mass index (BMI) 29.0-29.9, adult: Secondary | ICD-10-CM | POA: Diagnosis not present

## 2019-03-28 DIAGNOSIS — Z79899 Other long term (current) drug therapy: Secondary | ICD-10-CM | POA: Diagnosis not present

## 2019-03-28 DIAGNOSIS — Z299 Encounter for prophylactic measures, unspecified: Secondary | ICD-10-CM | POA: Diagnosis not present

## 2019-03-28 DIAGNOSIS — Z1331 Encounter for screening for depression: Secondary | ICD-10-CM | POA: Diagnosis not present

## 2019-03-31 DIAGNOSIS — Z6830 Body mass index (BMI) 30.0-30.9, adult: Secondary | ICD-10-CM | POA: Diagnosis not present

## 2019-03-31 DIAGNOSIS — Z713 Dietary counseling and surveillance: Secondary | ICD-10-CM | POA: Diagnosis not present

## 2019-03-31 DIAGNOSIS — I10 Essential (primary) hypertension: Secondary | ICD-10-CM | POA: Diagnosis not present

## 2019-03-31 DIAGNOSIS — L03119 Cellulitis of unspecified part of limb: Secondary | ICD-10-CM | POA: Diagnosis not present

## 2019-03-31 DIAGNOSIS — Z299 Encounter for prophylactic measures, unspecified: Secondary | ICD-10-CM | POA: Diagnosis not present

## 2019-04-04 DIAGNOSIS — M545 Low back pain: Secondary | ICD-10-CM | POA: Diagnosis not present

## 2019-04-04 DIAGNOSIS — L03119 Cellulitis of unspecified part of limb: Secondary | ICD-10-CM | POA: Diagnosis not present

## 2019-04-04 DIAGNOSIS — Z299 Encounter for prophylactic measures, unspecified: Secondary | ICD-10-CM | POA: Diagnosis not present

## 2019-04-04 DIAGNOSIS — M1A9XX1 Chronic gout, unspecified, with tophus (tophi): Secondary | ICD-10-CM | POA: Diagnosis not present

## 2019-04-04 DIAGNOSIS — Z6830 Body mass index (BMI) 30.0-30.9, adult: Secondary | ICD-10-CM | POA: Diagnosis not present

## 2019-04-04 DIAGNOSIS — I10 Essential (primary) hypertension: Secondary | ICD-10-CM | POA: Diagnosis not present

## 2019-04-14 DIAGNOSIS — I270 Primary pulmonary hypertension: Secondary | ICD-10-CM | POA: Diagnosis not present

## 2019-04-14 DIAGNOSIS — I10 Essential (primary) hypertension: Secondary | ICD-10-CM | POA: Diagnosis not present

## 2019-04-14 DIAGNOSIS — E7800 Pure hypercholesterolemia, unspecified: Secondary | ICD-10-CM | POA: Diagnosis not present

## 2019-04-14 DIAGNOSIS — Z299 Encounter for prophylactic measures, unspecified: Secondary | ICD-10-CM | POA: Diagnosis not present

## 2019-04-14 DIAGNOSIS — L03119 Cellulitis of unspecified part of limb: Secondary | ICD-10-CM | POA: Diagnosis not present

## 2019-04-14 DIAGNOSIS — Z6829 Body mass index (BMI) 29.0-29.9, adult: Secondary | ICD-10-CM | POA: Diagnosis not present

## 2019-04-18 DIAGNOSIS — M159 Polyosteoarthritis, unspecified: Secondary | ICD-10-CM | POA: Diagnosis not present

## 2019-04-18 DIAGNOSIS — I10 Essential (primary) hypertension: Secondary | ICD-10-CM | POA: Diagnosis not present

## 2019-04-21 DIAGNOSIS — Z299 Encounter for prophylactic measures, unspecified: Secondary | ICD-10-CM | POA: Diagnosis not present

## 2019-04-21 DIAGNOSIS — I270 Primary pulmonary hypertension: Secondary | ICD-10-CM | POA: Diagnosis not present

## 2019-04-21 DIAGNOSIS — I10 Essential (primary) hypertension: Secondary | ICD-10-CM | POA: Diagnosis not present

## 2019-04-21 DIAGNOSIS — M545 Low back pain: Secondary | ICD-10-CM | POA: Diagnosis not present

## 2019-04-21 DIAGNOSIS — Z6829 Body mass index (BMI) 29.0-29.9, adult: Secondary | ICD-10-CM | POA: Diagnosis not present

## 2019-04-21 DIAGNOSIS — E7800 Pure hypercholesterolemia, unspecified: Secondary | ICD-10-CM | POA: Diagnosis not present

## 2019-04-28 DIAGNOSIS — I10 Essential (primary) hypertension: Secondary | ICD-10-CM | POA: Diagnosis not present

## 2019-04-28 DIAGNOSIS — Z6829 Body mass index (BMI) 29.0-29.9, adult: Secondary | ICD-10-CM | POA: Diagnosis not present

## 2019-04-28 DIAGNOSIS — Z299 Encounter for prophylactic measures, unspecified: Secondary | ICD-10-CM | POA: Diagnosis not present

## 2019-05-16 DIAGNOSIS — M159 Polyosteoarthritis, unspecified: Secondary | ICD-10-CM | POA: Diagnosis not present

## 2019-05-16 DIAGNOSIS — I10 Essential (primary) hypertension: Secondary | ICD-10-CM | POA: Diagnosis not present

## 2019-05-22 DIAGNOSIS — M545 Low back pain: Secondary | ICD-10-CM | POA: Diagnosis not present

## 2019-05-22 DIAGNOSIS — I10 Essential (primary) hypertension: Secondary | ICD-10-CM | POA: Diagnosis not present

## 2019-05-22 DIAGNOSIS — E7800 Pure hypercholesterolemia, unspecified: Secondary | ICD-10-CM | POA: Diagnosis not present

## 2019-05-22 DIAGNOSIS — Z6829 Body mass index (BMI) 29.0-29.9, adult: Secondary | ICD-10-CM | POA: Diagnosis not present

## 2019-05-22 DIAGNOSIS — I270 Primary pulmonary hypertension: Secondary | ICD-10-CM | POA: Diagnosis not present

## 2019-05-22 DIAGNOSIS — Z299 Encounter for prophylactic measures, unspecified: Secondary | ICD-10-CM | POA: Diagnosis not present

## 2019-06-12 DIAGNOSIS — M159 Polyosteoarthritis, unspecified: Secondary | ICD-10-CM | POA: Diagnosis not present

## 2019-06-12 DIAGNOSIS — I10 Essential (primary) hypertension: Secondary | ICD-10-CM | POA: Diagnosis not present

## 2019-06-20 DIAGNOSIS — N1832 Chronic kidney disease, stage 3b: Secondary | ICD-10-CM | POA: Diagnosis not present

## 2019-06-20 DIAGNOSIS — M545 Low back pain: Secondary | ICD-10-CM | POA: Diagnosis not present

## 2019-06-20 DIAGNOSIS — Z6829 Body mass index (BMI) 29.0-29.9, adult: Secondary | ICD-10-CM | POA: Diagnosis not present

## 2019-06-20 DIAGNOSIS — I10 Essential (primary) hypertension: Secondary | ICD-10-CM | POA: Diagnosis not present

## 2019-06-20 DIAGNOSIS — Z23 Encounter for immunization: Secondary | ICD-10-CM | POA: Diagnosis not present

## 2019-06-20 DIAGNOSIS — Z299 Encounter for prophylactic measures, unspecified: Secondary | ICD-10-CM | POA: Diagnosis not present

## 2019-07-18 DIAGNOSIS — Z299 Encounter for prophylactic measures, unspecified: Secondary | ICD-10-CM | POA: Diagnosis not present

## 2019-07-18 DIAGNOSIS — M545 Low back pain: Secondary | ICD-10-CM | POA: Diagnosis not present

## 2019-07-18 DIAGNOSIS — N1832 Chronic kidney disease, stage 3b: Secondary | ICD-10-CM | POA: Diagnosis not present

## 2019-07-18 DIAGNOSIS — Z6828 Body mass index (BMI) 28.0-28.9, adult: Secondary | ICD-10-CM | POA: Diagnosis not present

## 2019-07-18 DIAGNOSIS — I10 Essential (primary) hypertension: Secondary | ICD-10-CM | POA: Diagnosis not present

## 2019-08-05 DIAGNOSIS — I10 Essential (primary) hypertension: Secondary | ICD-10-CM | POA: Diagnosis not present

## 2019-08-05 DIAGNOSIS — M159 Polyosteoarthritis, unspecified: Secondary | ICD-10-CM | POA: Diagnosis not present

## 2019-08-26 DIAGNOSIS — J1289 Other viral pneumonia: Secondary | ICD-10-CM | POA: Diagnosis not present

## 2019-08-26 DIAGNOSIS — U071 COVID-19: Secondary | ICD-10-CM | POA: Diagnosis not present

## 2019-08-26 DIAGNOSIS — R0602 Shortness of breath: Secondary | ICD-10-CM | POA: Diagnosis not present

## 2019-08-26 DIAGNOSIS — J189 Pneumonia, unspecified organism: Secondary | ICD-10-CM | POA: Diagnosis not present

## 2019-08-26 DIAGNOSIS — J440 Chronic obstructive pulmonary disease with acute lower respiratory infection: Secondary | ICD-10-CM | POA: Diagnosis not present

## 2019-08-27 DIAGNOSIS — J189 Pneumonia, unspecified organism: Secondary | ICD-10-CM | POA: Diagnosis not present

## 2019-08-27 DIAGNOSIS — U071 COVID-19: Secondary | ICD-10-CM | POA: Diagnosis not present

## 2019-08-27 DIAGNOSIS — J440 Chronic obstructive pulmonary disease with acute lower respiratory infection: Secondary | ICD-10-CM | POA: Diagnosis not present

## 2019-08-27 DIAGNOSIS — J1289 Other viral pneumonia: Secondary | ICD-10-CM | POA: Diagnosis not present

## 2019-08-28 DIAGNOSIS — U071 COVID-19: Secondary | ICD-10-CM | POA: Diagnosis not present

## 2019-08-28 DIAGNOSIS — J440 Chronic obstructive pulmonary disease with acute lower respiratory infection: Secondary | ICD-10-CM | POA: Diagnosis not present

## 2019-08-28 DIAGNOSIS — J1289 Other viral pneumonia: Secondary | ICD-10-CM | POA: Diagnosis not present

## 2019-08-28 DIAGNOSIS — J189 Pneumonia, unspecified organism: Secondary | ICD-10-CM | POA: Diagnosis not present

## 2019-08-29 DIAGNOSIS — U071 COVID-19: Secondary | ICD-10-CM | POA: Diagnosis not present

## 2019-08-29 DIAGNOSIS — J440 Chronic obstructive pulmonary disease with acute lower respiratory infection: Secondary | ICD-10-CM | POA: Diagnosis not present

## 2019-08-29 DIAGNOSIS — J1289 Other viral pneumonia: Secondary | ICD-10-CM | POA: Diagnosis not present

## 2019-08-29 DIAGNOSIS — J189 Pneumonia, unspecified organism: Secondary | ICD-10-CM | POA: Diagnosis not present

## 2019-08-30 DIAGNOSIS — J440 Chronic obstructive pulmonary disease with acute lower respiratory infection: Secondary | ICD-10-CM | POA: Diagnosis not present

## 2019-08-30 DIAGNOSIS — J1289 Other viral pneumonia: Secondary | ICD-10-CM | POA: Diagnosis not present

## 2019-08-30 DIAGNOSIS — U071 COVID-19: Secondary | ICD-10-CM | POA: Diagnosis not present

## 2019-08-30 DIAGNOSIS — J189 Pneumonia, unspecified organism: Secondary | ICD-10-CM | POA: Diagnosis not present

## 2019-09-03 ENCOUNTER — Telehealth: Payer: Self-pay | Admitting: *Deleted

## 2019-09-03 NOTE — Telephone Encounter (Signed)
Jinny Blossom from [facility] called for contact information.

## 2019-09-04 DIAGNOSIS — Z299 Encounter for prophylactic measures, unspecified: Secondary | ICD-10-CM | POA: Diagnosis not present

## 2019-09-04 DIAGNOSIS — J1289 Other viral pneumonia: Secondary | ICD-10-CM | POA: Diagnosis not present

## 2019-09-04 DIAGNOSIS — U071 COVID-19: Secondary | ICD-10-CM | POA: Diagnosis not present

## 2019-09-04 DIAGNOSIS — Z6828 Body mass index (BMI) 28.0-28.9, adult: Secondary | ICD-10-CM | POA: Diagnosis not present

## 2019-09-04 DIAGNOSIS — Z09 Encounter for follow-up examination after completed treatment for conditions other than malignant neoplasm: Secondary | ICD-10-CM | POA: Diagnosis not present

## 2019-09-04 DIAGNOSIS — J9611 Chronic respiratory failure with hypoxia: Secondary | ICD-10-CM | POA: Diagnosis not present

## 2019-09-15 DIAGNOSIS — M545 Low back pain: Secondary | ICD-10-CM | POA: Diagnosis not present

## 2019-09-15 DIAGNOSIS — Z299 Encounter for prophylactic measures, unspecified: Secondary | ICD-10-CM | POA: Diagnosis not present

## 2019-09-15 DIAGNOSIS — I10 Essential (primary) hypertension: Secondary | ICD-10-CM | POA: Diagnosis not present

## 2019-09-15 DIAGNOSIS — Z6828 Body mass index (BMI) 28.0-28.9, adult: Secondary | ICD-10-CM | POA: Diagnosis not present

## 2019-09-15 DIAGNOSIS — Z713 Dietary counseling and surveillance: Secondary | ICD-10-CM | POA: Diagnosis not present

## 2019-09-25 DIAGNOSIS — Z299 Encounter for prophylactic measures, unspecified: Secondary | ICD-10-CM | POA: Diagnosis not present

## 2019-09-25 DIAGNOSIS — I10 Essential (primary) hypertension: Secondary | ICD-10-CM | POA: Diagnosis not present

## 2019-09-25 DIAGNOSIS — N1832 Chronic kidney disease, stage 3b: Secondary | ICD-10-CM | POA: Diagnosis not present

## 2019-09-25 DIAGNOSIS — Z6828 Body mass index (BMI) 28.0-28.9, adult: Secondary | ICD-10-CM | POA: Diagnosis not present

## 2019-09-25 DIAGNOSIS — J9611 Chronic respiratory failure with hypoxia: Secondary | ICD-10-CM | POA: Diagnosis not present

## 2019-09-25 DIAGNOSIS — E7800 Pure hypercholesterolemia, unspecified: Secondary | ICD-10-CM | POA: Diagnosis not present

## 2019-10-02 DIAGNOSIS — I10 Essential (primary) hypertension: Secondary | ICD-10-CM | POA: Diagnosis not present

## 2019-10-02 DIAGNOSIS — Z6830 Body mass index (BMI) 30.0-30.9, adult: Secondary | ICD-10-CM | POA: Diagnosis not present

## 2019-10-02 DIAGNOSIS — Z299 Encounter for prophylactic measures, unspecified: Secondary | ICD-10-CM | POA: Diagnosis not present

## 2019-10-02 DIAGNOSIS — H6123 Impacted cerumen, bilateral: Secondary | ICD-10-CM | POA: Diagnosis not present

## 2019-10-30 DIAGNOSIS — I10 Essential (primary) hypertension: Secondary | ICD-10-CM | POA: Diagnosis not present

## 2019-10-30 DIAGNOSIS — Z23 Encounter for immunization: Secondary | ICD-10-CM | POA: Diagnosis not present

## 2019-10-31 DIAGNOSIS — M159 Polyosteoarthritis, unspecified: Secondary | ICD-10-CM | POA: Diagnosis not present

## 2019-10-31 DIAGNOSIS — I10 Essential (primary) hypertension: Secondary | ICD-10-CM | POA: Diagnosis not present

## 2019-11-17 DIAGNOSIS — I10 Essential (primary) hypertension: Secondary | ICD-10-CM | POA: Diagnosis not present

## 2019-11-17 DIAGNOSIS — M159 Polyosteoarthritis, unspecified: Secondary | ICD-10-CM | POA: Diagnosis not present

## 2019-12-02 DIAGNOSIS — I10 Essential (primary) hypertension: Secondary | ICD-10-CM | POA: Diagnosis not present

## 2019-12-15 DIAGNOSIS — M159 Polyosteoarthritis, unspecified: Secondary | ICD-10-CM | POA: Diagnosis not present

## 2019-12-15 DIAGNOSIS — I10 Essential (primary) hypertension: Secondary | ICD-10-CM | POA: Diagnosis not present

## 2019-12-24 DIAGNOSIS — I10 Essential (primary) hypertension: Secondary | ICD-10-CM | POA: Diagnosis not present

## 2019-12-24 DIAGNOSIS — Z713 Dietary counseling and surveillance: Secondary | ICD-10-CM | POA: Diagnosis not present

## 2019-12-24 DIAGNOSIS — N1832 Chronic kidney disease, stage 3b: Secondary | ICD-10-CM | POA: Diagnosis not present

## 2019-12-24 DIAGNOSIS — Z299 Encounter for prophylactic measures, unspecified: Secondary | ICD-10-CM | POA: Diagnosis not present

## 2019-12-24 DIAGNOSIS — Z6827 Body mass index (BMI) 27.0-27.9, adult: Secondary | ICD-10-CM | POA: Diagnosis not present

## 2020-01-02 DIAGNOSIS — I10 Essential (primary) hypertension: Secondary | ICD-10-CM | POA: Diagnosis not present

## 2020-01-12 DIAGNOSIS — Z299 Encounter for prophylactic measures, unspecified: Secondary | ICD-10-CM | POA: Diagnosis not present

## 2020-01-12 DIAGNOSIS — I270 Primary pulmonary hypertension: Secondary | ICD-10-CM | POA: Diagnosis not present

## 2020-01-12 DIAGNOSIS — M545 Low back pain: Secondary | ICD-10-CM | POA: Diagnosis not present

## 2020-01-12 DIAGNOSIS — I272 Pulmonary hypertension, unspecified: Secondary | ICD-10-CM | POA: Diagnosis not present

## 2020-01-12 DIAGNOSIS — N1832 Chronic kidney disease, stage 3b: Secondary | ICD-10-CM | POA: Diagnosis not present

## 2020-02-01 DIAGNOSIS — M159 Polyosteoarthritis, unspecified: Secondary | ICD-10-CM | POA: Diagnosis not present

## 2020-02-01 DIAGNOSIS — I10 Essential (primary) hypertension: Secondary | ICD-10-CM | POA: Diagnosis not present

## 2020-03-03 DIAGNOSIS — I10 Essential (primary) hypertension: Secondary | ICD-10-CM | POA: Diagnosis not present

## 2020-03-03 DIAGNOSIS — M159 Polyosteoarthritis, unspecified: Secondary | ICD-10-CM | POA: Diagnosis not present

## 2020-03-18 DIAGNOSIS — Z23 Encounter for immunization: Secondary | ICD-10-CM | POA: Diagnosis not present

## 2020-04-02 DIAGNOSIS — I10 Essential (primary) hypertension: Secondary | ICD-10-CM | POA: Diagnosis not present

## 2020-04-02 DIAGNOSIS — M159 Polyosteoarthritis, unspecified: Secondary | ICD-10-CM | POA: Diagnosis not present

## 2020-04-05 DIAGNOSIS — Z125 Encounter for screening for malignant neoplasm of prostate: Secondary | ICD-10-CM | POA: Diagnosis not present

## 2020-04-05 DIAGNOSIS — R5383 Other fatigue: Secondary | ICD-10-CM | POA: Diagnosis not present

## 2020-04-05 DIAGNOSIS — I10 Essential (primary) hypertension: Secondary | ICD-10-CM | POA: Diagnosis not present

## 2020-04-05 DIAGNOSIS — Z7189 Other specified counseling: Secondary | ICD-10-CM | POA: Diagnosis not present

## 2020-04-05 DIAGNOSIS — E7800 Pure hypercholesterolemia, unspecified: Secondary | ICD-10-CM | POA: Diagnosis not present

## 2020-04-05 DIAGNOSIS — Z1331 Encounter for screening for depression: Secondary | ICD-10-CM | POA: Diagnosis not present

## 2020-04-05 DIAGNOSIS — Z299 Encounter for prophylactic measures, unspecified: Secondary | ICD-10-CM | POA: Diagnosis not present

## 2020-04-05 DIAGNOSIS — Z1339 Encounter for screening examination for other mental health and behavioral disorders: Secondary | ICD-10-CM | POA: Diagnosis not present

## 2020-04-05 DIAGNOSIS — Z0000 Encounter for general adult medical examination without abnormal findings: Secondary | ICD-10-CM | POA: Diagnosis not present

## 2020-04-05 DIAGNOSIS — N183 Chronic kidney disease, stage 3 unspecified: Secondary | ICD-10-CM | POA: Diagnosis not present

## 2020-04-05 DIAGNOSIS — Z79899 Other long term (current) drug therapy: Secondary | ICD-10-CM | POA: Diagnosis not present

## 2020-04-05 DIAGNOSIS — Z6827 Body mass index (BMI) 27.0-27.9, adult: Secondary | ICD-10-CM | POA: Diagnosis not present

## 2020-04-14 DIAGNOSIS — M159 Polyosteoarthritis, unspecified: Secondary | ICD-10-CM | POA: Diagnosis not present

## 2020-04-14 DIAGNOSIS — I10 Essential (primary) hypertension: Secondary | ICD-10-CM | POA: Diagnosis not present

## 2020-05-04 DIAGNOSIS — I10 Essential (primary) hypertension: Secondary | ICD-10-CM | POA: Diagnosis not present

## 2020-06-03 DIAGNOSIS — I10 Essential (primary) hypertension: Secondary | ICD-10-CM | POA: Diagnosis not present

## 2020-06-03 DIAGNOSIS — M159 Polyosteoarthritis, unspecified: Secondary | ICD-10-CM | POA: Diagnosis not present

## 2020-06-04 DIAGNOSIS — D649 Anemia, unspecified: Secondary | ICD-10-CM | POA: Diagnosis not present

## 2020-06-04 DIAGNOSIS — E039 Hypothyroidism, unspecified: Secondary | ICD-10-CM | POA: Diagnosis not present

## 2020-07-02 DIAGNOSIS — I10 Essential (primary) hypertension: Secondary | ICD-10-CM | POA: Diagnosis not present

## 2020-07-02 DIAGNOSIS — M159 Polyosteoarthritis, unspecified: Secondary | ICD-10-CM | POA: Diagnosis not present

## 2020-07-03 DIAGNOSIS — I10 Essential (primary) hypertension: Secondary | ICD-10-CM | POA: Diagnosis not present

## 2020-08-03 DIAGNOSIS — I10 Essential (primary) hypertension: Secondary | ICD-10-CM | POA: Diagnosis not present

## 2020-08-03 DIAGNOSIS — M159 Polyosteoarthritis, unspecified: Secondary | ICD-10-CM | POA: Diagnosis not present

## 2020-08-19 ENCOUNTER — Encounter (HOSPITAL_COMMUNITY): Payer: Self-pay | Admitting: Emergency Medicine

## 2020-08-19 ENCOUNTER — Inpatient Hospital Stay (HOSPITAL_COMMUNITY): Payer: Medicare Other

## 2020-08-19 ENCOUNTER — Emergency Department (HOSPITAL_COMMUNITY): Payer: Medicare Other

## 2020-08-19 ENCOUNTER — Inpatient Hospital Stay (HOSPITAL_COMMUNITY)
Admission: EM | Admit: 2020-08-19 | Discharge: 2020-08-21 | DRG: 065 | Disposition: A | Discharge status: Home / Self Care | Payer: Medicare Other | Attending: Internal Medicine | Admitting: Internal Medicine

## 2020-08-19 ENCOUNTER — Other Ambulatory Visit: Payer: Self-pay

## 2020-08-19 DIAGNOSIS — N179 Acute kidney failure, unspecified: Secondary | ICD-10-CM | POA: Diagnosis present

## 2020-08-19 DIAGNOSIS — I4892 Unspecified atrial flutter: Secondary | ICD-10-CM | POA: Diagnosis present

## 2020-08-19 DIAGNOSIS — Z974 Presence of external hearing-aid: Secondary | ICD-10-CM

## 2020-08-19 DIAGNOSIS — Z87891 Personal history of nicotine dependence: Secondary | ICD-10-CM

## 2020-08-19 DIAGNOSIS — I358 Other nonrheumatic aortic valve disorders: Secondary | ICD-10-CM | POA: Diagnosis present

## 2020-08-19 DIAGNOSIS — H3411 Central retinal artery occlusion, right eye: Secondary | ICD-10-CM | POA: Diagnosis not present

## 2020-08-19 DIAGNOSIS — R001 Bradycardia, unspecified: Secondary | ICD-10-CM | POA: Diagnosis present

## 2020-08-19 DIAGNOSIS — R011 Cardiac murmur, unspecified: Secondary | ICD-10-CM | POA: Diagnosis present

## 2020-08-19 DIAGNOSIS — H3581 Retinal edema: Secondary | ICD-10-CM | POA: Diagnosis not present

## 2020-08-19 DIAGNOSIS — N1330 Unspecified hydronephrosis: Secondary | ICD-10-CM | POA: Diagnosis present

## 2020-08-19 DIAGNOSIS — F40240 Claustrophobia: Secondary | ICD-10-CM | POA: Diagnosis present

## 2020-08-19 DIAGNOSIS — G453 Amaurosis fugax: Secondary | ICD-10-CM | POA: Diagnosis present

## 2020-08-19 DIAGNOSIS — H9190 Unspecified hearing loss, unspecified ear: Secondary | ICD-10-CM | POA: Diagnosis present

## 2020-08-19 DIAGNOSIS — Z87442 Personal history of urinary calculi: Secondary | ICD-10-CM | POA: Diagnosis not present

## 2020-08-19 DIAGNOSIS — Z66 Do not resuscitate: Secondary | ICD-10-CM | POA: Diagnosis present

## 2020-08-19 DIAGNOSIS — I63421 Cerebral infarction due to embolism of right anterior cerebral artery: Secondary | ICD-10-CM

## 2020-08-19 DIAGNOSIS — I071 Rheumatic tricuspid insufficiency: Secondary | ICD-10-CM | POA: Diagnosis present

## 2020-08-19 DIAGNOSIS — I639 Cerebral infarction, unspecified: Secondary | ICD-10-CM | POA: Diagnosis present

## 2020-08-19 DIAGNOSIS — N400 Enlarged prostate without lower urinary tract symptoms: Secondary | ICD-10-CM | POA: Diagnosis present

## 2020-08-19 DIAGNOSIS — H5461 Unqualified visual loss, right eye, normal vision left eye: Secondary | ICD-10-CM | POA: Diagnosis not present

## 2020-08-19 DIAGNOSIS — N1832 Chronic kidney disease, stage 3b: Secondary | ICD-10-CM | POA: Diagnosis present

## 2020-08-19 DIAGNOSIS — Z79899 Other long term (current) drug therapy: Secondary | ICD-10-CM

## 2020-08-19 DIAGNOSIS — M1990 Unspecified osteoarthritis, unspecified site: Secondary | ICD-10-CM | POA: Diagnosis present

## 2020-08-19 DIAGNOSIS — E785 Hyperlipidemia, unspecified: Secondary | ICD-10-CM | POA: Diagnosis present

## 2020-08-19 DIAGNOSIS — R29702 NIHSS score 2: Secondary | ICD-10-CM | POA: Diagnosis present

## 2020-08-19 DIAGNOSIS — H33321 Round hole, right eye: Secondary | ICD-10-CM | POA: Diagnosis not present

## 2020-08-19 DIAGNOSIS — H547 Unspecified visual loss: Secondary | ICD-10-CM | POA: Diagnosis not present

## 2020-08-19 DIAGNOSIS — I361 Nonrheumatic tricuspid (valve) insufficiency: Secondary | ICD-10-CM | POA: Diagnosis not present

## 2020-08-19 DIAGNOSIS — Z20822 Contact with and (suspected) exposure to covid-19: Secondary | ICD-10-CM | POA: Diagnosis present

## 2020-08-19 DIAGNOSIS — I10 Essential (primary) hypertension: Secondary | ICD-10-CM | POA: Diagnosis not present

## 2020-08-19 DIAGNOSIS — I129 Hypertensive chronic kidney disease with stage 1 through stage 4 chronic kidney disease, or unspecified chronic kidney disease: Secondary | ICD-10-CM | POA: Diagnosis present

## 2020-08-19 DIAGNOSIS — H35033 Hypertensive retinopathy, bilateral: Secondary | ICD-10-CM | POA: Diagnosis not present

## 2020-08-19 DIAGNOSIS — I6603 Occlusion and stenosis of bilateral middle cerebral arteries: Secondary | ICD-10-CM | POA: Diagnosis not present

## 2020-08-19 DIAGNOSIS — I340 Nonrheumatic mitral (valve) insufficiency: Secondary | ICD-10-CM | POA: Diagnosis not present

## 2020-08-19 DIAGNOSIS — I342 Nonrheumatic mitral (valve) stenosis: Secondary | ICD-10-CM | POA: Diagnosis not present

## 2020-08-19 DIAGNOSIS — I6389 Other cerebral infarction: Secondary | ICD-10-CM | POA: Diagnosis not present

## 2020-08-19 DIAGNOSIS — I350 Nonrheumatic aortic (valve) stenosis: Secondary | ICD-10-CM | POA: Diagnosis not present

## 2020-08-19 LAB — DIFFERENTIAL
Abs Immature Granulocytes: 0.02 10*3/uL (ref 0.00–0.07)
Basophils Absolute: 0.1 10*3/uL (ref 0.0–0.1)
Basophils Relative: 1 %
Eosinophils Absolute: 0.3 10*3/uL (ref 0.0–0.5)
Eosinophils Relative: 6 %
Immature Granulocytes: 0 %
Lymphocytes Relative: 17 %
Lymphs Abs: 1 10*3/uL (ref 0.7–4.0)
Monocytes Absolute: 0.5 10*3/uL (ref 0.1–1.0)
Monocytes Relative: 9 %
Neutro Abs: 3.9 10*3/uL (ref 1.7–7.7)
Neutrophils Relative %: 67 %

## 2020-08-19 LAB — RESP PANEL BY RT-PCR (FLU A&B, COVID) ARPGX2
Influenza A by PCR: NEGATIVE
Influenza B by PCR: NEGATIVE
SARS Coronavirus 2 by RT PCR: NEGATIVE

## 2020-08-19 LAB — CBC
HCT: 40.1 % (ref 39.0–52.0)
Hemoglobin: 12.9 g/dL — ABNORMAL LOW (ref 13.0–17.0)
MCH: 30.8 pg (ref 26.0–34.0)
MCHC: 32.2 g/dL (ref 30.0–36.0)
MCV: 95.7 fL (ref 80.0–100.0)
Platelets: 138 10*3/uL — ABNORMAL LOW (ref 150–400)
RBC: 4.19 MIL/uL — ABNORMAL LOW (ref 4.22–5.81)
RDW: 14.9 % (ref 11.5–15.5)
WBC: 5.8 10*3/uL (ref 4.0–10.5)
nRBC: 0 % (ref 0.0–0.2)

## 2020-08-19 LAB — COMPREHENSIVE METABOLIC PANEL
ALT: 17 U/L (ref 0–44)
AST: 24 U/L (ref 15–41)
Albumin: 4.3 g/dL (ref 3.5–5.0)
Alkaline Phosphatase: 74 U/L (ref 38–126)
Anion gap: 16 — ABNORMAL HIGH (ref 5–15)
BUN: 42 mg/dL — ABNORMAL HIGH (ref 8–23)
CO2: 19 mmol/L — ABNORMAL LOW (ref 22–32)
Calcium: 10.3 mg/dL (ref 8.9–10.3)
Chloride: 104 mmol/L (ref 98–111)
Creatinine, Ser: 2.18 mg/dL — ABNORMAL HIGH (ref 0.61–1.24)
GFR, Estimated: 29 mL/min — ABNORMAL LOW (ref >60)
Glucose, Bld: 104 mg/dL — ABNORMAL HIGH (ref 70–99)
Potassium: 4.6 mmol/L (ref 3.5–5.1)
Sodium: 139 mmol/L (ref 135–145)
Total Bilirubin: 1 mg/dL (ref 0.3–1.2)
Total Protein: 7.1 g/dL (ref 6.5–8.1)

## 2020-08-19 LAB — PROTIME-INR
INR: 1.5 — ABNORMAL HIGH (ref 0.8–1.2)
Prothrombin Time: 17.2 seconds — ABNORMAL HIGH (ref 11.4–15.2)

## 2020-08-19 LAB — CBG MONITORING, ED: Glucose-Capillary: 96 mg/dL (ref 70–99)

## 2020-08-19 LAB — APTT: aPTT: 36 seconds (ref 24–36)

## 2020-08-19 MED ORDER — ACETAMINOPHEN 650 MG RE SUPP: 650.0000 mg | RECTAL | Status: DC | PRN

## 2020-08-19 MED ORDER — METOPROLOL TARTRATE 25 MG PO TABS
12.5000 mg | ORAL_TABLET | Freq: Two times a day (BID) | ORAL | Status: DC
  Administered 2020-08-19: 22:00:00 12.5 mg via ORAL
  Filled 2020-08-19: qty 1

## 2020-08-19 MED ORDER — CLOPIDOGREL BISULFATE 300 MG PO TABS
300.0000 mg | ORAL_TABLET | Freq: Once | ORAL | Status: AC
  Administered 2020-08-19: 20:00:00 300 mg via ORAL
  Filled 2020-08-19: qty 1

## 2020-08-19 MED ORDER — STROKE: EARLY STAGES OF RECOVERY BOOK
Freq: Once | Status: DC
  Filled 2020-08-19: qty 1

## 2020-08-19 MED ORDER — CLOPIDOGREL BISULFATE 75 MG PO TABS
75.0000 mg | ORAL_TABLET | Freq: Every day | ORAL | Status: DC
  Administered 2020-08-20 – 2020-08-21 (×2): 75 mg via ORAL
  Filled 2020-08-19 (×2): qty 1

## 2020-08-19 MED ORDER — IRBESARTAN 300 MG PO TABS: 150.0000 mg | ORAL_TABLET | Freq: Every day | ORAL | Status: DC

## 2020-08-19 MED ORDER — TAMSULOSIN HCL 0.4 MG PO CAPS
0.4000 mg | ORAL_CAPSULE | Freq: Two times a day (BID) | ORAL | Status: DC
  Administered 2020-08-19 – 2020-08-21 (×4): 0.4 mg via ORAL
  Filled 2020-08-19 (×4): qty 1

## 2020-08-19 MED ORDER — SODIUM CHLORIDE 0.9 % IV SOLN: INTRAVENOUS | Status: DC

## 2020-08-19 MED ORDER — PANTOPRAZOLE SODIUM 40 MG PO TBEC
40.0000 mg | DELAYED_RELEASE_TABLET | Freq: Every evening | ORAL | Status: DC
  Administered 2020-08-19 – 2020-08-20 (×2): 40 mg via ORAL
  Filled 2020-08-19 (×2): qty 1

## 2020-08-19 MED ORDER — HYDRALAZINE HCL 25 MG PO TABS: 25.0000 mg | ORAL_TABLET | Freq: Four times a day (QID) | ORAL | Status: DC | PRN

## 2020-08-19 MED ORDER — ENOXAPARIN SODIUM 30 MG/0.3ML ~~LOC~~ SOLN
30.0000 mg | SUBCUTANEOUS | Status: DC
  Administered 2020-08-19 – 2020-08-20 (×2): 30 mg via SUBCUTANEOUS
  Filled 2020-08-19 (×2): qty 0.3

## 2020-08-19 MED ORDER — SENNOSIDES-DOCUSATE SODIUM 8.6-50 MG PO TABS: 1.0000 | ORAL_TABLET | Freq: Every evening | ORAL | Status: DC | PRN

## 2020-08-19 MED ORDER — ACETAMINOPHEN 160 MG/5ML PO SOLN: 650.0000 mg | ORAL | Status: DC | PRN

## 2020-08-19 MED ORDER — HYDROCODONE-ACETAMINOPHEN 5-325 MG PO TABS
1.0000 | ORAL_TABLET | Freq: Two times a day (BID) | ORAL | Status: DC | PRN
  Filled 2020-08-19: qty 1

## 2020-08-19 MED ORDER — LORAZEPAM 2 MG/ML IJ SOLN
1.0000 mg | Freq: Once | INTRAMUSCULAR | Status: AC
  Administered 2020-08-19: 18:00:00 1 mg via INTRAVENOUS
  Filled 2020-08-19: qty 1

## 2020-08-19 MED ORDER — FUROSEMIDE 20 MG PO TABS: 40.0000 mg | ORAL_TABLET | Freq: Every day | ORAL | Status: DC

## 2020-08-19 MED ORDER — ADULT MULTIVITAMIN W/MINERALS CH
1.0000 | ORAL_TABLET | Freq: Every evening | ORAL | Status: DC
  Administered 2020-08-19 – 2020-08-20 (×2): 1 via ORAL
  Filled 2020-08-19 (×2): qty 1

## 2020-08-19 MED ORDER — ASPIRIN EC 81 MG PO TBEC
81.0000 mg | DELAYED_RELEASE_TABLET | Freq: Every day | ORAL | Status: DC
  Administered 2020-08-19 – 2020-08-21 (×3): 81 mg via ORAL
  Filled 2020-08-19 (×3): qty 1

## 2020-08-19 MED ORDER — ACETAMINOPHEN 325 MG PO TABS: 650.0000 mg | ORAL_TABLET | ORAL | Status: DC | PRN

## 2020-08-19 NOTE — ED Triage Notes (Signed)
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 Pt sent over for further eval/neuro imaging. Pt states that vision loss began in R eye at 2100 last night, no other neuro symptoms. Ambulatory to triage, a/ox4, face symmetrical, speech clear, moves all limbs equally.
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
## 2020-08-19 NOTE — ED Notes (Signed)
Pt transported to MRI 

## 2020-08-19 NOTE — ED Notes (Signed)
Paged admitting regarding diet order
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
## 2020-08-20 ENCOUNTER — Inpatient Hospital Stay (HOSPITAL_COMMUNITY): Payer: Medicare Other

## 2020-08-20 ENCOUNTER — Other Ambulatory Visit: Payer: Self-pay

## 2020-08-20 ENCOUNTER — Encounter (HOSPITAL_COMMUNITY): Payer: Self-pay | Admitting: Internal Medicine

## 2020-08-20 DIAGNOSIS — I340 Nonrheumatic mitral (valve) insufficiency: Secondary | ICD-10-CM

## 2020-08-20 DIAGNOSIS — I6389 Other cerebral infarction: Secondary | ICD-10-CM

## 2020-08-20 DIAGNOSIS — I350 Nonrheumatic aortic (valve) stenosis: Secondary | ICD-10-CM

## 2020-08-20 DIAGNOSIS — I639 Cerebral infarction, unspecified: Secondary | ICD-10-CM

## 2020-08-20 DIAGNOSIS — I361 Nonrheumatic tricuspid (valve) insufficiency: Secondary | ICD-10-CM

## 2020-08-20 DIAGNOSIS — I342 Nonrheumatic mitral (valve) stenosis: Secondary | ICD-10-CM

## 2020-08-20 LAB — ECHOCARDIOGRAM COMPLETE
AR max vel: 1.88 cm2
AV Area VTI: 1.96 cm2
AV Area mean vel: 1.68 cm2
AV Mean grad: 6 mmHg
AV Peak grad: 10.4 mmHg
Ao pk vel: 1.61 m/s
Area-P 1/2: 3.48 cm2
Calc EF: 65.9 %
S' Lateral: 3.9 cm
Single Plane A2C EF: 75.9 %
Single Plane A4C EF: 54.5 %

## 2020-08-20 LAB — BASIC METABOLIC PANEL
Anion gap: 12 (ref 5–15)
BUN: 39 mg/dL — ABNORMAL HIGH (ref 8–23)
CO2: 21 mmol/L — ABNORMAL LOW (ref 22–32)
Calcium: 9.3 mg/dL (ref 8.9–10.3)
Chloride: 106 mmol/L (ref 98–111)
Creatinine, Ser: 1.99 mg/dL — ABNORMAL HIGH (ref 0.61–1.24)
GFR, Estimated: 32 mL/min — ABNORMAL LOW (ref >60)
Glucose, Bld: 99 mg/dL (ref 70–99)
Potassium: 3.9 mmol/L (ref 3.5–5.1)
Sodium: 139 mmol/L (ref 135–145)

## 2020-08-20 LAB — LIPID PANEL
Cholesterol: 180 mg/dL (ref 0–200)
HDL: 45 mg/dL (ref >40)
LDL Cholesterol: 115 mg/dL — ABNORMAL HIGH (ref 0–99)
Total CHOL/HDL Ratio: 4 RATIO
Triglycerides: 98 mg/dL (ref <150)
VLDL: 20 mg/dL (ref 0–40)

## 2020-08-20 LAB — HEMOGLOBIN A1C
Hgb A1c MFr Bld: 5.2 % (ref 4.8–5.6)
Mean Plasma Glucose: 102.54 mg/dL

## 2020-08-20 MED ORDER — PERFLUTREN LIPID MICROSPHERE
1.0000 mL | INTRAVENOUS | Status: AC | PRN
  Administered 2020-08-20: 14:00:00 2 mL via INTRAVENOUS
  Filled 2020-08-20: qty 10

## 2020-08-20 MED ORDER — LORAZEPAM 2 MG/ML IJ SOLN: 1.0000 mg | Freq: Once | INTRAMUSCULAR | Status: DC | PRN

## 2020-08-20 MED ORDER — SODIUM CHLORIDE 0.9 % IV SOLN: INTRAVENOUS | Status: DC

## 2020-08-20 MED ORDER — ATORVASTATIN CALCIUM 40 MG PO TABS
40.0000 mg | ORAL_TABLET | Freq: Every day | ORAL | Status: DC
  Administered 2020-08-20 – 2020-08-21 (×2): 40 mg via ORAL
  Filled 2020-08-20 (×2): qty 1

## 2020-08-20 NOTE — ED Notes (Signed)
Attempted Report 

## 2020-08-20 NOTE — Progress Notes (Signed)
STROKE TEAM PROGRESS NOTE   INTERVAL HISTORY No family is at the bedside.  Pt lying in bed, AAO x 3. 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 But otherwise, pure white. Left eye is OK. MRI neg for stroke and CUS no stenosis. However, would like to do CTA head and neck to rule out unstable plaque. MRA neck without contrast not good quality.   OBJECTIVE Vitals:   08/20/20 0222 08/20/20 0300 08/20/20 0330 08/20/20 0400  BP:  109/71 131/71 (!) 146/83  Pulse:  (!) 46 (!) 36 (!) 54  Resp:  $Remo'12 17 19  'eNpIk$ Temp: 98.1 F (36.7 C)     TempSrc: Oral     SpO2:  94% 95% 98%    CBC:  Recent Labs  Lab 08/19/20 1401  WBC 5.8  NEUTROABS 3.9  HGB 12.9*  HCT 40.1  MCV 95.7  PLT 138*    Basic Metabolic Panel:  Recent Labs  Lab 08/19/20 1401 08/20/20 0021  NA 139 139  K 4.6 3.9  CL 104 106  CO2 19* 21*  GLUCOSE 104* 99  BUN 42* 39*  CREATININE 2.18* 1.99*  CALCIUM 10.3 9.3    Lipid Panel: No results found for: CHOL, TRIG, HDL, CHOLHDL, VLDL, LDLCALC HgbA1c: No results found for: HGBA1C Urine Drug Screen: No results found for: LABOPIA, COCAINSCRNUR, LABBENZ, AMPHETMU, THCU, LABBARB  Alcohol Level No results found for: ETH  IMAGING  CT HEAD WO CONTRAST 08/19/2020 IMPRESSION:  1. No evidence of acute intracranial abnormality.  2. 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  3. Moderate chronic microvascular ischemic disease.  4. Chronic appearing right maxillary sinus disease.   MR BRAIN WO CONTRAST MR ANGIO HEAD WO CONTRAST 08/19/2020 IMPRESSION:  1. No acute intracranial abnormality.  2. Multifocal moderate stenosis of both middle cerebral arteries due to atherosclerotic disease.  3. Chronic ischemic microangiopathy.   US RENAL 08/19/2020 IMPRESSION:  Mild right hydronephrosis.   Transthoracic Echocardiogram  1. Left ventricular ejection fraction, by estimation, is 60 to 65%. The  left ventricle has normal  function. The left ventricle has no regional  wall motion abnormalities. There is mild concentric left ventricular  hypertrophy. Left ventricular diastolic  function could not be evaluated. Elevated left ventricular end-diastolic  pressure.  2. Right ventricular systolic function is moderately reduced. The right  ventricular size is not well visualized. There is severely elevated  pulmonary artery systolic pressure. The estimated right ventricular  systolic pressure is 18.2 mmHg.  3. Left atrial size was moderately dilated.  4. Right atrial size was severely dilated.  5. The mitral valve is abnormal. Mild mitral valve regurgitation. Mild  mitral stenosis. Severe mitral annular calcification.  6. Tricuspid valve regurgitation is moderate to severe.  7. The aortic valve is calcified. There is moderate calcification of the  aortic valve. There is mild thickening of the aortic valve. Aortic valve  regurgitation is trivial. Mild aortic valve stenosis.  8. The inferior vena cava is dilated in size with <50% respiratory  variability, suggesting right atrial pressure of 15 mmHg.   Bilateral Carotid Dopplers  00/00/2021 Bilateral: 1-39% ICA stenosis. Vertebral artery flow is antegrade.  ECG - Junctional rthym  56 BPM. Possible aflutter (See cardiology reading for complete details)  PHYSICAL EXAM  Temp:  [98 F (36.7 C)-98.4 F (36.9 C)] 98.2 F (36.8 C) (12/17 1006) Pulse Rate:  [36-83] 39 (12/17 1830) Resp:  [12-21] 19 (12/17 1700)  BP: (102-197)/(53-99) 151/89 (12/17 1830) SpO2:  [93 %-100 %] 98 % (12/17 1830)  General - Well nourished, well developed, in no apparent distress.  Ophthalmologic - fundi not visualized due to noncooperation.  Cardiovascular - Regular rhythm and rate.  Mental Status -  Level of arousal and orientation to time, place, and person were intact. Language including expression, naming, repetition, comprehension was assessed and found intact. Fund of  Knowledge was assessed and was intact.  Cranial Nerves II - XII - II - Visual field intact OS. 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 V - Facial sensation intact bilaterally. VII - Facial movement intact bilaterally. VIII - Hard of hearing & vestibular intact bilaterally. X - Palate elevates symmetrically. XI - Chin turning & shoulder shrug intact bilaterally. XII - Tongue protrusion intact.  Motor Strength - The patient's strength was normal in all extremities and pronator drift was absent.  Bulk was normal and fasciculations were absent.   Motor Tone - Muscle tone was assessed at the neck and appendages and was normal.  Reflexes - The patient's reflexes were symmetrical in all extremities and he had no pathological reflexes.  Sensory - Light touch, temperature/pinprick were assessed and were symmetrical.    Coordination - The patient had normal movements in the hands and feet with no ataxia or dysmetria.  Tremor was absent.  Gait and Station - deferred.  ASSESSMENT/PLAN Mr. JAMIE BELGER is a 86 y.o. male with history of heart murmur, CKD, hypertension, and arthritis who presented to [facility] ED 12/16 per outpatient opthalmology recommendation for evaluation of stroke as a cause of acute painless vision loss in Mr. Bigley right eye. He did not receive IV t-PA due to late presentation (>4.5 hours from time of onset)  R CRAO - etiology not quite clear, unstable carotid plaque vs. ? Atrial aflutter  CT head - No evidence of acute intracranial abnormality. 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 Moderate chronic microvascular ischemic disease.   MRI head - No acute intracranial abnormality. Chronic ischemic microangiopathy.  MRA head - Multifocal moderate stenosis of both middle cerebral arteries due to atherosclerotic disease.   MRA neck wo - nondiagnostic  Carotid Doppler unremarkable  Will consider CTA neck  to rule out unstable plaque at carotid bulb if patient Cre improves  2D Echo EF 60 to 65%  [employer] Virus 2 - negative  LDL - 115  HgbA1c - 5.2  ESR/CRP - pending  VTE prophylaxis - Lovenox  No antithrombotic prior to admission, now on aspirin 81 mg daily and clopidogrel 75 mg daily other Plavix load. Continue DAPT for 3 weeks and then aspirin alone.  Patient counseled to be compliant with his antithrombotic medications  Ongoing aggressive stroke risk factor management  Therapy recommendations:  pending  Disposition:  Pending  ? Aflutter  ECG - Junctional rthym 56 BPM. Possible aflutter  Discussed with Dr. Posey Pronto  Will ask cardiology to rule in/out in am  AKI on CKD  CKD - stage 3b - baseline Cre 1.6  This admission cre - 2.18->1.99   Renal US - Mild right hydronephrosis  BMP monitoring  Consider CTA neck if Cre improves  Hypertension  Home BP meds: metoprolol ; Diovan  Stable . 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 This patient's care requiresreview of multiple databases, neurological assessment, discussion with family, other specialists and medical decision making of high complexity. I discussed with Dr. Posey Pronto.    Rosalin Hawking, MD PhD Stroke Neurology 08/20/2020 6:56 PM    To contact Stroke Continuity provider, please refer  to [email or web address] After hours, contact General Neurology

## 2020-08-20 NOTE — ED Notes (Signed)
Pt sleeping  normal rise and fall of chest 

## 2020-08-20 NOTE — ED Notes (Signed)
Meal tray at bedside.  

## 2020-08-20 NOTE — Progress Notes (Signed)
Carotid completed   Please see CV Proc for preliminary results.   Fate Galanti, RVT  

## 2020-08-20 NOTE — Progress Notes (Signed)
  Echocardiogram 2D Echocardiogram with definity has been performed.  Darlina Sicilian M 08/20/2020, 2:30 PM

## 2020-08-20 NOTE — ED Notes (Signed)
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                                                +----------+--------+--------+--------+------------------+--------+ +----------+--------+--------+--------+-------------------+  PSV cm/sEDV cm/sDescribeArm Pressure (mmHG) +----------+--------+--------+--------+-------------------+ Subclavian188                                         +----------+--------+--------+--------+-------------------+ +---------+--------+--+--------+--+ VertebralPSV cm/s67EDV cm/s14 +---------+--------+--+--------+--+   Summary: Right Carotid: Velocities in the right ICA are consistent with a 1-39% stenosis. Left Carotid: Velocities in the left ICA are consistent with a 1-39% stenosis. Vertebrals:  Bilateral vertebral arteries demonstrate  antegrade flow. Subclavians: Normal flow hemodynamics were seen in bilateral subclavian              arteries. *See table(s) above for measurements and observations.  Electronically signed by Monica Martinez MD on 08/20/2020 at 4:58:15 PM.    Final     Pending Labs Unresulted Labs (From admission, onward)          Start     Ordered   08/20/20 1228  C-reactive protein  Once,   STAT        08/20/20 1227   08/20/20 1227  Sedimentation rate  Once,   STAT        08/20/20 1227          Vitals/Pain Today's Vitals   08/20/20 1100 08/20/20 1200 08/20/20 1300 08/20/20 1700  BP: (!) 162/73 (!) 157/78 (!) 197/95 (!) 151/99  Pulse:  (!) 55 (!) 41 83  Resp:  13 20 19   Temp:      TempSrc:      SpO2:  100% 93% 100%  PainSc:        Isolation Precautions No active isolations  Medications Medications  clopidogrel (PLAVIX) tablet 75 mg (75 mg Oral Given 08/20/20 1349)  aspirin EC tablet 81 mg (81 mg Oral Given 08/20/20 1350)  HYDROcodone-acetaminophen (NORCO/VICODIN) 5-325 MG per tablet 1 tablet (has no administration in time range)  pantoprazole (PROTONIX) EC tablet 40 mg (40 mg Oral Given 08/19/20 2028)  tamsulosin (FLOMAX) capsule 0.4 mg (0.4 mg Oral Given 08/20/20 1350)  multivitamin with minerals tablet 1 tablet (1 tablet Oral Given 08/19/20 2021)   stroke: mapping our early stages of recovery book ( Does not apply Not Given 08/19/20 1954)  acetaminophen (TYLENOL) tablet 650 mg (has no administration in time range)    Or  acetaminophen (TYLENOL) 160 MG/5ML solution 650 mg (has no administration in time range)    Or  acetaminophen (TYLENOL) suppository 650 mg (has no administration in time range)  senna-docusate (Senokot-S) tablet 1 tablet (has no administration in time range)  enoxaparin (LOVENOX) injection 30 mg (30 mg Subcutaneous Given 08/19/20 2024)  hydrALAZINE (APRESOLINE) tablet 25 mg (has no administration in time range)  atorvastatin (LIPITOR) tablet 40 mg (40 mg Oral  Given 08/20/20 1405)  0.9 %  sodium chloride infusion ( Intravenous Rate/Dose Change 08/20/20 1348)  perflutren lipid microspheres (DEFINITY) IV suspension (2 mLs Intravenous Given 08/20/20 1426)  LORazepam (ATIVAN) injection 1 mg (1 mg Intravenous Given 08/19/20 1759)  clopidogrel (PLAVIX) tablet 300 mg (300 mg Oral Given 08/19/20 2020)    Mobility walks Low fall risk   Focused Assessments Neuro Assessment Handoff:  Swallow screen pass? Yes    NIH Stroke Scale ( + Modified Stroke Scale Criteria)  Interval: Shift assessment Level of Consciousness (1a.)   : Alert, keenly responsive LOC Questions (1b. )   +: Answers both questions correctly LOC Commands (1c. )   + : Performs both tasks correctly Best Gaze (2. )  +:  Normal Visual (3. )  +: Complete hemianopia Facial Palsy (4. )    : Normal symmetrical movements Motor Arm, Left (5a. )   +: No drift Motor Arm, Right (5b. )   +: No drift Motor Leg, Left (6a. )   +: No drift Motor Leg, Right (6b. )   +: No drift Limb Ataxia (7. ): Absent Sensory (8. )   +: Normal, no sensory loss Best Language (9. )   +: No aphasia Dysarthria (10. ): Normal Extinction/Inattention (11.)   +: No Abnormality Modified SS Total  +: 2 Complete NIHSS TOTAL: 2 Last date known well: 08/18/20 Last time known well: 2100 Neuro Assessment: Exceptions to WDL Neuro Checks:   Initial (08/19/20 1800)  Last Documented NIHSS Modified Score: 2 (08/20/20 1345) Has TPA been given? No If patient is a Neuro Trauma and patient is going to OR before floor call report to [facility] nurse: [phone] or [phone]     R Recommendations: See Admitting Provider Note  Report given to:   Additional Notes: Currently in MRI

## 2020-08-20 NOTE — ED Notes (Signed)
Echocardiogram at bedside.

## 2020-08-20 NOTE — ED Notes (Signed)
Pt undergoing carotid ultrasound

## 2020-08-20 NOTE — ED Notes (Signed)
Breakfast ordered 

## 2020-08-20 NOTE — ED Notes (Signed)
Patient transported to MRI 

## 2020-08-20 NOTE — Progress Notes (Signed)
[facility] Progress Note  Patient: Philip Mcclain    XHB:[id number]  DOA: 08/19/2020     Date of Service: the patient was seen and examined on 08/20/2020  Brief hospital course: Past medical history of HTN, CKD 3b, BPH presents with vision loss in the right eye.  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 Currently plan is continue further work-up.  Assessment and Plan: 1.  Right CRA O Etiology not clear. Unstable carotid plaque versus atrial flutter. CT head unremarkable. MRI head no acute stroke. MRA head moderate stenosis, MRA neck nondiagnostic. Carotid Doppler unremarkable. If the patient's renal functions improve, neurology would recommend CTA to rule out unstable plaque. 2D echo 6 to 65% EF. LDL 115. No medication prior to admission. Patient was taking aspirin for primary prophylaxis and stopped taking it 1 month ago. Currently on 81 mg aspirin and 75 mg Plavix. Continue DAPT for 3 weeks and then aspirin alone. PT OT currently pending. Patient passed swallowing evaluation.  2.  Junctional rhythm Concern for atrial flutter. We will discuss with cardiology tomorrow morning. Otherwise patient will need 30-day event monitoring  3.  Acute kidney injury on CKD 3B Based on renal function around 1.6. On admission creatinine 2.18. Ultrasound renal shows mild hydronephrosis without any stone. Currently receiving IV hydration. Minor improvement in serum creatinine. We will monitor renal function, consider urology consultation will decide whether inpatient versus outpatient tomorrow.  4.  Essential hypertension Currently blood pressure stable. Holding home medication. Permissive hypertension.  5.  HLD LDL 115. On Lipitor 40. Continue.  6.  Claustrophobia As needed Ativan for the MRI.  7.  Occasional junctional bradycardia Seen on telemetry. Will monitor.  Diet: Cardiac diet DVT Prophylaxis:   enoxaparin (LOVENOX) injection 30 mg Start: 08/19/20 1815    Advance goals of care  discussion: DNR  Family Communication: no family was present at bedside, at the time of interview.   Disposition:  Status is: Inpatient  Remains inpatient appropriate because:Ongoing diagnostic testing needed not appropriate for outpatient work up and Inpatient level of care appropriate due to severity of illness   Dispo: The patient is from: Home              Anticipated d/c is to: Home              Anticipated d/c date is: 2 days              Patient currently is not medically stable to d/c.        Subjective: Continues to have vision loss complaint on the right eye.  No nausea no vomiting.  No fever no chills.  No trouble breathing.  No chest pain.  No abdominal pain.  Physical Exam:  General: Appear in no distress, no Rash; Oral Mucosa Clear, moist. no Abnormal Neck Mass Or lumps, Conjunctiva normal  Cardiovascular: S1 and S2 Present, no Murmur, Respiratory: good respiratory effort, Bilateral Air entry present and CTA, no Crackles, no wheezes Abdomen: Bowel Sound present, Soft and no tenderness Extremities: no Pedal edema Neurology: alert and oriented to time, place, and person affect appropriate. no new focal deficit, right-sided peripheral vision loss Gait not checked due to patient safety concerns  Vitals:   08/20/20 1200 08/20/20 1300 08/20/20 1700 08/20/20 1830  BP: (!) 157/78 (!) 197/95 (!) 151/99 (!) 151/89  Pulse: (!) 55 (!) 41 83 (!) 39  Resp: 13 20 19    Temp:      TempSrc:      SpO2: 100% 93%  100% 98%    Intake/Output Summary (Last 24 hours) at 08/20/2020 1926 Last data filed at 08/20/2020 1843 Gross per 24 hour  Intake 1581.5 ml  Output 750 ml  Net 831.5 ml   There were no vitals filed for this visit.  Data Reviewed: I have personally reviewed and interpreted daily labs, tele strips, imagings as discussed above. I reviewed all nursing notes, pharmacy notes, vitals, pertinent old records I have discussed plan of care as described above with RN and  patient/family.  CBC: Recent Labs  Lab 08/19/20 1401  WBC 5.8  NEUTROABS 3.9  HGB 12.9*  HCT 40.1  MCV 95.7  PLT 761*   Basic Metabolic Panel: Recent Labs  Lab 08/19/20 1401 08/20/20 0021  NA 139 139  K 4.6 3.9  CL 104 106  CO2 19* 21*  GLUCOSE 104* 99  BUN 42* 39*  CREATININE 2.18* 1.99*  CALCIUM 10.3 9.3    Studies: MR ANGIO NECK WO CONTRAST  Result Date: 08/20/2020 CLINICAL DATA:  Acute painless vision loss of the right eye. EXAM: MRA NECK WITHOUT  CONTRAST TECHNIQUE: Multiplanar and multiecho pulse sequences of the neck were obtained without intravenous contrast. Angiographic images of the neck were obtained using MRA technique without and with intravenous contrast. COMPARISON:  Head studies done yesterday. FINDINGS: Noncontrast exam suffers from motion degradation and poor detail due to the noncontrast technique. Both common carotid arteries are patent to the bifurcation regions. Particular signal loss due to technical deficiency in the region of the carotid bifurcations. I cannot accurately assess for carotid bifurcation stenosis. Beyond the bifurcation regions, the upper cervical internal carotid arteries appear patent and normal. Antegrade flow is present in both vertebral arteries. Proximal aspects cannot be evaluated because of motion and signal loss. Consider CT angiography if a better evaluation of the neck vessels is necessary. IMPRESSION: Very suboptimal study due to motion degradation and some sort of other additional artifact with signal loss in the carotid bifurcation regions. Antegrade flow can be seen in both vertebral arteries and both internal carotid arteries, but I cannot make any statement regarding stenotic disease. Consider CT angiography if better information is desired. Electronically Signed   By: Nelson Chimes M.D.   On: 08/20/2020 18:35   ECHOCARDIOGRAM COMPLETE  Result Date: 08/20/2020    ECHOCARDIOGRAM REPORT   Patient Name:   FREDICK SCHLOSSER Date of  Exam: 08/20/2020 Medical Rec #:  [id number]   Height:       70.0 in Accession #:    [id number]  Weight:       190.6 lb Date of Birth:  May 21, 1934   BSA:          2.045 m Patient Age:    86 years    BP:           197/95 mmHg Patient Gender: M           HR:           78 bpm. Exam Location:  Inpatient Procedure: 2D Echo, Color Doppler, Cardiac Doppler and Intracardiac            Opacification Agent Indications:    TIA 435.9 / G45.9  History:        Patient has prior history of Echocardiogram examinations, most                 recent 05/14/2018. Stroke, Arrythmias:Atrial Fibrillation,                 Signs/Symptoms:Murmur;  Risk Factors:Hypertension. Chronic kidney                 disease.  Sonographer:    Darlina Sicilian RDCS Referring Phys: [id number] [place]  1. Left ventricular ejection fraction, by estimation, is 60 to 65%. The left ventricle has normal function. The left ventricle has no regional wall motion abnormalities. There is mild concentric left ventricular hypertrophy. Left ventricular diastolic function could not be evaluated. Elevated left ventricular end-diastolic pressure.  2. Right ventricular systolic function is moderately reduced. The right ventricular size is not well visualized. There is severely elevated pulmonary artery systolic pressure. The estimated right ventricular systolic pressure is 76.1 mmHg.  3. Left atrial size was moderately dilated.  4. Right atrial size was severely dilated.  5. The mitral valve is abnormal. Mild mitral valve regurgitation. Mild mitral stenosis. Severe mitral annular calcification.  6. Tricuspid valve regurgitation is moderate to severe.  7. The aortic valve is calcified. There is moderate calcification of the aortic valve. There is mild thickening of the aortic valve. Aortic valve regurgitation is trivial. Mild aortic valve stenosis.  8. The inferior vena cava is dilated in size with <50% respiratory variability, suggesting right atrial pressure of 15  mmHg. Comparison(s): Changes from prior study are noted. Conclusion(s)/Recommendation(s): No intracardiac source of embolism detected on this transthoracic study. A transesophageal echocardiogram is recommended to exclude cardiac source of embolism if clinically indicated. Right sided pressures now severely elevated compared to prior. FINDINGS  Left Ventricle: Left ventricular ejection fraction, by estimation, is 60 to 65%. The left ventricle has normal function. The left ventricle has no regional wall motion abnormalities. Definity contrast agent was given IV to delineate the left ventricular  endocardial borders. The left ventricular internal cavity size was normal in size. There is mild concentric left ventricular hypertrophy. Left ventricular diastolic function could not be evaluated due to atrial fibrillation. Left ventricular diastolic function could not be evaluated. Elevated left ventricular end-diastolic pressure. Right Ventricle: The right ventricular size is not well visualized. Right vetricular wall thickness was not well visualized. Right ventricular systolic function is moderately reduced. There is severely elevated pulmonary artery systolic pressure. The tricuspid regurgitant velocity is 4.41 m/s, and with an assumed right atrial pressure of 15 mmHg, the estimated right ventricular systolic pressure is 95.0 mmHg. Left Atrium: Left atrial size was moderately dilated. Right Atrium: Right atrial size was severely dilated. Pericardium: There is no evidence of pericardial effusion. Mitral Valve: The mitral valve is abnormal. There is moderate thickening of the mitral valve leaflet(s). There is moderate calcification of the mitral valve leaflet(s). Severe mitral annular calcification. Mild mitral valve regurgitation. Mild mitral valve stenosis. MV peak gradient, 19.7 mmHg. The mean mitral valve gradient is 6.8 mmHg. Tricuspid Valve: The tricuspid valve is normal in structure. Tricuspid valve regurgitation  is moderate to severe. Aortic Valve: The aortic valve is calcified. There is moderate calcification of the aortic valve. There is mild thickening of the aortic valve. There is moderate aortic valve annular calcification. Aortic valve regurgitation is trivial. Mild aortic stenosis is present. Aortic valve mean gradient measures 6.0 mmHg. Aortic valve peak gradient measures 10.4 mmHg. Aortic valve area, by VTI measures 1.96 cm. Pulmonic Valve: The pulmonic valve was not well visualized. Pulmonic valve regurgitation is trivial. Aorta: The aortic root, ascending aorta and aortic arch are all structurally normal, with no evidence of dilitation or obstruction. Venous: The inferior vena cava is dilated in size with less than 50% respiratory  variability, suggesting right atrial pressure of 15 mmHg. IAS/Shunts: The atrial septum is grossly normal.  LEFT VENTRICLE PLAX 2D LVIDd:         5.30 cm      Diastology LVIDs:         3.90 cm      LV e' medial:    5.63 cm/s LV PW:         1.30 cm      LV E/e' medial:  33.9 LV IVS:        1.10 cm      LV e' lateral:   9.01 cm/s LVOT diam:     1.80 cm      LV E/e' lateral: 21.2 LV SV:         56 LV SV Index:   28 LVOT Area:     2.54 cm  LV Volumes (MOD) LV vol d, MOD A2C: 124.0 ml LV vol d, MOD A4C: 116.0 ml LV vol s, MOD A2C: 29.9 ml LV vol s, MOD A4C: 52.8 ml LV SV MOD A2C:     94.1 ml LV SV MOD A4C:     116.0 ml LV SV MOD BP:      80.7 ml RIGHT VENTRICLE TAPSE (M-mode): 1.4 cm LEFT ATRIUM             Index       RIGHT ATRIUM           Index LA diam:        4.20 cm 2.05 cm/m  RA Area:     28.10 cm LA Vol (A2C):   75.6 ml 36.97 ml/m RA Volume:   90.30 ml  44.16 ml/m LA Vol (A4C):   90.4 ml 44.20 ml/m LA Biplane Vol: 89.9 ml 43.96 ml/m  AORTIC VALVE AV Area (Vmax):    1.88 cm AV Area (Vmean):   1.68 cm AV Area (VTI):     1.96 cm AV Vmax:           161.00 cm/s AV Vmean:          108.000 cm/s AV VTI:            0.288 m AV Peak Grad:      10.4 mmHg AV Mean Grad:      6.0 mmHg  LVOT Vmax:         119.00 cm/s LVOT Vmean:        71.100 cm/s LVOT VTI:          0.222 m LVOT/AV VTI ratio: 0.77  AORTA Ao Root diam: 3.80 cm MITRAL VALVE                TRICUSPID VALVE MV Area (PHT): 3.48 cm     TR Peak grad:   77.8 mmHg MV Peak grad:  19.7 mmHg    TR Vmax:        441.00 cm/s MV Mean grad:  6.8 mmHg MV Vmax:       2.22 m/s     SHUNTS MV Vmean:      114.4 cm/s   Systemic VTI:  0.22 m MV Decel Time: 218 msec     Systemic Diam: 1.80 cm MV E velocity: 191.00 cm/s Buford Dresser MD Electronically signed by Buford Dresser MD Signature Date/Time: 08/20/2020/4:43:10 PM    Final    VAS US CAROTID  Result Date: 08/20/2020 Carotid Arterial Duplex Study Indications:       CVA. Risk Factors:  Hypertension. Comparison Study:  No previous exam Performing Technologist: Vonzell Schlatter RVT  Examination Guidelines: A complete evaluation includes B-mode imaging, spectral Doppler, color Doppler, and power Doppler as needed of all accessible portions of each vessel. Bilateral testing is considered an integral part of a complete examination. Limited examinations for reoccurring indications may be performed as noted.  Right Carotid Findings: +----------+--------+--------+--------+------------------+--------+           PSV cm/sEDV cm/sStenosisPlaque DescriptionComments +----------+--------+--------+--------+------------------+--------+ CCA Prox  63      11                                         +----------+--------+--------+--------+------------------+--------+ CCA Distal58      8                                          +----------+--------+--------+--------+------------------+--------+ ICA Prox  72      13      1-39%   heterogenous               +----------+--------+--------+--------+------------------+--------+ ICA Distal98      22                                         +----------+--------+--------+--------+------------------+--------+ ECA       128                                                 +----------+--------+--------+--------+------------------+--------+ +----------+--------+-------+--------+-------------------+           PSV cm/sEDV cmsDescribeArm Pressure (mmHG) +----------+--------+-------+--------+-------------------+ [id number]                                         +----------+--------+-------+--------+-------------------+ +---------+--------+--+--------+--+ VertebralPSV cm/s61EDV cm/s13 +---------+--------+--+--------+--+  Left Carotid Findings: +----------+--------+--------+--------+------------------+--------+           PSV cm/sEDV cm/sStenosisPlaque DescriptionComments +----------+--------+--------+--------+------------------+--------+ CCA Prox  88      12                                         +----------+--------+--------+--------+------------------+--------+ CCA Distal46      9                                          +----------+--------+--------+--------+------------------+--------+ ICA Prox  115     33      1-39%   heterogenous               +----------+--------+--------+--------+------------------+--------+ ICA Distal83      23                                         +----------+--------+--------+--------+------------------+--------+ ECA       128                                                +----------+--------+--------+--------+------------------+--------+ +----------+--------+--------+--------+-------------------+  PSV cm/sEDV cm/sDescribeArm Pressure (mmHG) +----------+--------+--------+--------+-------------------+ Subclavian188                                         +----------+--------+--------+--------+-------------------+ +---------+--------+--+--------+--+ VertebralPSV cm/s67EDV cm/s14 +---------+--------+--+--------+--+   Summary: Right Carotid: Velocities in the right ICA are consistent with a 1-39% stenosis. Left Carotid: Velocities in  the left ICA are consistent with a 1-39% stenosis. Vertebrals:  Bilateral vertebral arteries demonstrate antegrade flow. Subclavians: Normal flow hemodynamics were seen in bilateral subclavian              arteries. *See table(s) above for measurements and observations.  Electronically signed by Monica Martinez MD on 08/20/2020 at 4:58:15 PM.    Final     Scheduled Meds: .  stroke: mapping our early stages of recovery book   Does not apply Once  . aspirin EC  81 mg Oral Daily  . atorvastatin  40 mg Oral Daily  . clopidogrel  75 mg Oral Daily  . enoxaparin (LOVENOX) injection  30 mg Subcutaneous Q24H  . multivitamin with minerals  1 tablet Oral QPM  . pantoprazole  40 mg Oral QPM  . tamsulosin  0.4 mg Oral BID   Continuous Infusions: . sodium chloride 75 mL/hr at 08/20/20 1348   PRN Meds: acetaminophen **OR** acetaminophen (TYLENOL) oral liquid 160 mg/5 mL **OR** acetaminophen, hydrALAZINE, HYDROcodone-acetaminophen, LORazepam, senna-docusate  Time spent: 35 minutes  Author: Berle Mull, MD [facility] 08/20/2020 7:26 PM  To reach On-call, see care teams to locate the attending and reach out via [email or web address]. Between 7PM-7AM, please contact night-coverage If you still have difficulty reaching the attending provider, please page the [facility] (Director on Call) for [facility] on amion for assistance.

## 2020-08-20 NOTE — ED Notes (Signed)
Pt repositioned

## 2020-08-21 DIAGNOSIS — I63421 Cerebral infarction due to embolism of right anterior cerebral artery: Secondary | ICD-10-CM

## 2020-08-21 LAB — RENAL FUNCTION PANEL
Albumin: 3.2 g/dL — ABNORMAL LOW (ref 3.5–5.0)
Anion gap: 9 (ref 5–15)
BUN: 34 mg/dL — ABNORMAL HIGH (ref 8–23)
CO2: 21 mmol/L — ABNORMAL LOW (ref 22–32)
Calcium: 9.3 mg/dL (ref 8.9–10.3)
Chloride: 108 mmol/L (ref 98–111)
Creatinine, Ser: 1.86 mg/dL — ABNORMAL HIGH (ref 0.61–1.24)
GFR, Estimated: 35 mL/min — ABNORMAL LOW (ref >60)
Glucose, Bld: 100 mg/dL — ABNORMAL HIGH (ref 70–99)
Phosphorus: 2.4 mg/dL — ABNORMAL LOW (ref 2.5–4.6)
Potassium: 4.1 mmol/L (ref 3.5–5.1)
Sodium: 138 mmol/L (ref 135–145)

## 2020-08-21 LAB — CBC
HCT: 29.9 % — ABNORMAL LOW (ref 39.0–52.0)
Hemoglobin: 10.7 g/dL — ABNORMAL LOW (ref 13.0–17.0)
MCH: 32.3 pg (ref 26.0–34.0)
MCHC: 35.8 g/dL (ref 30.0–36.0)
MCV: 90.3 fL (ref 80.0–100.0)
Platelets: 106 10*3/uL — ABNORMAL LOW (ref 150–400)
RBC: 3.31 MIL/uL — ABNORMAL LOW (ref 4.22–5.81)
RDW: 14.7 % (ref 11.5–15.5)
WBC: 5 10*3/uL (ref 4.0–10.5)
nRBC: 0 % (ref 0.0–0.2)

## 2020-08-21 LAB — MAGNESIUM: Magnesium: 1.8 mg/dL (ref 1.7–2.4)

## 2020-08-21 LAB — SEDIMENTATION RATE: Sed Rate: 14 mm/hr (ref 0–16)

## 2020-08-21 LAB — C-REACTIVE PROTEIN: CRP: 0.6 mg/dL (ref <1.0)

## 2020-08-21 MED ORDER — ATORVASTATIN CALCIUM 40 MG PO TABS: 40.0000 mg | ORAL_TABLET | Freq: Every day | ORAL | 0 refills | Status: AC

## 2020-08-21 MED ORDER — ASPIRIN 81 MG PO TBEC: 81.0000 mg | DELAYED_RELEASE_TABLET | Freq: Every day | ORAL | 11 refills | Status: DC

## 2020-08-21 MED ORDER — CLOPIDOGREL BISULFATE 75 MG PO TABS: 75.0000 mg | ORAL_TABLET | Freq: Every day | ORAL | 0 refills | Status: AC

## 2020-08-21 NOTE — Plan of Care (Signed)
  Problem: Education: Goal: Knowledge of General Education information will improve Description: Including pain rating scale, medication(s)/side effects and non-pharmacologic comfort measures Outcome: Adequate for Discharge   Problem: Health Behavior/Discharge Planning: Goal: Ability to manage health-related needs will improve Outcome: Adequate for Discharge   Problem: Clinical Measurements: Goal: Ability to maintain clinical measurements within normal limits will improve Outcome: Adequate for Discharge Goal: Will remain free from infection Outcome: Adequate for Discharge Goal: Diagnostic test results will improve Outcome: Adequate for Discharge Goal: Respiratory complications will improve Outcome: Adequate for Discharge Goal: Cardiovascular complication will be avoided Outcome: Adequate for Discharge   Problem: Activity: Goal: Risk for activity intolerance will decrease Outcome: Adequate for Discharge   Problem: Nutrition: Goal: Adequate nutrition will be maintained Outcome: Adequate for Discharge   Problem: Coping: Goal: Level of anxiety will decrease Outcome: Adequate for Discharge   Problem: Elimination: Goal: Will not experience complications related to bowel motility Outcome: Adequate for Discharge Goal: Will not experience complications related to urinary retention Outcome: Adequate for Discharge   Problem: Pain Managment: Goal: General experience of comfort will improve Outcome: Adequate for Discharge   Problem: Safety: Goal: Ability to remain free from injury will improve Outcome: Adequate for Discharge   Problem: Skin Integrity: Goal: Risk for impaired skin integrity will decrease Outcome: Adequate for Discharge   Problem: Education: Goal: Knowledge of disease or condition will improve Outcome: Adequate for Discharge Goal: Knowledge of secondary prevention will improve Outcome: Adequate for Discharge Goal: Knowledge of patient specific risk factors  addressed and post discharge goals established will improve Outcome: Adequate for Discharge Goal: Individualized Educational Video(s) Outcome: Adequate for Discharge   Problem: Coping: Goal: Will verbalize positive feelings about self Outcome: Adequate for Discharge Goal: Will identify appropriate support needs Outcome: Adequate for Discharge   Problem: Health Behavior/Discharge Planning: Goal: Ability to manage health-related needs will improve Outcome: Adequate for Discharge   Problem: Self-Care: Goal: Ability to participate in self-care as condition permits will improve Outcome: Adequate for Discharge Goal: Verbalization of feelings and concerns over difficulty with self-care will improve Outcome: Adequate for Discharge Goal: Ability to communicate needs accurately will improve Outcome: Adequate for Discharge   Problem: Nutrition: Goal: Risk of aspiration will decrease Outcome: Adequate for Discharge Goal: Dietary intake will improve Outcome: Adequate for Discharge   Problem: Intracerebral Hemorrhage Tissue Perfusion: Goal: Complications of Intracerebral Hemorrhage will be minimized Outcome: Adequate for Discharge   Problem: Ischemic Stroke/TIA Tissue Perfusion: Goal: Complications of ischemic stroke/TIA will be minimized Outcome: Adequate for Discharge   Problem: Spontaneous Subarachnoid Hemorrhage Tissue Perfusion: Goal: Complications of Spontaneous Subarachnoid Hemorrhage will be minimized Outcome: Adequate for Discharge   

## 2020-08-21 NOTE — Evaluation (Signed)
Physical Therapy Evaluation Patient Details Name: Philip Mcclain MRN: [id number] DOB: [birth year] Today's Date: 08/21/2020   History of Present Illness  Philip Mcclain is a 86 y.o. male with medical history significant of HTN, " irregular heartbeat", CKD stage II, BPH, presented with vision loss on the right eye.  Symptoms started last night while watching TV, all of a sudden, patient started to feel the right sided vision became very bright with shiny stars, initially he thought it was the lamp on the side table, but symptoms persisted after turndown the lamp.  He then went to sleep, but woke up this morning he found out he could not see anything on the right eye anymore.  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  His ophthalmologist sent him to the ED.  Clinical Impression  Patient received in bed, agreeable to PT session. He reports he is feeling good and wants to go home. He is mod independent with bed mobility. Transfers with min guard/supervision. Patient ambulated 120 feet with SPC and min guard. He is unsteady and reaching out for rail/furniture in room and hallway. Improved balance as distance improved. He will continue to benefit from skilled PT while here to improve mobility, safety and independence.         Follow Up Recommendations No PT follow up    Equipment Recommendations  None recommended by PT    Recommendations for Other Services       Precautions / Restrictions Precautions Precaution Comments: mod fall Restrictions Weight Bearing Restrictions: No      Mobility  Bed Mobility Overal bed mobility: Modified Independent                  Transfers Overall transfer level: Needs assistance Equipment used: Straight cane Transfers: Sit to/from Stand;Stand Pivot Transfers Sit to Stand: Supervision Stand pivot transfers: Supervision       General transfer comment: pain with initial standing, reports he has  sciatica  Ambulation/Gait Ambulation/Gait assistance: Min guard Gait Distance (Feet): 120 Feet Assistive device: Straight cane Gait Pattern/deviations: Step-through pattern;Decreased step length - right;Decreased step length - left;Trunk flexed Gait velocity: decr   General Gait Details: patient slightly unsteady initially, reaching out with arm for support on rail, counter, chairs, etc. As distance increased he became more steady with mobility  Stairs            Wheelchair Mobility    Modified Rankin (Stroke Patients Only)       Balance Overall balance assessment: Needs assistance Sitting-balance support: Feet supported Sitting balance-Leahy Scale: Good     Standing balance support: Bilateral upper extremity supported;During functional activity Standing balance-Leahy Scale: Fair Standing balance comment: reliant on B UE support at this time, has walker at home if needed                             Pertinent Vitals/Pain Pain Assessment: No/denies pain    Home Living Family/patient expects to be discharged to:: Private residence Living Arrangements: Spouse/significant other Available Help at Discharge: Family;Available PRN/intermittently Type of Home: House         Home Equipment: Kasandra Knudsen - single point;Walker - 2 wheels Additional Comments: uses cane    Prior Function Level of Independence: Independent with assistive device(s)         Comments: drives     Hand Dominance        Extremity/Trunk Assessment  Upper Extremity Assessment Upper Extremity Assessment: Overall WFL for tasks assessed    Lower Extremity Assessment Lower Extremity Assessment: Overall WFL for tasks assessed    Cervical / Trunk Assessment Cervical / Trunk Assessment: Kyphotic  Communication   Communication: HOH  Cognition Arousal/Alertness: Awake/alert Behavior During Therapy: WFL for tasks assessed/performed Overall Cognitive Status: Within Functional Limits  for tasks assessed                                        General Comments      Exercises     Assessment/Plan    PT Assessment Patient needs continued PT services  PT Problem List Decreased strength;Decreased mobility;Decreased activity tolerance;Decreased balance       PT Treatment Interventions Therapeutic activities;Gait training;Therapeutic exercise;Functional mobility training;Balance training;Patient/family education    PT Goals (Current goals can be found in the Care Plan section)  Acute Rehab PT Goals Patient Stated Goal: to go home today hopefully PT Goal Formulation: With patient Time For Goal Achievement: 08/27/20 Potential to Achieve Goals: Good    Frequency Min 3X/week   Barriers to discharge        Co-evaluation               AM-PAC PT "6 Clicks" Mobility  Outcome Measure Help needed turning from your back to your side while in a flat bed without using bedrails?: None Help needed moving from lying on your back to sitting on the side of a flat bed without using bedrails?: None Help needed moving to and from a bed to a chair (including a wheelchair)?: A Little Help needed standing up from a chair using your arms (e.g., wheelchair or bedside chair)?: A Little Help needed to walk in hospital room?: A Little Help needed climbing 3-5 steps with a railing? : A Little 6 Click Score: 20    End of Session Equipment Utilized During Treatment: Gait belt Activity Tolerance: Patient tolerated treatment well Patient left: in chair;with call bell/phone within reach Nurse Communication: Mobility status PT Visit Diagnosis: Muscle weakness (generalized) (M62.81);Unsteadiness on feet (R26.81);Difficulty in walking, not elsewhere classified (R26.2)    Time: 1030-1105 PT Time Calculation (min) (ACUTE ONLY): 35 min   Charges:   PT Evaluation $PT Eval Moderate Complexity: 1 Mod PT Treatments $Gait Training: 8-22 mins        Ericberto Padget,  PT, [facility] 08/21/20,11:20 AM

## 2020-08-21 NOTE — Progress Notes (Signed)
STROKE TEAM PROGRESS NOTE   INTERVAL HISTORY No family is at the bedside.  Pt lying in bed, AAO x 3. 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 But otherwise, pure white, can maybe see light coming from the door. Left eye is OK. MRI neg for stroke and CUS no stenosis. Cannot have CTA, MRA of the head and neck did not show any unstable clots.   OBJECTIVE Vitals:   08/20/20 1700 08/20/20 1830 08/20/20 2018 08/21/20 0612  BP: (!) 151/99 (!) 151/89 (!) 109/93 (!) 161/76  Pulse: 83 (!) 39 70 72  Resp: $Remo'19  20 20  'WeChL$ Temp:   98.6 F (37 C) 98.4 F (36.9 C)  TempSrc:   Oral Oral  SpO2: 100% 98% 100% 97%    CBC:  Recent Labs  Lab 08/19/20 1401 08/21/20 0354  WBC 5.8 5.0  NEUTROABS 3.9  --   HGB 12.9* 10.7*  HCT 40.1 29.9*  MCV 95.7 90.3  PLT 138* 106*    Basic Metabolic Panel:  Recent Labs  Lab 08/20/20 0021 08/21/20 0354  NA 139 138  K 3.9 4.1  CL 106 108  CO2 21* 21*  GLUCOSE 99 100*  BUN 39* 34*  CREATININE 1.99* 1.86*  CALCIUM 9.3 9.3  MG  --  1.8  PHOS  --  2.4*    Lipid Panel:     Component Value Date/Time   CHOL 180 08/19/2020 1401   TRIG 98 08/19/2020 1401   HDL 45 08/19/2020 1401   CHOLHDL 4.0 08/19/2020 1401   VLDL 20 08/19/2020 1401   LDLCALC 115 (H) 08/19/2020 1401   HgbA1c:  Lab Results  Component Value Date   HGBA1C 5.2 08/19/2020   Urine Drug Screen: No results found for: LABOPIA, COCAINSCRNUR, LABBENZ, AMPHETMU, THCU, LABBARB  Alcohol Level No results found for: ETH  IMAGING  CT HEAD WO CONTRAST 08/19/2020 IMPRESSION:  1. No evidence of acute intracranial abnormality.  2. 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  3. Moderate chronic microvascular ischemic disease.  4. Chronic appearing right maxillary sinus disease.   MR BRAIN WO CONTRAST MR ANGIO HEAD WO CONTRAST 08/19/2020 IMPRESSION:  1. No acute intracranial abnormality.  2. Multifocal moderate stenosis of both middle  cerebral arteries due to atherosclerotic disease.  3. Chronic ischemic microangiopathy.   US RENAL 08/19/2020 IMPRESSION:  Mild right hydronephrosis.   Transthoracic Echocardiogram  1. Left ventricular ejection fraction, by estimation, is 60 to 65%. The  left ventricle has normal function. The left ventricle has no regional  wall motion abnormalities. There is mild concentric left ventricular  hypertrophy. Left ventricular diastolic  function could not be evaluated. Elevated left ventricular end-diastolic  pressure.  2. Right ventricular systolic function is moderately reduced. The right  ventricular size is not well visualized. There is severely elevated  pulmonary artery systolic pressure. The estimated right ventricular  systolic pressure is 68.1 mmHg.  3. Left atrial size was moderately dilated.  4. Right atrial size was severely dilated.  5. The mitral valve is abnormal. Mild mitral valve regurgitation. Mild  mitral stenosis. Severe mitral annular calcification.  6. Tricuspid valve regurgitation is moderate to severe.  7. The aortic valve is calcified. There is moderate calcification of the  aortic valve. There is mild thickening of the aortic valve. Aortic valve  regurgitation is trivial. Mild aortic valve stenosis.  8. The inferior vena cava is dilated in size with <50% respiratory  variability, suggesting right atrial pressure of 15 mmHg.   Bilateral Carotid Dopplers  00/00/2021 Bilateral: 1-39% ICA stenosis. Vertebral artery flow is antegrade.  ECG - Junctional rthym  56 BPM. Possible aflutter (See cardiology reading for complete details)  PHYSICAL EXAM  Temp:  [98.2 F (36.8 C)-98.6 F (37 C)] 98.4 F (36.9 C) (12/18 0612) Pulse Rate:  [39-83] 72 (12/18 0612) Resp:  [12-20] 20 (12/18 0612) BP: (109-197)/(73-99) 161/76 (12/18 0612) SpO2:  [93 %-100 %] 97 % (12/18 0612)  General - Well nourished, well developed, in no apparent distress.  Ophthalmologic  - fundi not visualized due to noncooperation.  Cardiovascular - Regular rhythm and rate.  Mental Status -  Level of arousal and orientation to time, place, and person were intact. Language including expression, naming, repetition, comprehension was assessed and found intact. Fund of Knowledge was assessed and was intact.  Cranial Nerves II - XII - II - Visual field intact OS. 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 V - Facial sensation intact bilaterally. VII - Facial movement intact bilaterally. VIII - Hard of hearing & vestibular intact bilaterally. X - Palate elevates symmetrically. XI - Chin turning & shoulder shrug intact bilaterally. XII - Tongue protrusion intact.  Motor Strength - The patient's strength was normal in all extremities and pronator drift was absent.  Bulk was normal and fasciculations were absent.   Motor Tone - Muscle tone was assessed at the neck and appendages and was normal.  Reflexes - The patient's reflexes were symmetrical in all extremities and he had no pathological reflexes.  Sensory - Light touch, temperature/pinprick were assessed and were symmetrical.    Coordination - The patient had normal movements in the hands and feet with no ataxia or dysmetria.  Tremor was absent.  Gait and Station - deferred.  ASSESSMENT/PLAN Mr. Philip Mcclain is a 86 y.o. male with history of heart murmur, CKD, hypertension, and arthritis who presented to [facility] ED 12/16 per outpatient opthalmology recommendation for evaluation of stroke as a cause of acute painless vision loss in Mr. Teale right eye. He did not receive IV t-PA due to late presentation (>4.5 hours from time of onset)  R CRAO - etiology not quite clear  CT head - No evidence of acute intracranial abnormality. 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 Moderate chronic microvascular ischemic disease.   MRI head - No acute intracranial  abnormality. Chronic ischemic microangiopathy.  MRA head - Multifocal moderate stenosis of both middle cerebral arteries due to atherosclerotic disease.   MRA neck wo - nondiagnostic  Carotid Doppler unremarkable  Cannot have CTA H&N due to creatinine, consider having it outpatient if creatinine improves  2D Echo EF 60 to 65%  [employer] Virus 2 - negative  LDL - 115  HgbA1c - 5.2  ESR/CRP - normal   VTE prophylaxis - Lovenox  No antithrombotic prior to admission, now on aspirin 81 mg daily and clopidogrel 75 mg daily other Plavix load. Continue DAPT for 3 weeks and then aspirin alone.  Patient counseled to be compliant with his antithrombotic medications  Ongoing aggressive stroke risk factor management  Therapy recommendations:  pending  Disposition:  Pending  ? Aflutter  ECG - Junctional rthym 56 BPM. Possible aflutter  Discussed with Dr. Posey Pronto  Dr Posey Pronto will consult with Cardiology to determine if pt has atrial flutter / fibrillation - if so will need to address possible anticoagulation. Otherwise may  need 30-day holter monitor outpatient or loop recorder.   AKI on CKD  CKD - stage 3b - baseline Cre 1.6  This admission cre - 2.18->1.99   Renal US - Mild right hydronephrosis  BMP monitoring  Consider CTA neck if Cre improves, outpatient  Hypertension  Home BP meds: metoprolol ; Diovan  Stable . 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 Have him follow up at [facility] with Dr. Leonie Man or Janett Billow stroke nurse, thank you.  Personally examined patient  and images, and have participated in and made any corrections needed to history, physical, neuro exam,assessment and plan as stated above.  I have personally obtained the history, evaluated lab date, reviewed imaging studies and agree with radiology interpretations.    Sarina Ill, MD Stroke Neurology  I spent 25 minutes of face-to-face and non-face-to-face time with patient. This included prechart review, lab review, study review, order entry, electronic health record documentation, patient education on the different diagnostic and therapeutic options, counseling and coordination of care, risks and benefits of management, compliance, or risk factor reduction       To contact Stroke Continuity provider, please refer to [email or web address] After hours, contact General Neurology

## 2020-08-21 NOTE — Evaluation (Signed)
Occupational Therapy Evaluation and Discharge Patient Details Name: Philip Mcclain MRN: [id number] DOB: 27-Feb-1934 Today's Date: 08/21/2020    History of Present Illness Philip Mcclain is a 86 y.o. male with medical history significant of HTN, " irregular heartbeat", CKD stage II, BPH, presented with vision loss on the right eye.  Symptoms started last night while watching TV, all of a sudden, patient started to feel the right sided vision became very bright with shiny stars, initially he thought it was the lamp on the side table, but symptoms persisted after turndown the lamp.  He then went to sleep, but woke up this morning he found out he could not see anything on the right eye anymore.  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  His ophthalmologist sent him to the ED. DE:YCXK remote appearing infarct in the right caudate; MRI: negative   Clinical Impression   This 86 yo male admitted with above presents to acute OT with new right eye close to total vision loss and is compensating well in this hospital environment which is unfamiliar to him and feel he will do well in his own environment that is familiar to him. We talked about safety concerns with this new vision loss and he verbalizes understanding. No further OT needs, we will sign off.    Follow Up Recommendations  No OT follow up;Supervision - Intermittent    Equipment Recommendations  None recommended by OT       Precautions / Restrictions Precautions Precautions: Fall Precaution Comments: due to decreased vision right eye; however pt is compensating really well Restrictions Weight Bearing Restrictions: No      Mobility Bed Mobility             General bed mobility comments: Pt in recliner upon my entering room    Transfers Overall transfer level: Needs assistance Equipment used:  (walking stick (his)) Transfers: Sit to/from Stand Sit to Stand: Supervision        General transfer comment: S to ambulate around the unit with tendency to hold walking stick in right hand and hand rail on left side. When asked to try and not hold the handrail on left side he was able to all the way back to his room (~75ft)    Balance Overall balance assessment: Mild deficits observed, not formally tested                             ADL either performed or assessed with clinical judgement   ADL Overall ADL's : Modified independent                                       General ADL Comments: We did talk about safety in the kitchen when stove is on his right side and burners are being used. We also talked about being very carefull with going up and down steps due to depth perception issues with only vision from one eye.     Vision Baseline Vision/History: No visual deficits Patient Visual Report:  (almost total vision loss in right eye--can see some light to the far right per his report)              Pertinent Vitals/Pain Pain Assessment: No/denies pain     Hand Dominance Right   Extremity/Trunk Assessment Upper Extremity  Assessment Upper Extremity Assessment: Overall WFL for tasks assessed     Communication Communication Communication: HOH   Cognition Arousal/Alertness: Awake/alert Behavior During Therapy: WFL for tasks assessed/performed Overall Cognitive Status: Within Functional Limits for tasks assessed                                        Exercises Other Exercises Other Exercises: had pt do pen and paper tasks. He did not have any issues visually with line-bisection (he did at times have to move back from the paper on the table more to see the whole line per his report, but he was able to do it), underlined letter reading, drawing a line from one column to another connecting letters called out to him, and copying information from one side of the page to a line on opposite side of page.        Home  Living Family/patient expects to be discharged to:: Private residence Living Arrangements: Spouse/significant other Available Help at Discharge: Family;Available 24 hours/day Type of Home: House Home Access: Ramped entrance     Home Layout: One level     Bathroom Shower/Tub: Occupational psychologist: Standard     Home Equipment: Cane - single point;Walker - 2 wheels;Grab bars - tub/shower;Grab bars - toilet   Additional Comments: uses cane and walking stick (in his room)      Prior Functioning/Environment Level of Independence: Independent with assistive device(s)        Comments: drives (we did discuss that it would be unsafe for him to drive with the new onset of vision loss in his right eye)        OT Problem List: Impaired vision/perception         OT Goals(Current goals can be found in the care plan section) Acute Rehab OT Goals Patient Stated Goal: to go home today  OT Frequency:                AM-PAC OT "6 Clicks" Daily Activity     Outcome Measure Help from another person eating meals?: None Help from another person taking care of personal grooming?: A Little (S) Help from another person toileting, which includes using toliet, bedpan, or urinal?: A Little (S) Help from another person bathing (including washing, rinsing, drying)?: A Little (S) Help from another person to put on and taking off regular upper body clothing?: A Little (S) Help from another person to put on and taking off regular lower body clothing?: A Little (S) 6 Click Score: 19   End of Session Equipment Utilized During Treatment: Gait belt  Activity Tolerance: Patient tolerated treatment well Patient left: in chair;with call bell/phone within reach  OT Visit Diagnosis:  (low vision, one eye)                Time: 1914-7829 OT Time Calculation (min): 31 min Charges:  OT General Charges $OT Visit: 1 Visit OT Evaluation $OT Eval Moderate Complexity: 1 Mod OT  Treatments $Self Care/Home Management : 8-22 mins  Philip Mcclain, OTR/L Acute [employer] Pager [phone] Office [phone]     Almon Register 08/21/2020, 2:32 PM

## 2020-08-21 NOTE — Discharge Summary (Addendum)
[facility] Discharge Summary   Patient: Philip Mcclain TFT:[id number]  PCP: Glenda Chroman, MD  Date of admission: 08/19/2020   Date of discharge: 08/21/2020     Discharge Diagnoses:  Principal diagnosis Acute central retinal artery occlusion  Active Problems:   CVA (cerebral vascular accident) ([place]) CKD 3B  Admitted From: Home Disposition:  Home   Recommendations for Outpatient Follow-up:  1. PCP: Follow-up with PCP in 1 week, check BMP. 2. Follow-up with nephrology to discuss renal function progression 3. Follow-up with urology for mild right-sided hydronephrosis, referral sent to [facility] urology [facility]. 4. Follow-up with neurology for stroke follow-up. 5. Follow up LABS/TEST: BMP in 1 week, 30-day event monitor with cardiology   Follow-up [place], Dhruv B, MD. Schedule an appointment as soon as possible for a visit in 1 week(s).   Specialty: Internal Medicine Contact information: [place] [address] [place]. Schedule an appointment as soon as possible for a visit in 1 month(s).   Contact information: [place]       Liana Gerold, MD. Schedule an appointment as soon as possible for a visit in 1 month(s).   Specialty: Nephrology Contact information: [address] [address] [phone]        [facility]. Schedule an appointment as soon as possible for a visit in 2 month(s).   Specialty: Neurology Contact information: [address] [place] [phone]             Discharge Instructions    Ambulatory referral to Neurology   Complete by: As directed    An appointment is requested in approximately: 8 weeks   Ambulatory referral to Urology   Complete by: As directed    Diet - low sodium heart healthy   Complete by: As directed    Increase  activity slowly   Complete by: As directed       Diet recommendation: Cardiac diet  Activity: The patient is advised to gradually reintroduce usual activities, as tolerated  Discharge Condition: stable  Code Status: DNR   History of present illness: As per the H and P dictated on admission, " Philip Mcclain is a 86 y.o. male with medical history significant of HTN, " irregular heartbeat", CKD stage II, BPH, presented with vision loss on the right eye.  Symptoms started last night while watching TV, all of a sudden, patient started to feel the right sided vision became very bright with shiny stars, initially he thought it was the lamp on the side table, but symptoms persisted after turndown the lamp.  He then went to sleep, but woke up this morning he found out he could not see anything on the right eye anymore.  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  His ophthalmologist sent him to the ED.  He denied any weakness numbness of any of the limbs, no headache, no hearing changes.  Patient used to be on aspirin remotely, however he stopped taking it recent years.  About 3 to 4 months ago, he restarted to take aspirin after listening to his sister's advice, however he noticed he got very easily bruised on his arms, as as result he stopped taking ASA several weeks ago."  Hospital Course:  Summary of his active problems in  the hospital is as following. 1.   Acute Right CRAO Etiology not clear. Unstable carotid plaque versus occult atrial flutter. CT head unremarkable. MRI head no acute stroke. MRA head moderate stenosis, MRA neck nondiagnostic. Carotid Doppler unremarkable. If the patient's renal functions improve, neurology would recommend CTA to rule out unstable plaque. 2D echo 6 to 65% EF. LDL 115. No medication prior to admission. Patient was taking aspirin for primary prophylaxis and stopped taking it 1 month ago. Currently on 81 mg  aspirin and 75 mg Plavix. Continue DAPT for 3 weeks and then aspirin alone. PT OT recommends no therapy on discharge Patient passed swallowing evaluation.  2.  Junctional rhythm Concern for occult atrial flutter. Patient will need 30-day event monitoring  3.  Acute kidney injury on CKD 3B Based on renal function around 1.6. On admission creatinine 2.18. Ultrasound renal shows mild hydronephrosis without any stone. Discussed with urology, outpatient follow-up on discharge. Follow-up with nephrology as well.  4.  Essential hypertension Currently blood pressure stable. Holding home medication ARB. Permissive hypertension.  5.  HLD LDL 115. On Lipitor 40. Continue.  6.  Claustrophobia  7.  Occasional junctional bradycardia Asymptomatic. Seen on telemetry. Will monitor.  Patient was seen by physical therapy, who recommended No therapy needed on discharge. On the day of the discharge the patient's vitals were stable, and no other acute medical condition were reported by patient. The patient was felt safe to be discharge at Home with no therapy needed on discharge.  Consultants: Neurology  Procedures: Echocardiogram   DISCHARGE MEDICATION: Allergies as of 08/21/2020   No Known Allergies     Medication List    STOP taking these medications   valsartan 160 MG tablet Commonly known as: DIOVAN     TAKE these medications   aspirin 81 MG EC tablet Take 1 tablet (81 mg total) by mouth daily. Swallow whole. Start taking on: August 22, 2020   atorvastatin 40 MG tablet Commonly known as: LIPITOR Take 1 tablet (40 mg total) by mouth daily. Start taking on: August 22, 2020   clopidogrel 75 MG tablet Commonly known as: PLAVIX Take 1 tablet (75 mg total) by mouth daily for 19 days. Start taking on: August 22, 2020   furosemide 40 MG tablet Commonly known as: LASIX Take 40 mg by mouth daily.   HYDROcodone-acetaminophen 5-325 MG tablet Commonly known as:  NORCO/VICODIN Take 1 tablet by mouth 2 (two) times daily as needed for pain.   metoprolol succinate 50 MG 24 hr tablet Commonly known as: TOPROL-XL Take 50 mg by mouth 2 (two) times daily.   MULTIVITAMIN ADULTS 50+ PO Take 1 tablet by mouth every evening.   pantoprazole 40 MG tablet Commonly known as: PROTONIX Take 40 mg by mouth every evening.   potassium chloride 10 MEQ tablet Commonly known as: KLOR-CON Take 10 mEq by mouth every evening.   tamsulosin 0.4 MG Caps capsule Commonly known as: FLOMAX Take 0.4 mg by mouth 2 (two) times daily.       Discharge Exam: There were no vitals filed for this visit. Vitals:   08/21/20 0612 08/21/20 1113  BP: (!) 161/76   Pulse: 72   Resp: 20   Temp: 98.4 F (36.9 C)   SpO2: 97% 93%   General: Appear in no distress, no Rash; Oral Mucosa Clear, moist. no Abnormal Neck Mass Or lumps, Conjunctiva normal  Cardiovascular: S1 and S2 Present, no Murmur Respiratory: good respiratory effort, Bilateral Air entry present and CTA,  no Crackles, no wheezes Abdomen: Bowel Sound present, Soft and no tenderness Extremities: no Pedal edema Neurology: alert and oriented to time, place, and person affect appropriate. no new focal deficit  The results of significant diagnostics from this hospitalization (including imaging, microbiology, ancillary and laboratory) are listed below for reference.    Significant Diagnostic Studies: CT HEAD WO CONTRAST  Result Date: 08/19/2020 CLINICAL DATA:  Vision loss, monocular. EXAM: CT HEAD WITHOUT CONTRAST TECHNIQUE: Contiguous axial images were obtained from the base of the skull through the vertex without intravenous contrast. COMPARISON:  None. FINDINGS: Brain: No evidence of acute large vascular territory infarction, hemorrhage, hydrocephalus, extra-axial collection or mass lesion/mass effect. Moderate patchy white matter hypoattenuation, most likely related to chronic microvascular ischemic disease. Tiny  remote appearing infarct in the right caudate. Vascular: Calcific atherosclerosis. Skull: No acute fracture. Sinuses/Orbits: Near complete opacification of the partially imaged right maxillary sinus with surrounding bony wall thickening. Unremarkable orbits. Other: No mastoid effusions. IMPRESSION: 1. No evidence of acute intracranial abnormality. 2. 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 3. Moderate chronic microvascular ischemic disease. 4. Chronic appearing right maxillary sinus disease. Electronically Signed   By: Margaretha Sheffield MD   On: 08/19/2020 14:51   MR ANGIO HEAD WO CONTRAST  Result Date: 08/19/2020 CLINICAL DATA:  Right eye vision loss EXAM: MRI HEAD WITHOUT CONTRAST MRA HEAD WITHOUT CONTRAST TECHNIQUE: Multiplanar, multiecho pulse sequences of the brain and surrounding structures were obtained without intravenous contrast. Angiographic images of the head were obtained using MRA technique without contrast. COMPARISON:  None. FINDINGS: MRI HEAD FINDINGS Brain: No acute infarct, mass effect or extra-axial collection. Single focus of chronic microhemorrhage in the right cerebellar hemisphere. There is multifocal hyperintense T2-weighted signal within the white matter. Parenchymal volume and CSF spaces are normal. The midline structures are normal. Vascular: Major flow voids are preserved. Skull and upper cervical spine: Normal calvarium and skull base. Visualized upper cervical spine and soft tissues are normal. Sinuses/Orbits:Right maxillary sinus mucosal thickening. Normal orbits. MRA HEAD FINDINGS POSTERIOR CIRCULATION: --Vertebral arteries: Normal --Inferior cerebellar arteries: Normal. --Basilar artery: Normal. --Superior cerebellar arteries: Normal. --Posterior cerebral arteries: Normal. ANTERIOR CIRCULATION: --Intracranial internal carotid arteries: Normal. --Anterior cerebral arteries (ACA): Normal. --Middle cerebral arteries (MCA): Multifocal moderate stenosis of both middle  cerebral arteries predominantly affecting the distal M1 segments and proximal M2 segments. ANATOMIC VARIANTS: Fetal origin of the right PCA. Patent left P-comm. IMPRESSION: 1. No acute intracranial abnormality. 2. Multifocal moderate stenosis of both middle cerebral arteries due to atherosclerotic disease. 3. Chronic ischemic microangiopathy. Electronically Signed   By: Ulyses Jarred M.D.   On: 08/19/2020 19:26   MR ANGIO NECK WO CONTRAST  Result Date: 08/20/2020 CLINICAL DATA:  Acute painless vision loss of the right eye. EXAM: MRA NECK WITHOUT  CONTRAST TECHNIQUE: Multiplanar and multiecho pulse sequences of the neck were obtained without intravenous contrast. Angiographic images of the neck were obtained using MRA technique without and with intravenous contrast. COMPARISON:  Head studies done yesterday. FINDINGS: Noncontrast exam suffers from motion degradation and poor detail due to the noncontrast technique. Both common carotid arteries are patent to the bifurcation regions. Particular signal loss due to technical deficiency in the region of the carotid bifurcations. I cannot accurately assess for carotid bifurcation stenosis. Beyond the bifurcation regions, the upper cervical internal carotid arteries appear patent and normal. Antegrade flow is present in both vertebral arteries. Proximal aspects cannot be evaluated because of motion and signal loss. Consider CT angiography if a better  evaluation of the neck vessels is necessary. IMPRESSION: Very suboptimal study due to motion degradation and some sort of other additional artifact with signal loss in the carotid bifurcation regions. Antegrade flow can be seen in both vertebral arteries and both internal carotid arteries, but I cannot make any statement regarding stenotic disease. Consider CT angiography if better information is desired. Electronically Signed   By: Nelson Chimes M.D.   On: 08/20/2020 18:35   MR BRAIN WO CONTRAST  Result Date:  08/19/2020 CLINICAL DATA:  Right eye vision loss EXAM: MRI HEAD WITHOUT CONTRAST MRA HEAD WITHOUT CONTRAST TECHNIQUE: Multiplanar, multiecho pulse sequences of the brain and surrounding structures were obtained without intravenous contrast. Angiographic images of the head were obtained using MRA technique without contrast. COMPARISON:  None. FINDINGS: MRI HEAD FINDINGS Brain: No acute infarct, mass effect or extra-axial collection. Single focus of chronic microhemorrhage in the right cerebellar hemisphere. There is multifocal hyperintense T2-weighted signal within the white matter. Parenchymal volume and CSF spaces are normal. The midline structures are normal. Vascular: Major flow voids are preserved. Skull and upper cervical spine: Normal calvarium and skull base. Visualized upper cervical spine and soft tissues are normal. Sinuses/Orbits:Right maxillary sinus mucosal thickening. Normal orbits. MRA HEAD FINDINGS POSTERIOR CIRCULATION: --Vertebral arteries: Normal --Inferior cerebellar arteries: Normal. --Basilar artery: Normal. --Superior cerebellar arteries: Normal. --Posterior cerebral arteries: Normal. ANTERIOR CIRCULATION: --Intracranial internal carotid arteries: Normal. --Anterior cerebral arteries (ACA): Normal. --Middle cerebral arteries (MCA): Multifocal moderate stenosis of both middle cerebral arteries predominantly affecting the distal M1 segments and proximal M2 segments. ANATOMIC VARIANTS: Fetal origin of the right PCA. Patent left P-comm. IMPRESSION: 1. No acute intracranial abnormality. 2. Multifocal moderate stenosis of both middle cerebral arteries due to atherosclerotic disease. 3. Chronic ischemic microangiopathy. Electronically Signed   By: Ulyses Jarred M.D.   On: 08/19/2020 19:26   US RENAL  Result Date: 08/19/2020 CLINICAL DATA:  Acute kidney injury EXAM: RENAL / URINARY TRACT ULTRASOUND COMPLETE COMPARISON:  None. FINDINGS: Right Kidney: Renal measurements: 10.2 x 5.9 x 4.6 cm =  volume: 143 mL. Mild right hydronephrosis. Normal echotexture. No focal mass. Left Kidney: Renal measurements: 9.6 x 5.854.4 cm = volume: 121 mL. Echogenicity within normal limits. No mass or hydronephrosis visualized. Bladder: Appears normal for degree of bladder distention. Other: None. IMPRESSION: Mild right hydronephrosis. Electronically Signed   By: Rolm Baptise M.D.   On: 08/19/2020 19:21   ECHOCARDIOGRAM COMPLETE  Result Date: 08/20/2020    ECHOCARDIOGRAM REPORT   Patient Name:   Philip Mcclain Date of Exam: 08/20/2020 Medical Rec #:  [id number]   Height:       70.0 in Accession #:    [id number]  Weight:       190.6 lb Date of Birth:  1934/07/06   BSA:          2.045 m Patient Age:    24 years    BP:           197/95 mmHg Patient Gender: M           HR:           78 bpm. Exam Location:  Inpatient Procedure: 2D Echo, Color Doppler, Cardiac Doppler and Intracardiac            Opacification Agent Indications:    TIA 435.9 / G45.9  History:        Patient has prior history of Echocardiogram examinations, most  recent 05/14/2018. Stroke, Arrythmias:Atrial Fibrillation,                 Signs/Symptoms:Murmur; Risk Factors:Hypertension. Chronic kidney                 disease.  Sonographer:    Darlina Sicilian RDCS Referring Phys: [id number] Point  1. Left ventricular ejection fraction, by estimation, is 60 to 65%. The left ventricle has normal function. The left ventricle has no regional wall motion abnormalities. There is mild concentric left ventricular hypertrophy. Left ventricular diastolic function could not be evaluated. Elevated left ventricular end-diastolic pressure.  2. Right ventricular systolic function is moderately reduced. The right ventricular size is not well visualized. There is severely elevated pulmonary artery systolic pressure. The estimated right ventricular systolic pressure is 72.5 mmHg.  3. Left atrial size was moderately dilated.  4. Right atrial size was  severely dilated.  5. The mitral valve is abnormal. Mild mitral valve regurgitation. Mild mitral stenosis. Severe mitral annular calcification.  6. Tricuspid valve regurgitation is moderate to severe.  7. The aortic valve is calcified. There is moderate calcification of the aortic valve. There is mild thickening of the aortic valve. Aortic valve regurgitation is trivial. Mild aortic valve stenosis.  8. The inferior vena cava is dilated in size with <50% respiratory variability, suggesting right atrial pressure of 15 mmHg. Comparison(s): Changes from prior study are noted. Conclusion(s)/Recommendation(s): No intracardiac source of embolism detected on this transthoracic study. A transesophageal echocardiogram is recommended to exclude cardiac source of embolism if clinically indicated. Right sided pressures now severely elevated compared to prior. FINDINGS  Left Ventricle: Left ventricular ejection fraction, by estimation, is 60 to 65%. The left ventricle has normal function. The left ventricle has no regional wall motion abnormalities. Definity contrast agent was given IV to delineate the left ventricular  endocardial borders. The left ventricular internal cavity size was normal in size. There is mild concentric left ventricular hypertrophy. Left ventricular diastolic function could not be evaluated due to atrial fibrillation. Left ventricular diastolic function could not be evaluated. Elevated left ventricular end-diastolic pressure. Right Ventricle: The right ventricular size is not well visualized. Right vetricular wall thickness was not well visualized. Right ventricular systolic function is moderately reduced. There is severely elevated pulmonary artery systolic pressure. The tricuspid regurgitant velocity is 4.41 m/s, and with an assumed right atrial pressure of 15 mmHg, the estimated right ventricular systolic pressure is 36.6 mmHg. Left Atrium: Left atrial size was moderately dilated. Right Atrium: Right  atrial size was severely dilated. Pericardium: There is no evidence of pericardial effusion. Mitral Valve: The mitral valve is abnormal. There is moderate thickening of the mitral valve leaflet(s). There is moderate calcification of the mitral valve leaflet(s). Severe mitral annular calcification. Mild mitral valve regurgitation. Mild mitral valve stenosis. MV peak gradient, 19.7 mmHg. The mean mitral valve gradient is 6.8 mmHg. Tricuspid Valve: The tricuspid valve is normal in structure. Tricuspid valve regurgitation is moderate to severe. Aortic Valve: The aortic valve is calcified. There is moderate calcification of the aortic valve. There is mild thickening of the aortic valve. There is moderate aortic valve annular calcification. Aortic valve regurgitation is trivial. Mild aortic stenosis is present. Aortic valve mean gradient measures 6.0 mmHg. Aortic valve peak gradient measures 10.4 mmHg. Aortic valve area, by VTI measures 1.96 cm. Pulmonic Valve: The pulmonic valve was not well visualized. Pulmonic valve regurgitation is trivial. Aorta: The aortic root, ascending aorta and aortic arch are all structurally  normal, with no evidence of dilitation or obstruction. Venous: The inferior vena cava is dilated in size with less than 50% respiratory variability, suggesting right atrial pressure of 15 mmHg. IAS/Shunts: The atrial septum is grossly normal.  LEFT VENTRICLE PLAX 2D LVIDd:         5.30 cm      Diastology LVIDs:         3.90 cm      LV e' medial:    5.63 cm/s LV PW:         1.30 cm      LV E/e' medial:  33.9 LV IVS:        1.10 cm      LV e' lateral:   9.01 cm/s LVOT diam:     1.80 cm      LV E/e' lateral: 21.2 LV SV:         56 LV SV Index:   28 LVOT Area:     2.54 cm  LV Volumes (MOD) LV vol d, MOD A2C: 124.0 ml LV vol d, MOD A4C: 116.0 ml LV vol s, MOD A2C: 29.9 ml LV vol s, MOD A4C: 52.8 ml LV SV MOD A2C:     94.1 ml LV SV MOD A4C:     116.0 ml LV SV MOD BP:      80.7 ml RIGHT VENTRICLE TAPSE  (M-mode): 1.4 cm LEFT ATRIUM             Index       RIGHT ATRIUM           Index LA diam:        4.20 cm 2.05 cm/m  RA Area:     28.10 cm LA Vol (A2C):   75.6 ml 36.97 ml/m RA Volume:   90.30 ml  44.16 ml/m LA Vol (A4C):   90.4 ml 44.20 ml/m LA Biplane Vol: 89.9 ml 43.96 ml/m  AORTIC VALVE AV Area (Vmax):    1.88 cm AV Area (Vmean):   1.68 cm AV Area (VTI):     1.96 cm AV Vmax:           161.00 cm/s AV Vmean:          108.000 cm/s AV VTI:            0.288 m AV Peak Grad:      10.4 mmHg AV Mean Grad:      6.0 mmHg LVOT Vmax:         119.00 cm/s LVOT Vmean:        71.100 cm/s LVOT VTI:          0.222 m LVOT/AV VTI ratio: 0.77  AORTA Ao Root diam: 3.80 cm MITRAL VALVE                TRICUSPID VALVE MV Area (PHT): 3.48 cm     TR Peak grad:   77.8 mmHg MV Peak grad:  19.7 mmHg    TR Vmax:        441.00 cm/s MV Mean grad:  6.8 mmHg MV Vmax:       2.22 m/s     SHUNTS MV Vmean:      114.4 cm/s   Systemic VTI:  0.22 m MV Decel Time: 218 msec     Systemic Diam: 1.80 cm MV E velocity: 191.00 cm/s Buford Dresser MD Electronically signed by Buford Dresser MD Signature Date/Time: 08/20/2020/4:43:10 PM    Final    VAS US CAROTID  Result Date: 08/20/2020 Carotid Arterial Duplex Study Indications:       CVA. Risk Factors:      Hypertension. Comparison Study:  No previous exam Performing Technologist: Vonzell Schlatter RVT  Examination Guidelines: A complete evaluation includes B-mode imaging, spectral Doppler, color Doppler, and power Doppler as needed of all accessible portions of each vessel. Bilateral testing is considered an integral part of a complete examination. Limited examinations for reoccurring indications may be performed as noted.  Right Carotid Findings: +----------+--------+--------+--------+------------------+--------+           PSV cm/sEDV cm/sStenosisPlaque DescriptionComments +----------+--------+--------+--------+------------------+--------+ CCA Prox  63      11                                          +----------+--------+--------+--------+------------------+--------+ CCA Distal58      8                                          +----------+--------+--------+--------+------------------+--------+ ICA Prox  72      13      1-39%   heterogenous               +----------+--------+--------+--------+------------------+--------+ ICA Distal98      22                                         +----------+--------+--------+--------+------------------+--------+ ECA       128                                                +----------+--------+--------+--------+------------------+--------+ +----------+--------+-------+--------+-------------------+           PSV cm/sEDV cmsDescribeArm Pressure (mmHG) +----------+--------+-------+--------+-------------------+ [id number]                                         +----------+--------+-------+--------+-------------------+ +---------+--------+--+--------+--+ VertebralPSV cm/s61EDV cm/s13 +---------+--------+--+--------+--+  Left Carotid Findings: +----------+--------+--------+--------+------------------+--------+           PSV cm/sEDV cm/sStenosisPlaque DescriptionComments +----------+--------+--------+--------+------------------+--------+ CCA Prox  88      12                                         +----------+--------+--------+--------+------------------+--------+ CCA Distal46      9                                          +----------+--------+--------+--------+------------------+--------+ ICA Prox  115     33      1-39%   heterogenous               +----------+--------+--------+--------+------------------+--------+ ICA Distal83      23                                         +----------+--------+--------+--------+------------------+--------+  ECA       128                                                +----------+--------+--------+--------+------------------+--------+  +----------+--------+--------+--------+-------------------+           PSV cm/sEDV cm/sDescribeArm Pressure (mmHG) +----------+--------+--------+--------+-------------------+ [id number]                                         +----------+--------+--------+--------+-------------------+ +---------+--------+--+--------+--+ VertebralPSV cm/s67EDV cm/s14 +---------+--------+--+--------+--+   Summary: Right Carotid: Velocities in the right ICA are consistent with a 1-39% stenosis. Left Carotid: Velocities in the left ICA are consistent with a 1-39% stenosis. Vertebrals:  Bilateral vertebral arteries demonstrate antegrade flow. Subclavians: Normal flow hemodynamics were seen in bilateral subclavian              arteries. *See table(s) above for measurements and observations.  Electronically signed by Monica Martinez MD on 08/20/2020 at 4:58:15 PM.    Final     Microbiology: Recent Results (from the past 240 hour(s))  Resp Panel by RT-PCR (Flu A&B, Covid) Nasopharyngeal Swab     Status: None   Collection Time: 08/19/20  8:31 PM   Specimen: Nasopharyngeal Swab; Nasopharyngeal(NP) swabs in vial transport medium  Result Value Ref Range Status   SARS Coronavirus 2 by RT PCR NEGATIVE NEGATIVE Final    Comment: (NOTE) SARS-CoV-2 target nucleic acids are NOT DETECTED.  The SARS-CoV-2 RNA is generally detectable in upper respiratory specimens during the acute phase of infection. The lowest concentration of SARS-CoV-2 viral copies this assay can detect is 138 copies/mL. A negative result does not preclude SARS-Cov-2 infection and should not be used as the sole basis for treatment or other patient management decisions. A negative result may occur with  improper specimen collection/handling, submission of specimen other than nasopharyngeal swab, presence of viral mutation(s) within the areas targeted by this assay, and inadequate number of viral copies(<138 copies/mL). A negative result must  be combined with clinical observations, patient history, and epidemiological information. The expected result is Negative.  Fact Sheet for Patients:  EntrepreneurPulse.com.au  Fact Sheet for Healthcare Providers:  IncredibleEmployment.be  This test is no t yet approved or cleared by the [place] FDA and  has been authorized for detection and/or diagnosis of SARS-CoV-2 by FDA under an Emergency Use Authorization (EUA). This EUA will remain  in effect (meaning this test can be used) for the duration of the COVID-19 declaration under Section 564(b)(1) of the Act, 21 U.S.C.section 360bbb-3(b)(1), unless the authorization is terminated  or revoked sooner.       Influenza A by PCR NEGATIVE NEGATIVE Final   Influenza B by PCR NEGATIVE NEGATIVE Final    Comment: (NOTE) The Xpert Xpress SARS-CoV-2/FLU/RSV plus assay is intended as an aid in the diagnosis of influenza from Nasopharyngeal swab specimens and should not be used as a sole basis for treatment. Nasal washings and aspirates are unacceptable for Xpert Xpress SARS-CoV-2/FLU/RSV testing.  Fact Sheet for Patients: EntrepreneurPulse.com.au  Fact Sheet for Healthcare Providers: IncredibleEmployment.be  This test is not yet approved or cleared by the [place] FDA and has been authorized for detection and/or diagnosis of SARS-CoV-2 by FDA under an Emergency Use Authorization (EUA). This EUA will remain in effect (meaning this test  can be used) for the duration of the COVID-19 declaration under Section 564(b)(1) of the Act, 21 U.S.C. section 360bbb-3(b)(1), unless the authorization is terminated or revoked.  Performed at [facility], [place] [address], [place], [place] [address]      Labs: CBC: Recent Labs  Lab 08/19/20 1401 08/21/20 0354  WBC 5.8 5.0  NEUTROABS 3.9  --   HGB 12.9* 10.7*  HCT 40.1 29.9*  MCV 95.7 90.3  PLT 138* 106*    Basic Metabolic Panel: Recent Labs  Lab 08/19/20 1401 08/20/20 0021 08/21/20 0354  NA 139 139 138  K 4.6 3.9 4.1  CL 104 106 108  CO2 19* 21* 21*  GLUCOSE 104* 99 100*  BUN 42* 39* 34*  CREATININE 2.18* 1.99* 1.86*  CALCIUM 10.3 9.3 9.3  MG  --   --  1.8  PHOS  --   --  2.4*   Liver Function Tests: Recent Labs  Lab 08/19/20 1401 08/21/20 0354  AST 24  --   ALT 17  --   ALKPHOS 74  --   BILITOT 1.0  --   PROT 7.1  --   ALBUMIN 4.3 3.2*   CBG: Recent Labs  Lab 08/19/20 1545  GLUCAP 96    Time spent: 35 minutes  Signed:  Berle Mull  [facility] 08/21/2020 5:03 PM

## 2020-08-26 ENCOUNTER — Telehealth: Payer: Self-pay | Admitting: *Deleted

## 2020-08-26 DIAGNOSIS — I10 Essential (primary) hypertension: Secondary | ICD-10-CM | POA: Diagnosis not present

## 2020-08-26 DIAGNOSIS — Z299 Encounter for prophylactic measures, unspecified: Secondary | ICD-10-CM | POA: Diagnosis not present

## 2020-08-26 DIAGNOSIS — I270 Primary pulmonary hypertension: Secondary | ICD-10-CM | POA: Diagnosis not present

## 2020-08-26 DIAGNOSIS — N1832 Chronic kidney disease, stage 3b: Secondary | ICD-10-CM | POA: Diagnosis not present

## 2020-08-26 DIAGNOSIS — Z09 Encounter for follow-up examination after completed treatment for conditions other than malignant neoplasm: Secondary | ICD-10-CM | POA: Diagnosis not present

## 2020-08-26 DIAGNOSIS — H3411 Central retinal artery occlusion, right eye: Secondary | ICD-10-CM | POA: Diagnosis not present

## 2020-08-26 NOTE — Telephone Encounter (Signed)
Pt enrolled in [employer] and pt notified

## 2020-09-01 ENCOUNTER — Ambulatory Visit (INDEPENDENT_AMBULATORY_CARE_PROVIDER_SITE_OTHER): Payer: Medicare Other

## 2020-09-01 DIAGNOSIS — I480 Paroxysmal atrial fibrillation: Secondary | ICD-10-CM | POA: Diagnosis not present

## 2020-09-01 DIAGNOSIS — G464 Cerebellar stroke syndrome: Secondary | ICD-10-CM | POA: Diagnosis not present

## 2020-09-01 DIAGNOSIS — I63421 Cerebral infarction due to embolism of right anterior cerebral artery: Secondary | ICD-10-CM | POA: Diagnosis not present

## 2020-09-02 ENCOUNTER — Other Ambulatory Visit: Payer: Self-pay

## 2020-09-02 DIAGNOSIS — M159 Polyosteoarthritis, unspecified: Secondary | ICD-10-CM | POA: Diagnosis not present

## 2020-09-02 DIAGNOSIS — I10 Essential (primary) hypertension: Secondary | ICD-10-CM | POA: Diagnosis not present

## 2020-09-10 ENCOUNTER — Ambulatory Visit (INDEPENDENT_AMBULATORY_CARE_PROVIDER_SITE_OTHER): Payer: Medicare Other | Admitting: Urology

## 2020-09-10 ENCOUNTER — Encounter: Payer: Self-pay | Admitting: Urology

## 2020-09-10 ENCOUNTER — Other Ambulatory Visit: Payer: Self-pay

## 2020-09-10 VITALS — BP 179/76 | HR 73 | Temp 97.7°F | Ht 71.0 in | Wt 176.0 lb

## 2020-09-10 DIAGNOSIS — R339 Retention of urine, unspecified: Secondary | ICD-10-CM | POA: Diagnosis not present

## 2020-09-10 DIAGNOSIS — N1339 Other hydronephrosis: Secondary | ICD-10-CM | POA: Diagnosis not present

## 2020-09-10 DIAGNOSIS — N401 Enlarged prostate with lower urinary tract symptoms: Secondary | ICD-10-CM

## 2020-09-10 DIAGNOSIS — N138 Other obstructive and reflux uropathy: Secondary | ICD-10-CM | POA: Diagnosis not present

## 2020-09-10 DIAGNOSIS — R3912 Poor urinary stream: Secondary | ICD-10-CM | POA: Diagnosis not present

## 2020-09-10 DIAGNOSIS — N179 Acute kidney failure, unspecified: Secondary | ICD-10-CM | POA: Diagnosis not present

## 2020-09-10 LAB — MICROSCOPIC EXAMINATION: RBC, Urine: NONE SEEN /hpf (ref 0–2)

## 2020-09-10 LAB — URINALYSIS, ROUTINE W REFLEX MICROSCOPIC
Bilirubin, UA: NEGATIVE
Glucose, UA: NEGATIVE
Ketones, UA: NEGATIVE
Leukocytes,UA: NEGATIVE
Nitrite, UA: NEGATIVE
RBC, UA: NEGATIVE
Specific Gravity, UA: 1.015 (ref 1.005–1.030)
Urobilinogen, Ur: 0.2 mg/dL (ref 0.2–1.0)
pH, UA: 5.5 (ref 5.0–7.5)

## 2020-09-10 LAB — BLADDER SCAN AMB NON-IMAGING: Scan Result: 814

## 2020-09-10 NOTE — Progress Notes (Signed)
Bladder Scan Patient can void: 814 ml Performed By: Durenda Guthrie, lpn    Urological Symptom Review  Patient is experiencing the following symptoms: Frequent urination Get up at night to urinate   Review of Systems  Gastrointestinal (upper)  : Negative for upper GI symptoms  Gastrointestinal (lower) : Negative for lower GI symptoms  Constitutional : Negative for symptoms  Skin: Negative for skin symptoms  Eyes: Negative for eye symptoms  Ear/Nose/Throat : Negative for Ear/Nose/Throat symptoms  Hematologic/Lymphatic: Negative for Hematologic/Lymphatic symptoms  Cardiovascular : Leg swelling  Respiratory : Negative for respiratory symptoms  Endocrine: Negative for endocrine symptoms  Musculoskeletal: Back pain Joint pain  Neurological: Negative for neurological symptoms  Psychologic: Negative for psychiatric symptoms
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
## 2020-09-10 NOTE — Patient Instructions (Signed)

## 2020-09-17 DIAGNOSIS — N130 Hydronephrosis with ureteropelvic junction obstruction: Secondary | ICD-10-CM | POA: Diagnosis not present

## 2020-09-17 DIAGNOSIS — R3914 Feeling of incomplete bladder emptying: Secondary | ICD-10-CM | POA: Diagnosis not present

## 2020-09-17 DIAGNOSIS — N401 Enlarged prostate with lower urinary tract symptoms: Secondary | ICD-10-CM | POA: Diagnosis not present

## 2020-09-22 ENCOUNTER — Telehealth: Payer: Self-pay

## 2020-09-22 NOTE — Telephone Encounter (Signed)
Received call from [employer] that patient had triggered his monitor button stating he had passed out. I spoke with patient and he said he was having trouble with contact and he did not push syncope. States he is fine.

## 2020-09-28 ENCOUNTER — Inpatient Hospital Stay: Payer: Medicare Other | Admitting: Adult Health

## 2020-09-29 ENCOUNTER — Ambulatory Visit (HOSPITAL_COMMUNITY): Payer: Medicare Other

## 2020-10-04 ENCOUNTER — Other Ambulatory Visit: Payer: Self-pay

## 2020-10-04 DIAGNOSIS — I10 Essential (primary) hypertension: Secondary | ICD-10-CM | POA: Diagnosis not present

## 2020-10-05 ENCOUNTER — Other Ambulatory Visit: Payer: Self-pay | Admitting: Internal Medicine

## 2020-10-05 DIAGNOSIS — I480 Paroxysmal atrial fibrillation: Secondary | ICD-10-CM

## 2020-10-05 NOTE — Progress Notes (Signed)
Pt has atrial fibrillation on 30 day monitor. Discussed with pt on phone and recommended them to contact with PCP for medication management. Pt is also suppose to follow up with neurology.   Referral to Cardiology placed as well.

## 2020-10-11 ENCOUNTER — Ambulatory Visit: Payer: 59 | Admitting: Urology

## 2020-10-11 DIAGNOSIS — I4891 Unspecified atrial fibrillation: Secondary | ICD-10-CM | POA: Diagnosis not present

## 2020-10-11 DIAGNOSIS — I272 Pulmonary hypertension, unspecified: Secondary | ICD-10-CM | POA: Diagnosis not present

## 2020-10-11 DIAGNOSIS — N183 Chronic kidney disease, stage 3 unspecified: Secondary | ICD-10-CM | POA: Diagnosis not present

## 2020-10-11 DIAGNOSIS — M545 Low back pain, unspecified: Secondary | ICD-10-CM | POA: Diagnosis not present

## 2020-10-11 DIAGNOSIS — Z299 Encounter for prophylactic measures, unspecified: Secondary | ICD-10-CM | POA: Diagnosis not present

## 2020-10-18 DIAGNOSIS — H43813 Vitreous degeneration, bilateral: Secondary | ICD-10-CM | POA: Diagnosis not present

## 2020-10-18 DIAGNOSIS — H3582 Retinal ischemia: Secondary | ICD-10-CM | POA: Diagnosis not present

## 2020-10-18 DIAGNOSIS — H35033 Hypertensive retinopathy, bilateral: Secondary | ICD-10-CM | POA: Diagnosis not present

## 2020-10-18 DIAGNOSIS — H3411 Central retinal artery occlusion, right eye: Secondary | ICD-10-CM | POA: Diagnosis not present

## 2020-10-18 DIAGNOSIS — H33321 Round hole, right eye: Secondary | ICD-10-CM | POA: Diagnosis not present

## 2020-10-22 ENCOUNTER — Other Ambulatory Visit: Payer: Self-pay

## 2020-10-22 ENCOUNTER — Ambulatory Visit (HOSPITAL_COMMUNITY)
Admission: RE | Admit: 2020-10-22 | Discharge: 2020-10-22 | Disposition: A | Discharge status: Home / Self Care | Payer: Medicare Other | Source: Ambulatory Visit | Attending: Urology | Admitting: Urology

## 2020-10-22 DIAGNOSIS — N1339 Other hydronephrosis: Secondary | ICD-10-CM

## 2020-10-22 DIAGNOSIS — N289 Disorder of kidney and ureter, unspecified: Secondary | ICD-10-CM | POA: Diagnosis not present

## 2020-10-22 DIAGNOSIS — N210 Calculus in bladder: Secondary | ICD-10-CM | POA: Diagnosis not present

## 2020-10-22 DIAGNOSIS — K8020 Calculus of gallbladder without cholecystitis without obstruction: Secondary | ICD-10-CM | POA: Diagnosis not present

## 2020-10-22 DIAGNOSIS — N320 Bladder-neck obstruction: Secondary | ICD-10-CM | POA: Diagnosis not present

## 2020-10-25 ENCOUNTER — Ambulatory Visit (INDEPENDENT_AMBULATORY_CARE_PROVIDER_SITE_OTHER): Payer: Medicare Other | Admitting: Urology

## 2020-10-25 ENCOUNTER — Other Ambulatory Visit: Payer: Self-pay

## 2020-10-25 ENCOUNTER — Encounter: Payer: Self-pay | Admitting: Urology

## 2020-10-25 VITALS — BP 148/77 | HR 59 | Temp 97.7°F

## 2020-10-25 DIAGNOSIS — N138 Other obstructive and reflux uropathy: Secondary | ICD-10-CM | POA: Diagnosis not present

## 2020-10-25 DIAGNOSIS — N401 Enlarged prostate with lower urinary tract symptoms: Secondary | ICD-10-CM | POA: Diagnosis not present

## 2020-10-25 DIAGNOSIS — R339 Retention of urine, unspecified: Secondary | ICD-10-CM | POA: Diagnosis not present

## 2020-10-25 DIAGNOSIS — N1339 Other hydronephrosis: Secondary | ICD-10-CM | POA: Diagnosis not present

## 2020-10-25 LAB — URINALYSIS, ROUTINE W REFLEX MICROSCOPIC
Bilirubin, UA: NEGATIVE
Glucose, UA: NEGATIVE
Ketones, UA: NEGATIVE
Leukocytes,UA: NEGATIVE
Nitrite, UA: NEGATIVE
RBC, UA: NEGATIVE
Specific Gravity, UA: 1.015 (ref 1.005–1.030)
Urobilinogen, Ur: 0.2 mg/dL (ref 0.2–1.0)
pH, UA: 5 (ref 5.0–7.5)

## 2020-10-25 LAB — MICROSCOPIC EXAMINATION
Bacteria, UA: NONE SEEN
Epithelial Cells (non renal): NONE SEEN /hpf (ref 0–10)
RBC, Urine: NONE SEEN /hpf (ref 0–2)
Renal Epithel, UA: NONE SEEN /hpf
WBC, UA: NONE SEEN /hpf (ref 0–5)

## 2020-10-25 LAB — BLADDER SCAN AMB NON-IMAGING: Scan Result: 813

## 2020-10-25 MED ORDER — FINASTERIDE 5 MG PO TABS: 5.0000 mg | ORAL_TABLET | Freq: Every day | ORAL | 3 refills | Status: AC
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
## 2020-10-25 NOTE — Patient Instructions (Signed)
Finasteride (Propecia) tablets What is this medicine? FINASTERIDE (fi NAS teer ide) is used to treat male pattern baldness in men only. This medicine is not for use in women. This medicine may be used for other purposes; ask your health care provider or pharmacist if you have questions. COMMON BRAND NAME(S): Propecia What should I tell my health care provider before I take this medicine? They need to know if you have any of these conditions:  liver disease  an unusual or allergic reaction to finasteride, other medicines, foods, dyes, or preservatives  pregnant or trying to get pregnant  breast-feeding How should I use this medicine? Take this medicine by mouth with a glass of water. Follow the directions on the prescription label. You can take this medicine with or without food. Take your doses at regular intervals. Do not take your medicine more often than directed. Talk to your pediatrician regarding the use of this medicine in children. Special care may be needed. Overdosage: If you think you have taken too much of this medicine contact a poison control center or emergency room at once. NOTE: This medicine is only for you. Do not share this medicine with others. What if I miss a dose? If you miss a dose, take it as soon as you can. If it is almost time for your next dose, take only that dose. Do not take double or extra doses. What may interact with this medicine?  saw palmetto or other dietary supplements This list may not describe all possible interactions. Give your health care provider a list of all the medicines, herbs, non-prescription drugs, or dietary supplements you use. Also tell them if you smoke, drink alcohol, or use illegal drugs. Some items may interact with your medicine. What should I watch for while using this medicine? It may take at least three months of daily use of this medicine before you notice an improvement in you hair loss. You must continue to take this  medicine to maintain the results. If you stop taking this medicine, the effect will be reversed within 12 months. Do not donate blood while you are taking this medicine. This will prevent giving this medicine to a pregnant male through a blood transfusion. Ask your doctor or health care professional when it is safe to donate blood after you stop taking this medicine. Women who are pregnant or may get pregnant must not handle broken or crushed finasteride tablets. The active ingredient could harm the unborn baby. If a pregnant woman comes into contact with broken or crushed tablets she should check with her doctor or health care professional. Exposure to whole tablets is not expected to cause harm as long as they are not swallowed. This medicine can interfere with PSA laboratory tests for prostate cancer. If you are scheduled to have a lab test for prostate cancer, tell your doctor or health care professional that you are taking this medicine. This medicine may increase your risk of getting some cancers, like breast cancer. Talk with your doctor. What side effects may I notice from receiving this medicine? Side effects that you should report to your doctor or health care professional as soon as possible:  allergic reactions like skin rash, itching or hives, swelling of the face, lips, or tongue  changes in breast like lumps, pain or fluids leaking from the nipple  pain in the testicles Side effects that usually do not require medical attention (report to your doctor or health care professional if they continue or are  bothersome):  sexual difficulties like decreased sexual desire or ability to get an erection  small amount of semen released during sex This list may not describe all possible side effects. Call your doctor for medical advice about side effects. You may report side effects to FDA at 1-800-FDA-1088. Where should I keep my medicine? Keep out of the reach of children. Store at room  temperature between 15 and 30 degrees C (59 and 86 degrees F). Protect from moisture. Keep container tightly closed. Throw away any unused medicine after the expiration date. NOTE: This sheet is a summary. It may not cover all possible information. If you have questions about this medicine, talk to your doctor, pharmacist, or health care provider.  2021 Elsevier/Gold Standard (2016-10-06 09:36:49)

## 2020-10-25 NOTE — Progress Notes (Signed)
Urological Symptom Review  PVR 880m  Patient is experiencing the following symptoms: Get up at night to urinate   Review of Systems  Gastrointestinal (upper)  : Negative for upper GI symptoms  Gastrointestinal (lower) : Negative for lower GI symptoms  Constitutional : Negative for symptoms  Skin: Itching  Eyes: Negative for eye symptoms  Ear/Nose/Throat : Negative for Ear/Nose/Throat symptoms  Hematologic/Lymphatic: Negative for Hematologic/Lymphatic symptoms  Cardiovascular : Leg swelling  Respiratory : Negative for respiratory symptoms  Endocrine: Negative for endocrine symptoms  Musculoskeletal: Back pain Joint pain  Neurological: Negative for neurological symptoms  Psychologic: Negative for psychiatric symptoms

## 2020-10-28 DIAGNOSIS — Z299 Encounter for prophylactic measures, unspecified: Secondary | ICD-10-CM | POA: Diagnosis not present

## 2020-10-28 DIAGNOSIS — R42 Dizziness and giddiness: Secondary | ICD-10-CM | POA: Diagnosis not present

## 2020-11-01 DIAGNOSIS — I10 Essential (primary) hypertension: Secondary | ICD-10-CM | POA: Diagnosis not present

## 2020-11-05 DIAGNOSIS — Z299 Encounter for prophylactic measures, unspecified: Secondary | ICD-10-CM | POA: Diagnosis not present

## 2020-11-05 DIAGNOSIS — Z79899 Other long term (current) drug therapy: Secondary | ICD-10-CM | POA: Diagnosis not present

## 2020-11-05 DIAGNOSIS — M545 Low back pain, unspecified: Secondary | ICD-10-CM | POA: Diagnosis not present

## 2020-11-05 DIAGNOSIS — I10 Essential (primary) hypertension: Secondary | ICD-10-CM | POA: Diagnosis not present

## 2020-11-29 DIAGNOSIS — H3581 Retinal edema: Secondary | ICD-10-CM | POA: Diagnosis not present

## 2020-11-29 DIAGNOSIS — H35033 Hypertensive retinopathy, bilateral: Secondary | ICD-10-CM | POA: Diagnosis not present

## 2020-11-29 DIAGNOSIS — H4311 Vitreous hemorrhage, right eye: Secondary | ICD-10-CM | POA: Diagnosis not present

## 2020-11-29 DIAGNOSIS — H211X1 Other vascular disorders of iris and ciliary body, right eye: Secondary | ICD-10-CM | POA: Diagnosis not present

## 2020-11-29 DIAGNOSIS — H3411 Central retinal artery occlusion, right eye: Secondary | ICD-10-CM | POA: Diagnosis not present

## 2020-11-30 DIAGNOSIS — I10 Essential (primary) hypertension: Secondary | ICD-10-CM | POA: Diagnosis not present

## 2020-12-06 DIAGNOSIS — Z299 Encounter for prophylactic measures, unspecified: Secondary | ICD-10-CM | POA: Diagnosis not present

## 2020-12-06 DIAGNOSIS — I4891 Unspecified atrial fibrillation: Secondary | ICD-10-CM | POA: Diagnosis not present

## 2020-12-06 DIAGNOSIS — I270 Primary pulmonary hypertension: Secondary | ICD-10-CM | POA: Diagnosis not present

## 2020-12-06 DIAGNOSIS — M545 Low back pain, unspecified: Secondary | ICD-10-CM | POA: Diagnosis not present

## 2020-12-06 DIAGNOSIS — I272 Pulmonary hypertension, unspecified: Secondary | ICD-10-CM | POA: Diagnosis not present

## 2020-12-13 DIAGNOSIS — H3411 Central retinal artery occlusion, right eye: Secondary | ICD-10-CM | POA: Diagnosis not present

## 2020-12-28 DIAGNOSIS — I4819 Other persistent atrial fibrillation: Secondary | ICD-10-CM | POA: Insufficient documentation

## 2020-12-28 DIAGNOSIS — I10 Essential (primary) hypertension: Secondary | ICD-10-CM | POA: Insufficient documentation

## 2020-12-28 DIAGNOSIS — I272 Pulmonary hypertension, unspecified: Secondary | ICD-10-CM | POA: Insufficient documentation

## 2020-12-28 DIAGNOSIS — E785 Hyperlipidemia, unspecified: Secondary | ICD-10-CM | POA: Insufficient documentation
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
## 2020-12-29 ENCOUNTER — Ambulatory Visit: Payer: 59 | Admitting: Cardiology

## 2020-12-29 DIAGNOSIS — E785 Hyperlipidemia, unspecified: Secondary | ICD-10-CM

## 2020-12-29 DIAGNOSIS — I10 Essential (primary) hypertension: Secondary | ICD-10-CM

## 2020-12-29 DIAGNOSIS — I272 Pulmonary hypertension, unspecified: Secondary | ICD-10-CM

## 2020-12-29 DIAGNOSIS — I4819 Other persistent atrial fibrillation: Secondary | ICD-10-CM

## 2020-12-31 DIAGNOSIS — I10 Essential (primary) hypertension: Secondary | ICD-10-CM | POA: Diagnosis not present

## 2021-01-05 DIAGNOSIS — N183 Chronic kidney disease, stage 3 unspecified: Secondary | ICD-10-CM | POA: Diagnosis not present

## 2021-01-05 DIAGNOSIS — I10 Essential (primary) hypertension: Secondary | ICD-10-CM | POA: Diagnosis not present

## 2021-01-05 DIAGNOSIS — M545 Low back pain, unspecified: Secondary | ICD-10-CM | POA: Diagnosis not present

## 2021-01-05 DIAGNOSIS — I272 Pulmonary hypertension, unspecified: Secondary | ICD-10-CM | POA: Diagnosis not present

## 2021-01-05 DIAGNOSIS — Z299 Encounter for prophylactic measures, unspecified: Secondary | ICD-10-CM | POA: Diagnosis not present

## 2021-01-17 ENCOUNTER — Ambulatory Visit (INDEPENDENT_AMBULATORY_CARE_PROVIDER_SITE_OTHER): Payer: Medicare Other | Admitting: Urology

## 2021-01-17 ENCOUNTER — Other Ambulatory Visit: Payer: Self-pay

## 2021-01-17 VITALS — BP 147/55 | HR 56 | Temp 98.0°F

## 2021-01-17 DIAGNOSIS — N401 Enlarged prostate with lower urinary tract symptoms: Secondary | ICD-10-CM

## 2021-01-17 DIAGNOSIS — N138 Other obstructive and reflux uropathy: Secondary | ICD-10-CM | POA: Diagnosis not present

## 2021-01-17 DIAGNOSIS — R339 Retention of urine, unspecified: Secondary | ICD-10-CM | POA: Diagnosis not present

## 2021-01-17 LAB — URINALYSIS, ROUTINE W REFLEX MICROSCOPIC
Bilirubin, UA: NEGATIVE
Glucose, UA: NEGATIVE
Ketones, UA: NEGATIVE
Leukocytes,UA: NEGATIVE
Nitrite, UA: NEGATIVE
Protein,UA: NEGATIVE
RBC, UA: NEGATIVE
Specific Gravity, UA: 1.015 (ref 1.005–1.030)
Urobilinogen, Ur: 0.2 mg/dL (ref 0.2–1.0)
pH, UA: 5.5 (ref 5.0–7.5)

## 2021-01-17 LAB — BLADDER SCAN AMB NON-IMAGING: Scan Result: 871

## 2021-01-17 NOTE — Patient Instructions (Signed)
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
## 2021-01-17 NOTE — Progress Notes (Signed)
post void residual= 871  Urological Symptom Review  Patient is experiencing the following symptoms: Get up at night to urinate   Review of Systems  Gastrointestinal (upper)  : Negative for upper GI symptoms  Gastrointestinal (lower) : Negative for lower GI symptoms  Constitutional : Negative for symptoms  Skin: Negative for skin symptoms  Eyes: Negative for eye symptoms  Ear/Nose/Throat : Negative for Ear/Nose/Throat symptoms  Hematologic/Lymphatic: Negative for Hematologic/Lymphatic symptoms  Cardiovascular : Leg swelling  Respiratory : Negative for respiratory symptoms  Endocrine: Negative for endocrine symptoms  Musculoskeletal: Back pain Joint pain  Neurological: Negative for neurological symptoms  Psychologic: Negative for psychiatric symptoms

## 2021-02-01 DIAGNOSIS — I10 Essential (primary) hypertension: Secondary | ICD-10-CM | POA: Diagnosis not present

## 2021-02-02 DIAGNOSIS — Z299 Encounter for prophylactic measures, unspecified: Secondary | ICD-10-CM | POA: Diagnosis not present

## 2021-02-02 DIAGNOSIS — I10 Essential (primary) hypertension: Secondary | ICD-10-CM | POA: Diagnosis not present

## 2021-02-02 DIAGNOSIS — M545 Low back pain, unspecified: Secondary | ICD-10-CM | POA: Diagnosis not present

## 2021-02-07 DIAGNOSIS — M25472 Effusion, left ankle: Secondary | ICD-10-CM | POA: Diagnosis not present

## 2021-02-07 DIAGNOSIS — M7989 Other specified soft tissue disorders: Secondary | ICD-10-CM | POA: Diagnosis not present

## 2021-02-07 DIAGNOSIS — M25572 Pain in left ankle and joints of left foot: Secondary | ICD-10-CM | POA: Diagnosis not present

## 2021-02-07 DIAGNOSIS — S93412A Sprain of calcaneofibular ligament of left ankle, initial encounter: Secondary | ICD-10-CM | POA: Diagnosis not present

## 2021-02-07 DIAGNOSIS — M79672 Pain in left foot: Secondary | ICD-10-CM | POA: Diagnosis not present

## 2021-02-08 DIAGNOSIS — I10 Essential (primary) hypertension: Secondary | ICD-10-CM | POA: Diagnosis not present

## 2021-02-08 DIAGNOSIS — M25472 Effusion, left ankle: Secondary | ICD-10-CM | POA: Diagnosis not present

## 2021-02-08 DIAGNOSIS — Z299 Encounter for prophylactic measures, unspecified: Secondary | ICD-10-CM | POA: Diagnosis not present

## 2021-02-09 ENCOUNTER — Ambulatory Visit (INDEPENDENT_AMBULATORY_CARE_PROVIDER_SITE_OTHER): Payer: Medicare Other | Admitting: Orthopaedic Surgery

## 2021-02-09 ENCOUNTER — Other Ambulatory Visit: Payer: Self-pay

## 2021-02-09 ENCOUNTER — Encounter: Payer: Self-pay | Admitting: Orthopaedic Surgery

## 2021-02-09 DIAGNOSIS — S96912A Strain of unspecified muscle and tendon at ankle and foot level, left foot, initial encounter: Secondary | ICD-10-CM | POA: Diagnosis not present

## 2021-02-09 DIAGNOSIS — W19XXXA Unspecified fall, initial encounter: Secondary | ICD-10-CM | POA: Diagnosis not present

## 2021-02-09 NOTE — Patient Instructions (Signed)
Come to [place] office on Tuesday.
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
## 2021-02-15 ENCOUNTER — Ambulatory Visit (INDEPENDENT_AMBULATORY_CARE_PROVIDER_SITE_OTHER): Payer: Medicare Other | Admitting: Orthopaedic Surgery

## 2021-02-15 ENCOUNTER — Other Ambulatory Visit: Payer: Self-pay

## 2021-02-15 ENCOUNTER — Encounter: Payer: Self-pay | Admitting: Orthopaedic Surgery

## 2021-02-15 VITALS — BP 104/52 | HR 87 | Ht 71.0 in

## 2021-02-15 DIAGNOSIS — M25462 Effusion, left knee: Secondary | ICD-10-CM

## 2021-02-15 DIAGNOSIS — S96912D Strain of unspecified muscle and tendon at ankle and foot level, left foot, subsequent encounter: Secondary | ICD-10-CM | POA: Diagnosis not present

## 2021-02-15 DIAGNOSIS — M10062 Idiopathic gout, left knee: Secondary | ICD-10-CM

## 2021-02-15 MED ORDER — ALLOPURINOL 100 MG PO TABS: 100.0000 mg | ORAL_TABLET | Freq: Every day | ORAL | 5 refills | Status: AC

## 2021-02-15 NOTE — Progress Notes (Signed)
My ankle is better.  My knee hurts.  He has been using the CAM walker for the left ankle and he has much less pain.    However, he has pain in the left knee with effusion.  He had no redness, no fever, no chills, no numbness.  It hurt to bear weight with the left knee.  Left knee has effusion, slight crepitus, no redness, ROM 0 to 95, more medial pain.  NV intact.  The left foot and ankle have some slight swelling but significantly improved since the last time.  NV intact.  PROCEDURE NOTE:  The patient request injection, verbal consent was obtained.  The left knee was prepped appropriately after time out was performed.   Sterile technique was observed and anesthesia was provided by ethyl chloride and a 20-gauge needle was used to inject the knee area.  A 16-gauge needle was then used to aspirate the knee.  Color of fluid aspirated was milk colored, white with red tinge  Total cc's aspirated was 28.    Injection of 1 cc of DepoMedrol 40 with several cc's of plain xylocaine was then performed.  A band aid dressing was applied.  The patient was advised to apply ice later today and tomorrow to the injection sight as needed.   It looks like gout.  He denies family history.  Encounter Diagnoses  Name Primary?   Effusion, left knee Yes   Strain of left ankle, subsequent encounter    Acute idiopathic gout of left knee    I will send aspirate for culture and sensitivity, crystals and cell count.  I will get serum uric acid.  I will begin allopurinol 100 mgm daily.  Return in one week.  Call if any problem.  Precautions discussed.  Electronically Signed Sanjuana Kava, MD 6/14/20222:15 PM

## 2021-02-16 LAB — URIC ACID: Uric Acid, Serum: 9.8 mg/dL — ABNORMAL HIGH (ref 4.0–8.0)

## 2021-02-18 LAB — WOUND CULTURE
MICRO NUMBER:: 12005336
SPECIMEN QUALITY:: ADEQUATE

## 2021-02-18 LAB — SYNOVIAL FLUID ANALYSIS, COMPLETE
Basophils, %: 0 %
Eosinophils-Synovial: 0 % (ref 0–2)
Lymphocytes-Synovial Fld: 6 % (ref 0–74)
Monocyte/Macrophage: 3 % (ref 0–69)
Neutrophil, Synovial: 91 % — ABNORMAL HIGH (ref 0–24)
Synoviocytes, %: 0 % (ref 0–15)
WBC, Synovial: 59010 cells/uL — ABNORMAL HIGH (ref <150)

## 2021-02-19 ENCOUNTER — Emergency Department (HOSPITAL_COMMUNITY): Payer: Medicare Other

## 2021-02-19 ENCOUNTER — Inpatient Hospital Stay (HOSPITAL_COMMUNITY)
Admission: EM | Admit: 2021-02-19 | Discharge: 2021-03-08 | DRG: 871 | Disposition: A | Discharge status: Other Acute Inpatient Hospital | Payer: Medicare Other | Attending: Internal Medicine | Admitting: Internal Medicine

## 2021-02-19 ENCOUNTER — Encounter (HOSPITAL_COMMUNITY): Payer: Self-pay | Admitting: Emergency Medicine

## 2021-02-19 ENCOUNTER — Other Ambulatory Visit: Payer: Self-pay

## 2021-02-19 DIAGNOSIS — E872 Acidosis: Secondary | ICD-10-CM | POA: Diagnosis present

## 2021-02-19 DIAGNOSIS — M009 Pyogenic arthritis, unspecified: Secondary | ICD-10-CM | POA: Diagnosis present

## 2021-02-19 DIAGNOSIS — I6782 Cerebral ischemia: Secondary | ICD-10-CM | POA: Diagnosis not present

## 2021-02-19 DIAGNOSIS — Z95828 Presence of other vascular implants and grafts: Secondary | ICD-10-CM

## 2021-02-19 DIAGNOSIS — R4182 Altered mental status, unspecified: Secondary | ICD-10-CM | POA: Diagnosis not present

## 2021-02-19 DIAGNOSIS — K921 Melena: Secondary | ICD-10-CM | POA: Diagnosis not present

## 2021-02-19 DIAGNOSIS — M00862 Arthritis due to other bacteria, left knee: Secondary | ICD-10-CM | POA: Diagnosis not present

## 2021-02-19 DIAGNOSIS — G902 Horner's syndrome: Secondary | ICD-10-CM | POA: Diagnosis not present

## 2021-02-19 DIAGNOSIS — M1A0621 Idiopathic chronic gout, left knee, with tophus (tophi): Secondary | ICD-10-CM | POA: Diagnosis not present

## 2021-02-19 DIAGNOSIS — I517 Cardiomegaly: Secondary | ICD-10-CM | POA: Diagnosis not present

## 2021-02-19 DIAGNOSIS — I4819 Other persistent atrial fibrillation: Secondary | ICD-10-CM | POA: Diagnosis present

## 2021-02-19 DIAGNOSIS — K219 Gastro-esophageal reflux disease without esophagitis: Secondary | ICD-10-CM | POA: Diagnosis present

## 2021-02-19 DIAGNOSIS — B965 Pseudomonas (aeruginosa) (mallei) (pseudomallei) as the cause of diseases classified elsewhere: Secondary | ICD-10-CM | POA: Diagnosis not present

## 2021-02-19 DIAGNOSIS — Z0389 Encounter for observation for other suspected diseases and conditions ruled out: Secondary | ICD-10-CM | POA: Diagnosis not present

## 2021-02-19 DIAGNOSIS — E860 Dehydration: Secondary | ICD-10-CM | POA: Diagnosis present

## 2021-02-19 DIAGNOSIS — R0902 Hypoxemia: Secondary | ICD-10-CM | POA: Diagnosis not present

## 2021-02-19 DIAGNOSIS — J320 Chronic maxillary sinusitis: Secondary | ICD-10-CM | POA: Diagnosis present

## 2021-02-19 DIAGNOSIS — G928 Other toxic encephalopathy: Secondary | ICD-10-CM | POA: Diagnosis not present

## 2021-02-19 DIAGNOSIS — N184 Chronic kidney disease, stage 4 (severe): Secondary | ICD-10-CM | POA: Diagnosis present

## 2021-02-19 DIAGNOSIS — I272 Pulmonary hypertension, unspecified: Secondary | ICD-10-CM | POA: Diagnosis present

## 2021-02-19 DIAGNOSIS — Z7901 Long term (current) use of anticoagulants: Secondary | ICD-10-CM

## 2021-02-19 DIAGNOSIS — E861 Hypovolemia: Secondary | ICD-10-CM | POA: Diagnosis present

## 2021-02-19 DIAGNOSIS — Z8673 Personal history of transient ischemic attack (TIA), and cerebral infarction without residual deficits: Secondary | ICD-10-CM

## 2021-02-19 DIAGNOSIS — I10 Essential (primary) hypertension: Secondary | ICD-10-CM | POA: Diagnosis not present

## 2021-02-19 DIAGNOSIS — R6520 Severe sepsis without septic shock: Secondary | ICD-10-CM | POA: Diagnosis present

## 2021-02-19 DIAGNOSIS — Z20822 Contact with and (suspected) exposure to covid-19: Secondary | ICD-10-CM | POA: Diagnosis present

## 2021-02-19 DIAGNOSIS — G9341 Metabolic encephalopathy: Secondary | ICD-10-CM

## 2021-02-19 DIAGNOSIS — N138 Other obstructive and reflux uropathy: Secondary | ICD-10-CM | POA: Diagnosis present

## 2021-02-19 DIAGNOSIS — M25462 Effusion, left knee: Secondary | ICD-10-CM | POA: Diagnosis not present

## 2021-02-19 DIAGNOSIS — W19XXXA Unspecified fall, initial encounter: Secondary | ICD-10-CM | POA: Diagnosis not present

## 2021-02-19 DIAGNOSIS — E222 Syndrome of inappropriate secretion of antidiuretic hormone: Secondary | ICD-10-CM | POA: Diagnosis present

## 2021-02-19 DIAGNOSIS — Z66 Do not resuscitate: Secondary | ICD-10-CM | POA: Diagnosis not present

## 2021-02-19 DIAGNOSIS — I4891 Unspecified atrial fibrillation: Secondary | ICD-10-CM | POA: Diagnosis not present

## 2021-02-19 DIAGNOSIS — Z043 Encounter for examination and observation following other accident: Secondary | ICD-10-CM | POA: Diagnosis not present

## 2021-02-19 DIAGNOSIS — Z87891 Personal history of nicotine dependence: Secondary | ICD-10-CM

## 2021-02-19 DIAGNOSIS — R350 Frequency of micturition: Secondary | ICD-10-CM | POA: Diagnosis present

## 2021-02-19 DIAGNOSIS — A419 Sepsis, unspecified organism: Secondary | ICD-10-CM | POA: Diagnosis present

## 2021-02-19 DIAGNOSIS — M2540 Effusion, unspecified joint: Secondary | ICD-10-CM

## 2021-02-19 DIAGNOSIS — N179 Acute kidney failure, unspecified: Secondary | ICD-10-CM | POA: Diagnosis present

## 2021-02-19 DIAGNOSIS — R296 Repeated falls: Secondary | ICD-10-CM | POA: Diagnosis not present

## 2021-02-19 DIAGNOSIS — N401 Enlarged prostate with lower urinary tract symptoms: Secondary | ICD-10-CM | POA: Diagnosis present

## 2021-02-19 DIAGNOSIS — Z79899 Other long term (current) drug therapy: Secondary | ICD-10-CM | POA: Diagnosis not present

## 2021-02-19 DIAGNOSIS — S069X9A Unspecified intracranial injury with loss of consciousness of unspecified duration, initial encounter: Secondary | ICD-10-CM | POA: Diagnosis not present

## 2021-02-19 DIAGNOSIS — R410 Disorientation, unspecified: Secondary | ICD-10-CM | POA: Diagnosis not present

## 2021-02-19 DIAGNOSIS — A4152 Sepsis due to Pseudomonas: Principal | ICD-10-CM | POA: Diagnosis present

## 2021-02-19 DIAGNOSIS — M7122 Synovial cyst of popliteal space [Baker], left knee: Secondary | ICD-10-CM | POA: Diagnosis not present

## 2021-02-19 DIAGNOSIS — M19041 Primary osteoarthritis, right hand: Secondary | ICD-10-CM | POA: Diagnosis present

## 2021-02-19 DIAGNOSIS — I129 Hypertensive chronic kidney disease with stage 1 through stage 4 chronic kidney disease, or unspecified chronic kidney disease: Secondary | ICD-10-CM | POA: Diagnosis present

## 2021-02-19 DIAGNOSIS — M7989 Other specified soft tissue disorders: Secondary | ICD-10-CM | POA: Diagnosis not present

## 2021-02-19 DIAGNOSIS — M10062 Idiopathic gout, left knee: Secondary | ICD-10-CM | POA: Diagnosis present

## 2021-02-19 DIAGNOSIS — M1712 Unilateral primary osteoarthritis, left knee: Secondary | ICD-10-CM | POA: Diagnosis not present

## 2021-02-19 DIAGNOSIS — M19042 Primary osteoarthritis, left hand: Secondary | ICD-10-CM | POA: Diagnosis present

## 2021-02-19 DIAGNOSIS — D6489 Other specified anemias: Secondary | ICD-10-CM | POA: Diagnosis present

## 2021-02-19 LAB — CBC WITH DIFFERENTIAL/PLATELET
Abs Immature Granulocytes: 0.49 10*3/uL — ABNORMAL HIGH (ref 0.00–0.07)
Basophils Absolute: 0.1 10*3/uL (ref 0.0–0.1)
Basophils Relative: 0 %
Eosinophils Absolute: 0 10*3/uL (ref 0.0–0.5)
Eosinophils Relative: 0 %
HCT: 36 % — ABNORMAL LOW (ref 39.0–52.0)
Hemoglobin: 11.7 g/dL — ABNORMAL LOW (ref 13.0–17.0)
Immature Granulocytes: 2 %
Lymphocytes Relative: 3 %
Lymphs Abs: 0.6 10*3/uL — ABNORMAL LOW (ref 0.7–4.0)
MCH: 31.3 pg (ref 26.0–34.0)
MCHC: 32.5 g/dL (ref 30.0–36.0)
MCV: 96.3 fL (ref 80.0–100.0)
Monocytes Absolute: 0.9 10*3/uL (ref 0.1–1.0)
Monocytes Relative: 4 %
Neutro Abs: 20 10*3/uL — ABNORMAL HIGH (ref 1.7–7.7)
Neutrophils Relative %: 91 %
Platelets: 493 10*3/uL — ABNORMAL HIGH (ref 150–400)
RBC: 3.74 MIL/uL — ABNORMAL LOW (ref 4.22–5.81)
RDW: 14.6 % (ref 11.5–15.5)
WBC: 22.1 10*3/uL — ABNORMAL HIGH (ref 4.0–10.5)
nRBC: 0 % (ref 0.0–0.2)

## 2021-02-19 LAB — COMPREHENSIVE METABOLIC PANEL
ALT: 24 U/L (ref 0–44)
AST: 20 U/L (ref 15–41)
Albumin: 2.9 g/dL — ABNORMAL LOW (ref 3.5–5.0)
Alkaline Phosphatase: 108 U/L (ref 38–126)
Anion gap: 11 (ref 5–15)
BUN: 70 mg/dL — ABNORMAL HIGH (ref 8–23)
CO2: 21 mmol/L — ABNORMAL LOW (ref 22–32)
Calcium: 9.7 mg/dL (ref 8.9–10.3)
Chloride: 100 mmol/L (ref 98–111)
Creatinine, Ser: 2.16 mg/dL — ABNORMAL HIGH (ref 0.61–1.24)
GFR, Estimated: 29 mL/min — ABNORMAL LOW (ref >60)
Glucose, Bld: 116 mg/dL — ABNORMAL HIGH (ref 70–99)
Potassium: 4.8 mmol/L (ref 3.5–5.1)
Sodium: 132 mmol/L — ABNORMAL LOW (ref 135–145)
Total Bilirubin: 1.1 mg/dL (ref 0.3–1.2)
Total Protein: 6.8 g/dL (ref 6.5–8.1)

## 2021-02-19 LAB — ETHANOL: Alcohol, Ethyl (B): <10 mg/dL (ref <10)

## 2021-02-19 LAB — PROTIME-INR
INR: 2 — ABNORMAL HIGH (ref 0.8–1.2)
Prothrombin Time: 22.2 seconds — ABNORMAL HIGH (ref 11.4–15.2)

## 2021-02-19 LAB — AMMONIA: Ammonia: <9 umol/L — ABNORMAL LOW (ref 9–35)

## 2021-02-19 LAB — TROPONIN I (HIGH SENSITIVITY): Troponin I (High Sensitivity): 15 ng/L (ref <18)

## 2021-02-19 LAB — SYNOVIAL CELL COUNT + DIFF, W/ CRYSTALS
Eosinophils-Synovial: 0 % (ref 0–1)
Lymphocytes-Synovial Fld: 4 % (ref 0–20)
Monocyte-Macrophage-Synovial Fluid: 1 % — ABNORMAL LOW (ref 50–90)
Neutrophil, Synovial: 95 % — ABNORMAL HIGH (ref 0–25)
Other Cells-SYN: 0
WBC, Synovial: 2457 /mm3 — ABNORMAL HIGH (ref 0–200)

## 2021-02-19 LAB — URINALYSIS, ROUTINE W REFLEX MICROSCOPIC
Bacteria, UA: NONE SEEN
Bilirubin Urine: NEGATIVE
Glucose, UA: NEGATIVE mg/dL
Ketones, ur: NEGATIVE mg/dL
Leukocytes,Ua: NEGATIVE
Nitrite: NEGATIVE
Protein, ur: NEGATIVE mg/dL
Specific Gravity, Urine: 1.012 (ref 1.005–1.030)
pH: 5 (ref 5.0–8.0)

## 2021-02-19 LAB — SEDIMENTATION RATE: Sed Rate: 106 mm/hr — ABNORMAL HIGH (ref 0–16)

## 2021-02-19 LAB — CK: Total CK: 27 U/L — ABNORMAL LOW (ref 49–397)

## 2021-02-19 LAB — RESP PANEL BY RT-PCR (FLU A&B, COVID) ARPGX2
Influenza A by PCR: NEGATIVE
Influenza B by PCR: NEGATIVE
SARS Coronavirus 2 by RT PCR: NEGATIVE

## 2021-02-19 LAB — LACTIC ACID, PLASMA
Lactic Acid, Venous: 1.7 mmol/L (ref 0.5–1.9)
Lactic Acid, Venous: 2 mmol/L (ref 0.5–1.9)

## 2021-02-19 LAB — APTT: aPTT: 43 seconds — ABNORMAL HIGH (ref 24–36)

## 2021-02-19 LAB — C-REACTIVE PROTEIN: CRP: 19.4 mg/dL — ABNORMAL HIGH (ref <1.0)

## 2021-02-19 LAB — MAGNESIUM: Magnesium: 2.3 mg/dL (ref 1.7–2.4)

## 2021-02-19 MED ORDER — VANCOMYCIN HCL IN DEXTROSE 1-5 GM/200ML-% IV SOLN: 1000.0000 mg | Freq: Once | INTRAVENOUS | Status: DC

## 2021-02-19 MED ORDER — ACETAMINOPHEN 650 MG RE SUPP: 650.0000 mg | Freq: Four times a day (QID) | RECTAL | Status: DC

## 2021-02-19 MED ORDER — TAMSULOSIN HCL 0.4 MG PO CAPS
0.4000 mg | ORAL_CAPSULE | Freq: Two times a day (BID) | ORAL | Status: DC
  Administered 2021-02-19 – 2021-03-08 (×34): 0.4 mg via ORAL
  Filled 2021-02-19 (×34): qty 1

## 2021-02-19 MED ORDER — SODIUM CHLORIDE 0.9 % IV BOLUS
1000.0000 mL | Freq: Once | INTRAVENOUS | Status: AC
  Administered 2021-02-19: 1000 mL via INTRAVENOUS

## 2021-02-19 MED ORDER — FUROSEMIDE 40 MG PO TABS
40.0000 mg | ORAL_TABLET | Freq: Every day | ORAL | Status: DC
  Filled 2021-02-19: qty 1

## 2021-02-19 MED ORDER — SODIUM CHLORIDE 0.9 % IV SOLN
2.0000 g | INTRAVENOUS | Status: DC
  Administered 2021-02-20: 2 g via INTRAVENOUS
  Filled 2021-02-19: qty 2

## 2021-02-19 MED ORDER — METOPROLOL SUCCINATE ER 50 MG PO TB24
50.0000 mg | ORAL_TABLET | Freq: Two times a day (BID) | ORAL | Status: DC
  Administered 2021-02-19 – 2021-03-03 (×21): 50 mg via ORAL
  Filled 2021-02-19 (×27): qty 1

## 2021-02-19 MED ORDER — ALLOPURINOL 100 MG PO TABS
100.0000 mg | ORAL_TABLET | Freq: Every day | ORAL | Status: DC
  Administered 2021-02-20 – 2021-03-08 (×17): 100 mg via ORAL
  Filled 2021-02-19 (×18): qty 1

## 2021-02-19 MED ORDER — APIXABAN 2.5 MG PO TABS
2.5000 mg | ORAL_TABLET | Freq: Two times a day (BID) | ORAL | Status: DC
  Administered 2021-02-19: 2.5 mg via ORAL
  Filled 2021-02-19 (×2): qty 1

## 2021-02-19 MED ORDER — LACTATED RINGERS IV SOLN: INTRAVENOUS | Status: DC

## 2021-02-19 MED ORDER — METRONIDAZOLE 500 MG/100ML IV SOLN
500.0000 mg | Freq: Once | INTRAVENOUS | Status: AC
  Administered 2021-02-19: 500 mg via INTRAVENOUS
  Filled 2021-02-19: qty 100

## 2021-02-19 MED ORDER — IRBESARTAN 150 MG PO TABS
150.0000 mg | ORAL_TABLET | Freq: Every day | ORAL | Status: DC
  Filled 2021-02-19: qty 1

## 2021-02-19 MED ORDER — PANTOPRAZOLE SODIUM 40 MG PO TBEC
40.0000 mg | DELAYED_RELEASE_TABLET | Freq: Every evening | ORAL | Status: DC
  Administered 2021-02-19 – 2021-03-07 (×16): 40 mg via ORAL
  Filled 2021-02-19 (×17): qty 1

## 2021-02-19 MED ORDER — FINASTERIDE 5 MG PO TABS
5.0000 mg | ORAL_TABLET | Freq: Every day | ORAL | Status: DC
  Administered 2021-02-20 – 2021-03-08 (×17): 5 mg via ORAL
  Filled 2021-02-19 (×17): qty 1

## 2021-02-19 MED ORDER — SODIUM CHLORIDE 0.9 % IV SOLN
2.0000 g | Freq: Once | INTRAVENOUS | Status: AC
  Administered 2021-02-19: 2 g via INTRAVENOUS
  Filled 2021-02-19: qty 2

## 2021-02-19 MED ORDER — ATORVASTATIN CALCIUM 40 MG PO TABS
40.0000 mg | ORAL_TABLET | Freq: Every day | ORAL | Status: DC
  Filled 2021-02-19: qty 1

## 2021-02-19 MED ORDER — ACETAMINOPHEN 325 MG PO TABS
650.0000 mg | ORAL_TABLET | Freq: Four times a day (QID) | ORAL | Status: DC
  Administered 2021-02-19 – 2021-03-08 (×65): 650 mg via ORAL
  Filled 2021-02-19 (×67): qty 2

## 2021-02-19 MED ORDER — MORPHINE SULFATE (PF) 2 MG/ML IV SOLN
2.0000 mg | INTRAVENOUS | Status: DC | PRN
  Administered 2021-02-20 – 2021-02-25 (×20): 2 mg via INTRAVENOUS
  Filled 2021-02-19 (×22): qty 1

## 2021-02-19 MED ORDER — VANCOMYCIN HCL IN DEXTROSE 1-5 GM/200ML-% IV SOLN: 1000.0000 mg | INTRAVENOUS | Status: DC

## 2021-02-19 MED ORDER — VANCOMYCIN HCL 1500 MG/300ML IV SOLN
1500.0000 mg | Freq: Once | INTRAVENOUS | Status: AC
  Administered 2021-02-19: 1500 mg via INTRAVENOUS

## 2021-02-19 NOTE — ED Triage Notes (Signed)
[facility] called out for AMS; family believes pt took too much Oxycodone and Benadryl, family reports dark urine
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
## 2021-02-19 NOTE — Progress Notes (Signed)
Pharmacy Antibiotic Note  [facility] a 87 y.o. male admitted on 02/19/2021 with sepsis.  Pharmacy has been consulted for vancomycin and cefepime dosing.  Plan: Vancomycin '1000mg'$  IV every 24 hours.  Goal trough 15-20 mcg/mL. Cefepime 2gm IV every 24 hours.  Medical History: Past Medical History:  Diagnosis Date   Acid reflux    Arthritis    Gout    Heart murmur    Hypertension    Kidney stones    Ruptured disc, cervical     Allergies:  No Known Allergies  Filed Weights   02/19/21 1248  Weight: 79.8 kg (176 lb)    CBC Latest Ref Rng & Units 02/19/2021 08/21/2020 08/19/2020  WBC 4.0 - 10.5 K/uL 22.1(H) 5.0 5.8  Hemoglobin 13.0 - 17.0 g/dL 11.7(L) 10.7(L) 12.9(L)  Hematocrit 39.0 - 52.0 % 36.0(L) 29.9(L) 40.1  Platelets 150 - 400 K/uL 493(H) 106(L) 138(L)     Estimated Creatinine Clearance: 27.1 mL/min (A) (by C-G formula based on SCr of 2.16 mg/dL (H)).  Antibiotics Given (last 72 hours)     Date/Time Action Medication Dose Rate   02/19/21 1437 New Bag/Given   ceFEPIme (MAXIPIME) 2 g in sodium chloride 0.9 % 100 mL IVPB 2 g 200 mL/hr       Antimicrobials this admission: Cefepime 02/19/2021  >>  vancomycin 02/19/2021  >>  Metronidazole 02/19/2021   x 1   Microbiology results: 02/19/2021  BCx: sent 02/19/2021  UCx: sent 02/19/2021  Resp Panel: sent    Thank you for allowing pharmacy to be a part of this patient's care.  Thomasenia Sales, PharmD Clinical Pharmacist

## 2021-02-19 NOTE — ED Notes (Signed)
EDP at bedside for MSE.  

## 2021-02-19 NOTE — ED Notes (Signed)
Call placed to [facility] for Vancomycin
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
## 2021-02-20 DIAGNOSIS — N138 Other obstructive and reflux uropathy: Secondary | ICD-10-CM

## 2021-02-20 DIAGNOSIS — A419 Sepsis, unspecified organism: Secondary | ICD-10-CM

## 2021-02-20 DIAGNOSIS — G928 Other toxic encephalopathy: Secondary | ICD-10-CM

## 2021-02-20 DIAGNOSIS — I272 Pulmonary hypertension, unspecified: Secondary | ICD-10-CM

## 2021-02-20 DIAGNOSIS — N401 Enlarged prostate with lower urinary tract symptoms: Secondary | ICD-10-CM

## 2021-02-20 DIAGNOSIS — I4819 Other persistent atrial fibrillation: Secondary | ICD-10-CM

## 2021-02-20 DIAGNOSIS — M009 Pyogenic arthritis, unspecified: Secondary | ICD-10-CM

## 2021-02-20 LAB — BASIC METABOLIC PANEL
Anion gap: 7 (ref 5–15)
BUN: 59 mg/dL — ABNORMAL HIGH (ref 8–23)
CO2: 21 mmol/L — ABNORMAL LOW (ref 22–32)
Calcium: 9.1 mg/dL (ref 8.9–10.3)
Chloride: 103 mmol/L (ref 98–111)
Creatinine, Ser: 1.79 mg/dL — ABNORMAL HIGH (ref 0.61–1.24)
GFR, Estimated: 37 mL/min — ABNORMAL LOW (ref >60)
Glucose, Bld: 123 mg/dL — ABNORMAL HIGH (ref 70–99)
Potassium: 4.5 mmol/L (ref 3.5–5.1)
Sodium: 131 mmol/L — ABNORMAL LOW (ref 135–145)

## 2021-02-20 LAB — CBC WITH DIFFERENTIAL/PLATELET
Abs Immature Granulocytes: 0.42 10*3/uL — ABNORMAL HIGH (ref 0.00–0.07)
Basophils Absolute: 0.1 10*3/uL (ref 0.0–0.1)
Basophils Relative: 0 %
Eosinophils Absolute: 0 10*3/uL (ref 0.0–0.5)
Eosinophils Relative: 0 %
HCT: 32.9 % — ABNORMAL LOW (ref 39.0–52.0)
Hemoglobin: 10.5 g/dL — ABNORMAL LOW (ref 13.0–17.0)
Immature Granulocytes: 2 %
Lymphocytes Relative: 4 %
Lymphs Abs: 0.8 10*3/uL (ref 0.7–4.0)
MCH: 31.3 pg (ref 26.0–34.0)
MCHC: 31.9 g/dL (ref 30.0–36.0)
MCV: 98.2 fL (ref 80.0–100.0)
Monocytes Absolute: 1 10*3/uL (ref 0.1–1.0)
Monocytes Relative: 5 %
Neutro Abs: 16.6 10*3/uL — ABNORMAL HIGH (ref 1.7–7.7)
Neutrophils Relative %: 89 %
Platelets: 456 10*3/uL — ABNORMAL HIGH (ref 150–400)
RBC: 3.35 MIL/uL — ABNORMAL LOW (ref 4.22–5.81)
RDW: 14.4 % (ref 11.5–15.5)
WBC: 18.8 10*3/uL — ABNORMAL HIGH (ref 4.0–10.5)
nRBC: 0 % (ref 0.0–0.2)

## 2021-02-20 MED ORDER — SODIUM CHLORIDE 0.9 % IV SOLN
2.0000 g | Freq: Two times a day (BID) | INTRAVENOUS | Status: DC
  Administered 2021-02-20 – 2021-03-04 (×24): 2 g via INTRAVENOUS
  Filled 2021-02-20 (×24): qty 2

## 2021-02-20 MED ORDER — IRBESARTAN 150 MG PO TABS
150.0000 mg | ORAL_TABLET | Freq: Every day | ORAL | Status: DC
  Administered 2021-02-21 – 2021-03-02 (×10): 150 mg via ORAL
  Filled 2021-02-20 (×11): qty 1

## 2021-02-20 MED ORDER — SODIUM CHLORIDE 0.9 % IV SOLN: INTRAVENOUS | Status: DC

## 2021-02-20 NOTE — Progress Notes (Signed)
Philip Mcclain  [id number] DOB: [birth year] DOA: 02/19/2021 PCP: Glenda Chroman, MD    Brief Narrative:  87 year old with a history of pulmonary hypertension, persistent atrial fibrillation on Eliquis, HTN, gout, and BPH who was seen as an outpatient by orthopedics and underwent a left knee aspiration and steroid injection June 14.  Specimen that time was consistent with gout but also grew Pseudomonas.  Since the time of his aspiration he has experienced increasing confusion and decreased appetite.  In the [place] he was found to be confused with a left knee effusion, BUN of 70, creatinine 2.16, and WBC 22,000.  Aspiration of his left knee produced frank pus.  Orthopedics was consulted and the patient was admitted with active sepsis.  Significant Events:  6/18 admit via [facility]  Consultants:  Orthopedics  Code Status: NO CODE BLUE  Antimicrobials:  Vancomycin 6/18  Flagyl 6/18  Cefepime 6/18 >  DVT prophylaxis: SCDs (Eliquis on hold for knee surgery)  Subjective: Afebrile.  Blood pressure trending upward.  Saturations 100% on room air. Alert and conversant, but mildly confused. Denies uncontrolled pain.   Assessment & Plan:  Sepsis due to septic left knee (POA) - Pseudomonas aeruginosa septic arthritis Continue empiric antibiotic therapy - Orthopedics following - follow repeat joint fluid cultures - for trip to OR early this week   Acute gout flair L Knee  Continue medical management - avoid steroids in setting of acute infection   Acute hyponatremia Appears to primarily be due to hypovolemia -continue to hydrate and follow  Acute kidney injury Creatinine 2.16 at presentation -improving with resolution of urinary retention and volume expansion  Toxic metabolic encephalopathy Due to above - much improved today - follow trend   HTN BP not controlled - adjust tx and follow trend   Persistent chronic atrial fibrillation on Eliquis Eliquis on hold for pending surgery -  rate controlled - monitor on tele   Pulmonary hypertension Confirmed on last TTE December 2021 - clinically compensated at present   BPH with LUTS Followed by Urology -required I's/O cath insertion last night producing 1400 cc of urine    Family Communication:  Status is: Inpatient  Remains inpatient appropriate because:Inpatient level of care appropriate due to severity of illness  Dispo: The patient is from: Home              Anticipated d/c is to:  unclear              Patient currently is not medically stable to d/c.   Difficult to place patient No    Objective: Blood pressure (!) 176/66, pulse 73, temperature 98.2 F (36.8 C), temperature source Oral, resp. rate 18, height '5\' 11"'$  (1.803 m), weight 79.8 kg, SpO2 100 %.  Intake/Output Summary (Last 24 hours) at 02/20/2021 0825 Last data filed at 02/20/2021 0400 Gross per 24 hour  Intake 3177.06 ml  Output 1400 ml  Net 1777.06 ml   Filed Weights   02/19/21 1248  Weight: 79.8 kg    Examination: General: No acute respiratory distress Lungs: Clear to auscultation bilaterally without wheezes or crackles Cardiovascular: Regular rate and rhythm without murmur gallop or rub normal S1 and S2 Abdomen: Nontender, nondistended, soft, bowel sounds positive, no rebound, no ascites, no appreciable mass Extremities: 2+ edema R LE - 1+ edema L LE   CBC: Recent Labs  Lab 02/19/21 1318 02/20/21 0618  WBC 22.1* 18.8*  NEUTROABS 20.0* 16.6*  HGB 11.7* 10.5*  HCT 36.0*  32.9*  MCV 96.3 98.2  PLT 493* [id number]*   Basic Metabolic Panel: Recent Labs  Lab 02/19/21 1318 02/20/21 0618  NA 132* 131*  K 4.8 4.5  CL 100 103  CO2 21* 21*  GLUCOSE 116* 123*  BUN 70* 59*  CREATININE 2.16* 1.79*  CALCIUM 9.7 9.1  MG 2.3  --    GFR: Estimated Creatinine Clearance: 32.7 mL/min (A) (by C-G formula based on SCr of 1.79 mg/dL (H)).  Liver Function Tests: Recent Labs  Lab 02/19/21 1318  AST 20  ALT 24  ALKPHOS 108  BILITOT 1.1   PROT 6.8  ALBUMIN 2.9*    Recent Labs  Lab 02/19/21 1318  AMMONIA <9*    Coagulation Profile: Recent Labs  Lab 02/19/21 1318  INR 2.0*    Cardiac Enzymes: Recent Labs  Lab 02/19/21 1318  CKTOTAL 27*    HbA1C: Hgb A1c MFr Bld  Date/Time Value Ref Range Status  08/19/2020 02:01 PM 5.2 4.8 - 5.6 % Final    Comment:    (NOTE) Pre diabetes:          5.7%-6.4%  Diabetes:              >6.4%  Glycemic control for   <7.0% adults with diabetes      Recent Results (from the past 240 hour(s))  Wound culture     Status: Abnormal   Collection Time: 02/15/21  1:58 PM   Specimen: Wound  Result Value Ref Range Status   MICRO NUMBER: [id number]  Final   SPECIMEN QUALITY: Adequate  Final   SOURCE: KNEE, LEFT  Final   STATUS: FINAL  Final   GRAM STAIN:   Final    Many Polymorphonuclear leukocytes No organisms seen   ISOLATE 1: Pseudomonas aeruginosa (A)  Final    Comment: Heavy growth of Pseudomonas aeruginosa      Susceptibility   Pseudomonas aeruginosa - AEROBIC CULT, GRAM STAIN NEGATIVE 1    CEFTAZIDIME 4 Sensitive     CEFEPIME <=1 Sensitive     CIPROFLOXACIN 0.5 Sensitive     LEVOFLOXACIN 0.5 Sensitive     GENTAMICIN 2 Sensitive     IMIPENEM 1 Sensitive     PIP/TAZO <=4 Sensitive     TOBRAMYCIN* <=1 Sensitive      * Legend: S = Susceptible  I = Intermediate R = Resistant  NS = Not susceptible * = Not tested  NR = Not reported **NN = See antimicrobic comments   Resp Panel by RT-PCR (Flu A&B, Covid) Nasopharyngeal Swab     Status: None   Collection Time: 02/19/21  2:29 PM   Specimen: Nasopharyngeal Swab; Nasopharyngeal(NP) swabs in vial transport medium  Result Value Ref Range Status   SARS Coronavirus 2 by RT PCR NEGATIVE NEGATIVE Final    Comment: (NOTE) SARS-CoV-2 target nucleic acids are NOT DETECTED.  The SARS-CoV-2 RNA is generally detectable in upper respiratory specimens during the acute phase of infection. The lowest concentration of SARS-CoV-2  viral copies this assay can detect is 138 copies/mL. A negative result does not preclude SARS-Cov-2 infection and should not be used as the sole basis for treatment or other patient management decisions. A negative result may occur with  improper specimen collection/handling, submission of specimen other than nasopharyngeal swab, presence of viral mutation(s) within the areas targeted by this assay, and inadequate number of viral copies(<138 copies/mL). A negative result must be combined with clinical observations, patient history, and epidemiological information. The expected result is  Negative.  Fact Sheet for Patients:  EntrepreneurPulse.com.au  Fact Sheet for Healthcare Providers:  IncredibleEmployment.be  This test is no t yet approved or cleared by the [place] FDA and  has been authorized for detection and/or diagnosis of SARS-CoV-2 by FDA under an Emergency Use Authorization (EUA). This EUA will remain  in effect (meaning this test can be used) for the duration of the COVID-19 declaration under Section 564(b)(1) of the Act, 21 U.S.C.section 360bbb-3(b)(1), unless the authorization is terminated  or revoked sooner.       Influenza A by PCR NEGATIVE NEGATIVE Final   Influenza B by PCR NEGATIVE NEGATIVE Final    Comment: (NOTE) The Xpert Xpress SARS-CoV-2/FLU/RSV plus assay is intended as an aid in the diagnosis of influenza from Nasopharyngeal swab specimens and should not be used as a sole basis for treatment. Nasal washings and aspirates are unacceptable for Xpert Xpress SARS-CoV-2/FLU/RSV testing.  Fact Sheet for Patients: EntrepreneurPulse.com.au  Fact Sheet for Healthcare Providers: IncredibleEmployment.be  This test is not yet approved or cleared by the [place] FDA and has been authorized for detection and/or diagnosis of SARS-CoV-2 by FDA under an Emergency Use Authorization  (EUA). This EUA will remain in effect (meaning this test can be used) for the duration of the COVID-19 declaration under Section 564(b)(1) of the Act, 21 U.S.C. section 360bbb-3(b)(1), unless the authorization is terminated or revoked.  Performed at [facility], [address], [place], [place] [address]   Blood Culture (routine x 2)     Status: None (Preliminary result)   Collection Time: 02/19/21  2:33 PM   Specimen: BLOOD RIGHT HAND  Result Value Ref Range Status   Specimen Description   Final    BLOOD RIGHT HAND BOTTLES DRAWN AEROBIC AND ANAEROBIC   Special Requests   Final    Blood Culture results may not be optimal due to an inadequate volume of blood received in culture bottles Performed at [facility], [address] [place], [place] [address]    Culture PENDING  Incomplete   Report Status PENDING  Incomplete  Blood Culture (routine x 2)     Status: None (Preliminary result)   Collection Time: 02/19/21  2:33 PM   Specimen: BLOOD LEFT ARM  Result Value Ref Range Status   Specimen Description BLOOD LEFT ARM BOTTLES DRAWN AEROBIC AND ANAEROBIC  Final   Special Requests   Final    Blood Culture results may not be optimal due to an inadequate volume of blood received in culture bottles Performed at [facility], [address], [place], [place] [address]    Culture PENDING  Incomplete   Report Status PENDING  Incomplete  Body fluid culture w Gram Stain     Status: None (Preliminary result)   Collection Time: 02/19/21  3:36 PM   Specimen: KNEE; Body Fluid  Result Value Ref Range Status   Specimen Description KNEE JOINT FLUID  Final   Special Requests Normal  Final   Gram Stain   Final    NO ORGANISMS SEEN WBC PRESENT, PREDOMINANTLY PMN Performed at [facility], [address] [place], [place] [address]    Culture PENDING  Incomplete   Report Status PENDING  Incomplete     Scheduled Meds:  acetaminophen  650 mg Oral Q6H   Or   acetaminophen  650 mg Rectal Q6H    allopurinol  100 mg Oral Daily   apixaban  2.5 mg Oral BID   atorvastatin  40 mg Oral Daily  finasteride  5 mg Oral Daily   furosemide  40 mg Oral Daily   irbesartan  150 mg Oral Daily   metoprolol succinate  50 mg Oral BID   pantoprazole  40 mg Oral QPM   tamsulosin  0.4 mg Oral BID   Continuous Infusions:  ceFEPime (MAXIPIME) IV     lactated ringers 150 mL/hr at 02/19/21 2038   vancomycin       LOS: 1 day   Cherene Altes, MD [facility] Office  [phone] Pager - Text Page per Shea Evans  If 7PM-7AM, please contact night-coverage per Amion 02/20/2021, 8:25 AM

## 2021-02-20 NOTE — Progress Notes (Signed)
Pharmacy Antibiotic Note  Philip Mcclain a 87 y.o. male admitted on 02/20/2021 with sepsis.  Pharmacy has been consulted for vancomycin and cefepime dosing.  Plan: Vancomycin '1000mg'$  IV every 24 hours.  Goal trough 15-20 mcg/mL. Cefepime 2gm IV every 12 hours.  Medical History: Past Medical History:  Diagnosis Date   Acid reflux    Arthritis    Gout    Heart murmur    Hypertension    Kidney stones    Ruptured disc, cervical     Allergies:  No Known Allergies  Filed Weights   02/19/21 1248  Weight: 79.8 kg (176 lb)    CBC Latest Ref Rng & Units 02/20/2021 02/19/2021 08/21/2020  WBC 4.0 - 10.5 K/uL 18.8(H) 22.1(H) 5.0  Hemoglobin 13.0 - 17.0 g/dL 10.5(L) 11.7(L) 10.7(L)  Hematocrit 39.0 - 52.0 % 32.9(L) 36.0(L) 29.9(L)  Platelets 150 - 400 K/uL 456(H) 493(H) 106(L)     Estimated Creatinine Clearance: 32.7 mL/min (A) (by C-G formula based on SCr of 1.79 mg/dL (H)).  Antibiotics Given (last 72 hours)     Date/Time Action Medication Dose Rate   02/19/21 1437 New Bag/Given   ceFEPIme (MAXIPIME) 2 g in sodium chloride 0.9 % 100 mL IVPB 2 g 200 mL/hr   02/19/21 1511 New Bag/Given   metroNIDAZOLE (FLAGYL) IVPB 500 mg 500 mg 100 mL/hr   02/19/21 1646 New Bag/Given   vancomycin (VANCOREADY) IVPB 1500 mg/300 mL 1,500 mg 150 mL/hr   02/20/21 1336 New Bag/Given   ceFEPIme (MAXIPIME) 2 g in sodium chloride 0.9 % 100 mL IVPB 2 g 200 mL/hr       Antimicrobials this admission: Cefepime 02/19/2021  >>  vancomycin 02/19/2021  >>  Metronidazole 02/19/2021   x 1   Microbiology results: 02/19/2021  BCx: sent 02/19/2021  UCx: sent 02/19/2021  Resp Panel: sent    Thank you for allowing pharmacy to be a part of this patient's care.  Thomasenia Sales, PharmD Clinical Pharmacist

## 2021-02-20 NOTE — Progress Notes (Signed)
In and out catheter inserted with Jerene Pitch, RN.  Patient tolerated procedure well.  1400 mls of dark yellow urine with sediment returned.  Small blood clots noted at the very beginning and very end of urinary return.

## 2021-02-20 NOTE — Progress Notes (Signed)
Patient found to have reddened area to left lower extremity. The calf area appears red also with some hardened area. O/C provider notified Via Amion.(Dr Hanley Ben) I will continue to monitor patient as the shift ensues.

## 2021-02-20 NOTE — Consult Note (Signed)
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 what is chronic inflammatory arthritis picture from the gout  Plan going forward  Recommend continue IV antibiotics to cover for infectious process including knee joint  Stop Eliquis for Sunday Monday plan for arthroscopic washout on Tuesday  Also monitor and continue to replace fluids as his BUN/creatinine elevation will contribute to uric acid retention  Culture of the fluid from yesterday important to see if Pseudomonas still an issue as a infecting organism or was that a contaminant  Serial aspirations can be performed while he is coming down from the Eliquis.  [name] 02/20/2021, 8:33 AM

## 2021-02-21 LAB — CBC
HCT: 27.5 % — ABNORMAL LOW (ref 39.0–52.0)
Hemoglobin: 8.9 g/dL — ABNORMAL LOW (ref 13.0–17.0)
MCH: 31.9 pg (ref 26.0–34.0)
MCHC: 32.4 g/dL (ref 30.0–36.0)
MCV: 98.6 fL (ref 80.0–100.0)
Platelets: 381 10*3/uL (ref 150–400)
RBC: 2.79 MIL/uL — ABNORMAL LOW (ref 4.22–5.81)
RDW: 14.5 % (ref 11.5–15.5)
WBC: 16.5 10*3/uL — ABNORMAL HIGH (ref 4.0–10.5)
nRBC: 0 % (ref 0.0–0.2)

## 2021-02-21 LAB — BASIC METABOLIC PANEL
Anion gap: 6 (ref 5–15)
BUN: 48 mg/dL — ABNORMAL HIGH (ref 8–23)
CO2: 21 mmol/L — ABNORMAL LOW (ref 22–32)
Calcium: 9.2 mg/dL (ref 8.9–10.3)
Chloride: 106 mmol/L (ref 98–111)
Creatinine, Ser: 1.49 mg/dL — ABNORMAL HIGH (ref 0.61–1.24)
GFR, Estimated: 46 mL/min — ABNORMAL LOW (ref >60)
Glucose, Bld: 106 mg/dL — ABNORMAL HIGH (ref 70–99)
Potassium: 4.4 mmol/L (ref 3.5–5.1)
Sodium: 133 mmol/L — ABNORMAL LOW (ref 135–145)

## 2021-02-21 LAB — URINE CULTURE: Culture: NO GROWTH

## 2021-02-21 LAB — MAGNESIUM: Magnesium: 2 mg/dL (ref 1.7–2.4)

## 2021-02-21 NOTE — Progress Notes (Signed)
Philip Mcclain  [id number] DOB: 12-30-1933 DOA: 02/19/2021 PCP: Glenda Chroman, MD    Brief Narrative:  87 year old with a history of pulmonary hypertension, persistent atrial fibrillation on Eliquis, HTN, gout, and BPH who was seen as an outpatient by orthopedics and underwent a left knee aspiration and steroid injection June 14.  Specimen that time was consistent with gout but also grew Pseudomonas.  Since the time of his aspiration he has experienced increasing confusion and decreased appetite.  In the [place] he was found to be confused with a left knee effusion, BUN of 70, creatinine 2.16, and WBC 22,000.  Aspiration of his left knee produced frank pus.  Orthopedics was consulted and the patient was admitted with active sepsis.  Significant Events:  6/14 outpatient left knee aspiration and injection - Pseudomonas on culture 6/18 admit via [facility]  Consultants:  Orthopedics  Code Status: NO CODE BLUE  Antimicrobials:  Vancomycin 6/18  Flagyl 6/18  Cefepime 6/18 >  DVT prophylaxis: SCDs (Eliquis on hold for knee surgery)  Subjective: Afebrile.  Vital signs stable.  WBC trending downward.  Is alert and conversant but very hard of hearing and appears to be slightly altered.  Is calm and pleasant.  Assessment & Plan:  Sepsis due to septic left knee (POA) - Pseudomonas aeruginosa septic arthritis Continue empiric antibiotic therapy - Orthopedics following - repeat joint fluid cultures again growing Pseudomonas - to OR this week   Acute gout flair L Knee  Continue medical management - avoid steroids in setting of acute infection   Acute hyponatremia Appears to primarily be due to hypovolemia -improving with volume resuscitation  Acute kidney injury Creatinine 2.16 at presentation -improving with resolution of urinary retention and volume expansion  Toxic metabolic encephalopathy Due to above -check 123456 and folic acid for sake of completeness  HTN Blood pressure well  controlled  Persistent chronic atrial fibrillation on Eliquis Eliquis on hold for pending surgery - rate controlled - monitor on tele   Pulmonary hypertension Confirmed on last TTE December 2021 - clinically compensated at present   BPH with LUTS Followed by Urology -required I/O cath insertion 6/18 night producing 1400 cc of urine -no apparent recurrence thus far    Family Communication:  Status is: Inpatient  Remains inpatient appropriate because:Inpatient level of care appropriate due to severity of illness  Dispo: The patient is from: Home              Anticipated d/c is to:  unclear              Patient currently is not medically stable to d/c.   Difficult to place patient No    Objective: Blood pressure (!) 150/74, pulse 66, temperature 97.8 F (36.6 C), temperature source Oral, resp. rate 18, height '5\' 11"'$  (1.803 m), weight 79.8 kg, SpO2 98 %.  Intake/Output Summary (Last 24 hours) at 02/21/2021 1359 Last data filed at 02/21/2021 1100 Gross per 24 hour  Intake 1157.52 ml  Output 1450 ml  Net -292.48 ml    Filed Weights   02/19/21 1248  Weight: 79.8 kg    Examination: General: No acute respiratory distress Lungs: Clear to auscultation bilaterally without wheezes Cardiovascular: Regular rate without murmur or rub Abdomen: NT/ND, soft, BS positive, no rebound Extremities: 2+ dependent edema R LE - 1+ edema L LE   CBC: Recent Labs  Lab 02/19/21 1318 02/20/21 0618 02/21/21 0557  WBC 22.1* 18.8* 16.5*  NEUTROABS 20.0* 16.6*  --  HGB 11.7* 10.5* 8.9*  HCT 36.0* 32.9* 27.5*  MCV 96.3 98.2 98.6  PLT 493* 456* [id number]    Basic Metabolic Panel: Recent Labs  Lab 02/19/21 1318 02/20/21 0618 02/21/21 0557  NA 132* 131* 133*  K 4.8 4.5 4.4  CL 100 103 106  CO2 21* 21* 21*  GLUCOSE 116* 123* 106*  BUN 70* 59* 48*  CREATININE 2.16* 1.79* 1.49*  CALCIUM 9.7 9.1 9.2  MG 2.3  --  2.0    GFR: Estimated Creatinine Clearance: 39.3 mL/min (A) (by C-G formula  based on SCr of 1.49 mg/dL (H)).  Liver Function Tests: Recent Labs  Lab 02/19/21 1318  AST 20  ALT 24  ALKPHOS 108  BILITOT 1.1  PROT 6.8  ALBUMIN 2.9*     Recent Labs  Lab 02/19/21 1318  AMMONIA <9*     Coagulation Profile: Recent Labs  Lab 02/19/21 1318  INR 2.0*     Cardiac Enzymes: Recent Labs  Lab 02/19/21 1318  CKTOTAL 27*     HbA1C: Hgb A1c MFr Bld  Date/Time Value Ref Range Status  08/19/2020 02:01 PM 5.2 4.8 - 5.6 % Final    Comment:    (NOTE) Pre diabetes:          5.7%-6.4%  Diabetes:              >6.4%  Glycemic control for   <7.0% adults with diabetes      Recent Results (from the past 240 hour(s))  Wound culture     Status: Abnormal   Collection Time: 02/15/21  1:58 PM   Specimen: Wound  Result Value Ref Range Status   MICRO NUMBER: [id number]  Final   SPECIMEN QUALITY: Adequate  Final   SOURCE: KNEE, LEFT  Final   STATUS: FINAL  Final   GRAM STAIN:   Final    Many Polymorphonuclear leukocytes No organisms seen   ISOLATE 1: Pseudomonas aeruginosa (A)  Final    Comment: Heavy growth of Pseudomonas aeruginosa      Susceptibility   Pseudomonas aeruginosa - AEROBIC CULT, GRAM STAIN NEGATIVE 1    CEFTAZIDIME 4 Sensitive     CEFEPIME <=1 Sensitive     CIPROFLOXACIN 0.5 Sensitive     LEVOFLOXACIN 0.5 Sensitive     GENTAMICIN 2 Sensitive     IMIPENEM 1 Sensitive     PIP/TAZO <=4 Sensitive     TOBRAMYCIN* <=1 Sensitive      * Legend: S = Susceptible  I = Intermediate R = Resistant  NS = Not susceptible * = Not tested  NR = Not reported **NN = See antimicrobic comments   Urine Culture     Status: None   Collection Time: 02/19/21  1:00 PM   Specimen: Urine, Catheterized  Result Value Ref Range Status   Specimen Description   Final    URINE, CATHETERIZED Performed at [facility], [address] [place], [place] [address]    Special Requests   Final    NONE Performed at [facility], [address], [place], [place]  [address]    Culture   Final    NO GROWTH Performed at [facility], [address] [place], [place] [address]    Report Status 02/21/2021 FINAL  Final  Resp Panel by RT-PCR (Flu A&B, Covid) Nasopharyngeal Swab     Status: None   Collection Time: 02/19/21  2:29 PM   Specimen: Nasopharyngeal Swab; Nasopharyngeal(NP) swabs in vial transport medium  Result  Value Ref Range Status   SARS Coronavirus 2 by RT PCR NEGATIVE NEGATIVE Final    Comment: (NOTE) SARS-CoV-2 target nucleic acids are NOT DETECTED.  The SARS-CoV-2 RNA is generally detectable in upper respiratory specimens during the acute phase of infection. The lowest concentration of SARS-CoV-2 viral copies this assay can detect is 138 copies/mL. A negative result does not preclude SARS-Cov-2 infection and should not be used as the sole basis for treatment or other patient management decisions. A negative result may occur with  improper specimen collection/handling, submission of specimen other than nasopharyngeal swab, presence of viral mutation(s) within the areas targeted by this assay, and inadequate number of viral copies(<138 copies/mL). A negative result must be combined with clinical observations, patient history, and epidemiological information. The expected result is Negative.  Fact Sheet for Patients:  EntrepreneurPulse.com.au  Fact Sheet for Healthcare Providers:  IncredibleEmployment.be  This test is no t yet approved or cleared by the [place] FDA and  has been authorized for detection and/or diagnosis of SARS-CoV-2 by FDA under an Emergency Use Authorization (EUA). This EUA will remain  in effect (meaning this test can be used) for the duration of the COVID-19 declaration under Section 564(b)(1) of the Act, 21 U.S.C.section 360bbb-3(b)(1), unless the authorization is terminated  or revoked sooner.       Influenza A by PCR NEGATIVE NEGATIVE Final   Influenza B by  PCR NEGATIVE NEGATIVE Final    Comment: (NOTE) The Xpert Xpress SARS-CoV-2/FLU/RSV plus assay is intended as an aid in the diagnosis of influenza from Nasopharyngeal swab specimens and should not be used as a sole basis for treatment. Nasal washings and aspirates are unacceptable for Xpert Xpress SARS-CoV-2/FLU/RSV testing.  Fact Sheet for Patients: EntrepreneurPulse.com.au  Fact Sheet for Healthcare Providers: IncredibleEmployment.be  This test is not yet approved or cleared by the [place] FDA and has been authorized for detection and/or diagnosis of SARS-CoV-2 by FDA under an Emergency Use Authorization (EUA). This EUA will remain in effect (meaning this test can be used) for the duration of the COVID-19 declaration under Section 564(b)(1) of the Act, 21 U.S.C. section 360bbb-3(b)(1), unless the authorization is terminated or revoked.  Performed at [facility], [address] [place], [place] [address]   Blood Culture (routine x 2)     Status: None (Preliminary result)   Collection Time: 02/19/21  2:33 PM   Specimen: BLOOD RIGHT HAND  Result Value Ref Range Status   Specimen Description   Final    BLOOD RIGHT HAND BOTTLES DRAWN AEROBIC AND ANAEROBIC   Special Requests   Final    Blood Culture results may not be optimal due to an inadequate volume of blood received in culture bottles   Culture   Final    NO GROWTH 2 DAYS Performed at [facility], [address], [place], [place] [address]    Report Status PENDING  Incomplete  Blood Culture (routine x 2)     Status: None (Preliminary result)   Collection Time: 02/19/21  2:33 PM   Specimen: BLOOD LEFT ARM  Result Value Ref Range Status   Specimen Description BLOOD LEFT ARM BOTTLES DRAWN AEROBIC AND ANAEROBIC  Final   Special Requests   Final    Blood Culture results may not be optimal due to an inadequate volume of blood received in culture bottles   Culture   Final    NO GROWTH 2  DAYS Performed at [facility], [address] [place], [place] [address]  Report Status PENDING  Incomplete  Body fluid culture w Gram Stain     Status: None (Preliminary result)   Collection Time: 02/19/21  3:36 PM   Specimen: KNEE; Body Fluid  Result Value Ref Range Status   Specimen Description   Final    KNEE JOINT FLUID Performed at [facility], [address] [place], [place] [address]    Special Requests   Final    Normal Performed at [facility], [address], [place], [place] [address]    Gram Stain   Final    NO ORGANISMS SEEN WBC PRESENT, PREDOMINANTLY PMN Performed at [facility], [address] [place], [place] [address]    Culture   Final    FEW PSEUDOMONAS AERUGINOSA CRITICAL RESULT CALLED TO, READ BACK BY AND VERIFIED WITH: RN E.WRIGHT ON [id number] AT [id number] BY E.PARRISH SUSCEPTIBILITIES TO FOLLOW Performed at [facility], [place] [address] [place], [place] [address]    Report Status PENDING  Incomplete     Scheduled Meds:  acetaminophen  650 mg Oral Q6H   allopurinol  100 mg Oral Daily   finasteride  5 mg Oral Daily   irbesartan  150 mg Oral Daily   metoprolol succinate  50 mg Oral BID   pantoprazole  40 mg Oral QPM   tamsulosin  0.4 mg Oral BID   Continuous Infusions:  sodium chloride 75 mL/hr at 02/21/21 0809   ceFEPime (MAXIPIME) IV 2 g (02/21/21 0810)     LOS: 2 days   Cherene Altes, MD [facility] Office  [phone] Pager - Text Page per Shea Evans  If 7PM-7AM, please contact night-coverage per Amion 02/21/2021, 1:59 PM

## 2021-02-21 NOTE — Plan of Care (Signed)
  Problem: Education: Goal: Knowledge of General Education information will improve Description Including pain rating scale, medication(s)/side effects and non-pharmacologic comfort measures Outcome: Progressing   Problem: Health Behavior/Discharge Planning: Goal: Ability to manage health-related needs will improve Outcome: Progressing   

## 2021-02-21 NOTE — Plan of Care (Signed)
  Problem: Education: Goal: Knowledge of General Education information will improve Description: Including pain rating scale, medication(s)/side effects and non-pharmacologic comfort measures Outcome: Not Progressing   Problem: Health Behavior/Discharge Planning: Goal: Ability to manage health-related needs will improve Outcome: Not Progressing   Problem: Clinical Measurements: Goal: Ability to maintain clinical measurements within normal limits will improve Outcome: Not Progressing Goal: Will remain free from infection Outcome: Not Progressing Goal: Diagnostic test results will improve Outcome: Not Progressing Goal: Respiratory complications will improve Outcome: Progressing Goal: Cardiovascular complication will be avoided Outcome: Progressing   Problem: Activity: Goal: Risk for activity intolerance will decrease Outcome: Not Progressing   Problem: Nutrition: Goal: Adequate nutrition will be maintained Outcome: Progressing   Problem: Coping: Goal: Level of anxiety will decrease Outcome: Progressing   Problem: Elimination: Goal: Will not experience complications related to bowel motility Outcome: Progressing Goal: Will not experience complications related to urinary retention Outcome: Progressing   Problem: Pain Managment: Goal: General experience of comfort will improve Outcome: Progressing   Problem: Safety: Goal: Ability to remain free from injury will improve Outcome: Progressing   Problem: Skin Integrity: Goal: Risk for impaired skin integrity will decrease Outcome: Progressing

## 2021-02-22 ENCOUNTER — Telehealth: Payer: Self-pay

## 2021-02-22 ENCOUNTER — Ambulatory Visit: Payer: Medicare Other | Admitting: Orthopaedic Surgery

## 2021-02-22 LAB — CBC
HCT: 29.3 % — ABNORMAL LOW (ref 39.0–52.0)
Hemoglobin: 9.2 g/dL — ABNORMAL LOW (ref 13.0–17.0)
MCH: 31.2 pg (ref 26.0–34.0)
MCHC: 31.4 g/dL (ref 30.0–36.0)
MCV: 99.3 fL (ref 80.0–100.0)
Platelets: 379 10*3/uL (ref 150–400)
RBC: 2.95 MIL/uL — ABNORMAL LOW (ref 4.22–5.81)
RDW: 14.5 % (ref 11.5–15.5)
WBC: 15 10*3/uL — ABNORMAL HIGH (ref 4.0–10.5)
nRBC: 0 % (ref 0.0–0.2)

## 2021-02-22 LAB — BASIC METABOLIC PANEL
Anion gap: 7 (ref 5–15)
BUN: 47 mg/dL — ABNORMAL HIGH (ref 8–23)
CO2: 19 mmol/L — ABNORMAL LOW (ref 22–32)
Calcium: 9.4 mg/dL (ref 8.9–10.3)
Chloride: 107 mmol/L (ref 98–111)
Creatinine, Ser: 1.54 mg/dL — ABNORMAL HIGH (ref 0.61–1.24)
GFR, Estimated: 44 mL/min — ABNORMAL LOW (ref >60)
Glucose, Bld: 98 mg/dL (ref 70–99)
Potassium: 4.4 mmol/L (ref 3.5–5.1)
Sodium: 133 mmol/L — ABNORMAL LOW (ref 135–145)

## 2021-02-22 LAB — VITAMIN B12: Vitamin B-12: 1453 pg/mL — ABNORMAL HIGH (ref 180–914)

## 2021-02-22 LAB — FOLATE: Folate: 14.3 ng/mL (ref >5.9)

## 2021-02-22 MED ORDER — CHLORHEXIDINE GLUCONATE 4 % EX LIQD
60.0000 mL | Freq: Once | CUTANEOUS | Status: AC
  Administered 2021-02-23: 4 via TOPICAL
  Filled 2021-02-22: qty 15

## 2021-02-22 MED ORDER — CIPROFLOXACIN HCL 500 MG PO TABS: 500.0000 mg | ORAL_TABLET | Freq: Two times a day (BID) | ORAL | 1 refills | Status: DC

## 2021-02-22 MED ORDER — POVIDONE-IODINE 10 % EX SWAB: 2.0000 "application " | Freq: Once | CUTANEOUS | Status: DC

## 2021-02-22 NOTE — Progress Notes (Signed)
Philip Mcclain  [id number] DOB: 30-Jul-1934 DOA: 02/19/2021 PCP: Glenda Chroman, MD    Brief Narrative:  87 year old with a history of pulmonary hypertension, persistent atrial fibrillation on Eliquis, HTN, gout, and BPH who was seen as an outpatient by orthopedics and underwent a left knee aspiration and steroid injection June 14.  Specimen that time was consistent with gout but also grew Pseudomonas.  Since the time of his aspiration he has experienced increasing confusion and decreased appetite.  In the [place] he was found to be confused with a left knee effusion, BUN of 70, creatinine 2.16, and WBC 22,000.  Aspiration of his left knee produced frank pus.  Orthopedics was consulted and the patient was admitted with active sepsis.  Significant Events:  6/14 outpatient left knee aspiration and injection - Pseudomonas on culture 6/18 admit via [facility]  Consultants:  Orthopedics  Code Status: NO CODE BLUE  Antimicrobials:  Vancomycin 6/18  Flagyl 6/18  Cefepime 6/18 >  DVT prophylaxis: SCDs (Eliquis on hold for knee surgery)  Subjective: Afebrile.  Blood pressure elevated at 0000000 systolic.  Saturation 98% on room air.  Is alert but remains confused.  Plan is for the patient to go to the OR tomorrow.  Assessment & Plan:  Sepsis due to septic left knee (POA) - Pseudomonas aeruginosa septic arthritis Continue empiric antibiotic therapy - Orthopedics following - repeat joint fluid cultures again growing Pseudomonas - to OR this week - consider ID consultation post-op for direction on length of tx and agent   Acute gout flair L Knee  Continue medical management - avoid steroids in setting of acute infection   Acute hyponatremia Appears to primarily be due to hypovolemia -improving with volume resuscitation - follow trend   Acute kidney injury Creatinine 2.16 at presentation -improving with resolution of urinary retention and volume expansion - follow trend   Toxic metabolic  encephalopathy Due to above - [id number] and folic acid not low - no signif change in mental status at present - CT head unrevealing at time of admission   HTN Monitor w/o change today   Persistent chronic atrial fibrillation on Eliquis Eliquis on hold for pending surgery - rate controlled - monitor on tele   Pulmonary hypertension Confirmed on last TTE December 2021 - clinically compensated at present   BPH with LUTS Followed by Urology -required I/O cath insertion 6/18 night producing 1400 cc of urine -no apparent recurrence thus far    Family Communication:  Status is: Inpatient  Remains inpatient appropriate because:Inpatient level of care appropriate due to severity of illness  Dispo: The patient is from: Home              Anticipated d/c is to:  unclear              Patient currently is not medically stable to d/c.   Difficult to place patient No    Objective: Blood pressure (!) 162/87, pulse 81, temperature 97.8 F (36.6 C), resp. rate 20, height '5\' 11"'$  (1.803 m), weight 79.8 kg, SpO2 98 %.  Intake/Output Summary (Last 24 hours) at 02/22/2021 1202 Last data filed at 02/21/2021 2234 Gross per 24 hour  Intake 600 ml  Output 1250 ml  Net -650 ml    Filed Weights   02/19/21 1248  Weight: 79.8 kg    Examination: General: No acute respiratory distress - alert and pleasant but confused  Lungs: CTA B - no wheezing  Cardiovascular: Regular rate without  murmur or rub Abdomen: NT/ND, soft, BS positive, no rebound Extremities: 2+ dependent edema R LE - 1+ edema L LE w/o change   CBC: Recent Labs  Lab 02/19/21 1318 02/20/21 0618 02/21/21 0557 02/22/21 0600  WBC 22.1* 18.8* 16.5* 15.0*  NEUTROABS 20.0* 16.6*  --   --   HGB 11.7* 10.5* 8.9* 9.2*  HCT 36.0* 32.9* 27.5* 29.3*  MCV 96.3 98.2 98.6 99.3  PLT 493* 456* 381 [id number]    Basic Metabolic Panel: Recent Labs  Lab 02/19/21 1318 02/20/21 0618 02/21/21 0557 02/22/21 0600  NA 132* 131* 133* 133*  K 4.8 4.5 4.4  4.4  CL 100 103 106 107  CO2 21* 21* 21* 19*  GLUCOSE 116* 123* 106* 98  BUN 70* 59* 48* 47*  CREATININE 2.16* 1.79* 1.49* 1.54*  CALCIUM 9.7 9.1 9.2 9.4  MG 2.3  --  2.0  --     GFR: Estimated Creatinine Clearance: 38 mL/min (A) (by C-G formula based on SCr of 1.54 mg/dL (H)).  Liver Function Tests: Recent Labs  Lab 02/19/21 1318  AST 20  ALT 24  ALKPHOS 108  BILITOT 1.1  PROT 6.8  ALBUMIN 2.9*     Recent Labs  Lab 02/19/21 1318  AMMONIA <9*     Coagulation Profile: Recent Labs  Lab 02/19/21 1318  INR 2.0*     Cardiac Enzymes: Recent Labs  Lab 02/19/21 1318  CKTOTAL 27*     HbA1C: Hgb A1c MFr Bld  Date/Time Value Ref Range Status  08/19/2020 02:01 PM 5.2 4.8 - 5.6 % Final    Comment:    (NOTE) Pre diabetes:          5.7%-6.4%  Diabetes:              >6.4%  Glycemic control for   <7.0% adults with diabetes      Recent Results (from the past 240 hour(s))  Wound culture     Status: Abnormal   Collection Time: 02/15/21  1:58 PM   Specimen: Wound  Result Value Ref Range Status   MICRO NUMBER: [id number]  Final   SPECIMEN QUALITY: Adequate  Final   SOURCE: KNEE, LEFT  Final   STATUS: FINAL  Final   GRAM STAIN:   Final    Many Polymorphonuclear leukocytes No organisms seen   ISOLATE 1: Pseudomonas aeruginosa (A)  Final    Comment: Heavy growth of Pseudomonas aeruginosa      Susceptibility   Pseudomonas aeruginosa - AEROBIC CULT, GRAM STAIN NEGATIVE 1    CEFTAZIDIME 4 Sensitive     CEFEPIME <=1 Sensitive     CIPROFLOXACIN 0.5 Sensitive     LEVOFLOXACIN 0.5 Sensitive     GENTAMICIN 2 Sensitive     IMIPENEM 1 Sensitive     PIP/TAZO <=4 Sensitive     TOBRAMYCIN* <=1 Sensitive      * Legend: S = Susceptible  I = Intermediate R = Resistant  NS = Not susceptible * = Not tested  NR = Not reported **NN = See antimicrobic comments   Urine Culture     Status: None   Collection Time: 02/19/21  1:00 PM   Specimen: Urine, Catheterized  Result  Value Ref Range Status   Specimen Description   Final    URINE, CATHETERIZED Performed at [facility], [address], [place], [place] [address]    Special Requests   Final    NONE Performed at [facility], [address], [place], [place]  [address]    Culture   Final    NO GROWTH Performed at [facility], [place] [address], [place], [place] [address]    Report Status 02/21/2021 FINAL  Final  Resp Panel by RT-PCR (Flu A&B, Covid) Nasopharyngeal Swab     Status: None   Collection Time: 02/19/21  2:29 PM   Specimen: Nasopharyngeal Swab; Nasopharyngeal(NP) swabs in vial transport medium  Result Value Ref Range Status   SARS Coronavirus 2 by RT PCR NEGATIVE NEGATIVE Final    Comment: (NOTE) SARS-CoV-2 target nucleic acids are NOT DETECTED.  The SARS-CoV-2 RNA is generally detectable in upper respiratory specimens during the acute phase of infection. The lowest concentration of SARS-CoV-2 viral copies this assay can detect is 138 copies/mL. A negative result does not preclude SARS-Cov-2 infection and should not be used as the sole basis for treatment or other patient management decisions. A negative result may occur with  improper specimen collection/handling, submission of specimen other than nasopharyngeal swab, presence of viral mutation(s) within the areas targeted by this assay, and inadequate number of viral copies(<138 copies/mL). A negative result must be combined with clinical observations, patient history, and epidemiological information. The expected result is Negative.  Fact Sheet for Patients:  EntrepreneurPulse.com.au  Fact Sheet for Healthcare Providers:  IncredibleEmployment.be  This test is no t yet approved or cleared by the [place] FDA and  has been authorized for detection and/or diagnosis of SARS-CoV-2 by FDA under an Emergency Use Authorization (EUA). This EUA will remain  in effect (meaning this test can be  used) for the duration of the COVID-19 declaration under Section 564(b)(1) of the Act, 21 U.S.C.section 360bbb-3(b)(1), unless the authorization is terminated  or revoked sooner.       Influenza A by PCR NEGATIVE NEGATIVE Final   Influenza B by PCR NEGATIVE NEGATIVE Final    Comment: (NOTE) The Xpert Xpress SARS-CoV-2/FLU/RSV plus assay is intended as an aid in the diagnosis of influenza from Nasopharyngeal swab specimens and should not be used as a sole basis for treatment. Nasal washings and aspirates are unacceptable for Xpert Xpress SARS-CoV-2/FLU/RSV testing.  Fact Sheet for Patients: EntrepreneurPulse.com.au  Fact Sheet for Healthcare Providers: IncredibleEmployment.be  This test is not yet approved or cleared by the [place] FDA and has been authorized for detection and/or diagnosis of SARS-CoV-2 by FDA under an Emergency Use Authorization (EUA). This EUA will remain in effect (meaning this test can be used) for the duration of the COVID-19 declaration under Section 564(b)(1) of the Act, 21 U.S.C. section 360bbb-3(b)(1), unless the authorization is terminated or revoked.  Performed at [facility], [address] [place], [place] [address]   Blood Culture (routine x 2)     Status: None (Preliminary result)   Collection Time: 02/19/21  2:33 PM   Specimen: BLOOD RIGHT HAND  Result Value Ref Range Status   Specimen Description   Final    BLOOD RIGHT HAND BOTTLES DRAWN AEROBIC AND ANAEROBIC   Special Requests   Final    Blood Culture results may not be optimal due to an inadequate volume of blood received in culture bottles   Culture   Final    NO GROWTH 3 DAYS Performed at [facility], [address], [place],  [address]    Report Status PENDING  Incomplete  Blood Culture (routine x 2)     Status: None (Preliminary result)   Collection Time: 02/19/21  2:33 PM   Specimen: BLOOD LEFT ARM  Result Value Ref Range Status    Specimen Description BLOOD LEFT ARM BOTTLES DRAWN AEROBIC AND ANAEROBIC  Final   Special Requests   Final    Blood Culture results may not be optimal due to an inadequate volume of blood received in culture bottles   Culture   Final    NO GROWTH 3 DAYS Performed at [facility], [address], [place], [place] [address]    Report Status PENDING  Incomplete  Body fluid culture w Gram Stain     Status: None (Preliminary result)   Collection Time: 02/19/21  3:36 PM   Specimen: KNEE; Body Fluid  Result Value Ref Range Status   Specimen Description   Final    KNEE JOINT FLUID Performed at [facility], [address], [place], [place] [address]    Special Requests   Final    Normal Performed at [facility], [address], [place], [place] [address]    Gram Stain   Final    NO ORGANISMS SEEN WBC PRESENT, PREDOMINANTLY PMN Performed at [facility], [address], [place], [place] [address]    Culture   Final    FEW PSEUDOMONAS AERUGINOSA CRITICAL RESULT CALLED TO, READ BACK BY AND VERIFIED WITH: RN E.WRIGHT ON [id number] AT [id number] BY E.PARRISH Performed at [facility], [place] [address] [place], [place] [address]    Report Status PENDING  Incomplete   Organism ID, Bacteria PSEUDOMONAS AERUGINOSA  Final      Susceptibility   Pseudomonas aeruginosa - MIC*    CEFTAZIDIME 2 SENSITIVE Sensitive     CIPROFLOXACIN <=0.25 SENSITIVE Sensitive     GENTAMICIN <=1 SENSITIVE Sensitive     IMIPENEM 1 SENSITIVE Sensitive     PIP/TAZO <=4 SENSITIVE Sensitive     CEFEPIME 2 SENSITIVE Sensitive     * FEW PSEUDOMONAS AERUGINOSA     Scheduled Meds:  acetaminophen  650 mg Oral Q6H   allopurinol  100 mg Oral Daily   finasteride  5 mg Oral Daily   irbesartan  150 mg Oral Daily   metoprolol succinate  50 mg Oral BID   pantoprazole  40 mg Oral QPM   tamsulosin  0.4 mg Oral BID   Continuous Infusions:  sodium chloride 60 mL/hr at 02/21/21 2233   ceFEPime (MAXIPIME) IV 2 g (02/22/21 0911)      LOS: 3 days   Cherene Altes, MD [facility] Office  [phone] Pager - Text Page per Shea Evans  If 7PM-7AM, please contact night-coverage per Amion 02/22/2021, 12:02 PM

## 2021-02-22 NOTE — Telephone Encounter (Signed)
Please send antibiotic for patient   PATIENT USES Philip Mcclain

## 2021-02-22 NOTE — Progress Notes (Addendum)
Patient ID: OTHOR VINES, male   DOB: [birth year], 87 y.o.   MRN: [id number] 87 year old male presented with confusion and pain and swelling of his left knee after being treated for gout  Initial culture grew out 1 isolate of Pseudomonas with a cell count of 50,000+ and then a second aspiration only had 2000 white cells but did grow out Pseudomonas again  Based on the data noted below the patient will need arthroscopic lavage of his left knee   Organism ID, Bacteria PSEUDOMONAS AERUGINOSA   Resulting Agency [facility]      Susceptibility   Pseudomonas aeruginosa    MIC    CEFEPIME 2 SENSITIVE  Sensitive    CEFTAZIDIME 2 SENSITIVE  Sensitive    CIPROFLOXACIN <=0.25 SENS... Sensitive    GENTAMICIN <=1 SENSITIVE  Sensitive    IMIPENEM 1 SENSITIVE  Sensitive    PIP/TAZO <=4 SENSITIVE  Sensitive          Positive culture   Needs arthroscopy on Wednesday   Stop all anticoags and prep for surgery

## 2021-02-22 NOTE — Telephone Encounter (Signed)
Patient's daughter returned my call. She was asking about the Cipro that Dr. Luna Glasgow sent to pharmacy. Patient is in hospital now. I explained to her that I got the message this morning about the prescription and that he had gout. She was polite and stated she would follow up on this.

## 2021-02-22 NOTE — Progress Notes (Signed)
Pharmacy Antibiotic Note  Philip Mcclain a 87 y.o. male admitted on 02/22/2021 with sepsis.  Pharmacy has been consulted for cefepime dosing.  Plan: Cefepime 2gm IV every 12 hours.  Medical History: Past Medical History:  Diagnosis Date   Acid reflux    Arthritis    Gout    Heart murmur    Hypertension    Kidney stones    Ruptured disc, cervical     Allergies:  No Known Allergies  Filed Weights   02/19/21 1248  Weight: 79.8 kg (176 lb)    CBC Latest Ref Rng & Units 02/22/2021 02/21/2021 02/20/2021  WBC 4.0 - 10.5 K/uL 15.0(H) 16.5(H) 18.8(H)  Hemoglobin 13.0 - 17.0 g/dL 9.2(L) 8.9(L) 10.5(L)  Hematocrit 39.0 - 52.0 % 29.3(L) 27.5(L) 32.9(L)  Platelets 150 - 400 K/uL 379 381 456(H)     Estimated Creatinine Clearance: 38 mL/min (A) (by C-G formula based on SCr of 1.54 mg/dL (H)).  Antibiotics Given (last 72 hours)     Date/Time Action Medication Dose Rate   02/19/21 1437 New Bag/Given   ceFEPIme (MAXIPIME) 2 g in sodium chloride 0.9 % 100 mL IVPB 2 g 200 mL/hr   02/19/21 1511 New Bag/Given   metroNIDAZOLE (FLAGYL) IVPB 500 mg 500 mg 100 mL/hr   02/19/21 1646 New Bag/Given   vancomycin (VANCOREADY) IVPB 1500 mg/300 mL 1,500 mg 150 mL/hr   02/20/21 1336 New Bag/Given   ceFEPIme (MAXIPIME) 2 g in sodium chloride 0.9 % 100 mL IVPB 2 g 200 mL/hr   02/20/21 2213 New Bag/Given   ceFEPIme (MAXIPIME) 2 g in sodium chloride 0.9 % 100 mL IVPB 2 g 200 mL/hr   02/21/21 0810 New Bag/Given   ceFEPIme (MAXIPIME) 2 g in sodium chloride 0.9 % 100 mL IVPB 2 g 200 mL/hr   02/21/21 2227 New Bag/Given   ceFEPIme (MAXIPIME) 2 g in sodium chloride 0.9 % 100 mL IVPB 2 g 200 mL/hr   02/22/21 0911 New Bag/Given   ceFEPIme (MAXIPIME) 2 g in sodium chloride 0.9 % 100 mL IVPB 2 g 200 mL/hr       Antimicrobials this admission: Cefepime 02/19/2021  >>  vancomycin 02/19/2021  >> 6/19 Metronidazole 02/19/2021   x 1   Microbiology results: 02/19/2021  BCx: ngtd 02/19/2021  UCx: ng 6/18 Knee joint  fluid: pseudomonas    Thank you for allowing pharmacy to be a part of this patient's care.  Ramond Craver, PharmD Clinical Pharmacist

## 2021-02-22 NOTE — H&P (View-Only) (Signed)
Patient ID: Philip Mcclain, male   DOB: Apr 13, 1934, 87 y.o.   MRN: [id number] 87 year old male presented with confusion and pain and swelling of his left knee after being treated for gout  Initial culture grew out 1 isolate of Pseudomonas with a cell count of 50,000+ and then a second aspiration only had 2000 white cells but did grow out Pseudomonas again  Based on the data noted below the patient will need arthroscopic lavage of his left knee   Organism ID, Bacteria PSEUDOMONAS AERUGINOSA   Resulting Agency [facility]      Susceptibility   Pseudomonas aeruginosa    MIC    CEFEPIME 2 SENSITIVE  Sensitive    CEFTAZIDIME 2 SENSITIVE  Sensitive    CIPROFLOXACIN <=0.25 SENS... Sensitive    GENTAMICIN <=1 SENSITIVE  Sensitive    IMIPENEM 1 SENSITIVE  Sensitive    PIP/TAZO <=4 SENSITIVE  Sensitive          Positive culture   Needs arthroscopy on Wednesday   Stop all anticoags and prep for surgery

## 2021-02-23 ENCOUNTER — Inpatient Hospital Stay (HOSPITAL_COMMUNITY): Payer: Medicare Other | Admitting: Anesthesiology

## 2021-02-23 ENCOUNTER — Encounter (HOSPITAL_COMMUNITY)
Admission: EM | Disposition: A | Discharge status: Nursing Home (Includes ICF) | Payer: Self-pay | Source: Home / Self Care | Attending: Internal Medicine

## 2021-02-23 ENCOUNTER — Encounter (HOSPITAL_COMMUNITY): Payer: Self-pay | Admitting: Internal Medicine

## 2021-02-23 DIAGNOSIS — M00862 Arthritis due to other bacteria, left knee: Secondary | ICD-10-CM

## 2021-02-23 DIAGNOSIS — I10 Essential (primary) hypertension: Secondary | ICD-10-CM

## 2021-02-23 DIAGNOSIS — A4152 Sepsis due to Pseudomonas: Principal | ICD-10-CM

## 2021-02-23 DIAGNOSIS — R6520 Severe sepsis without septic shock: Secondary | ICD-10-CM

## 2021-02-23 HISTORY — PX: KNEE ARTHROSCOPY: SHX127

## 2021-02-23 LAB — BODY FLUID CULTURE W GRAM STAIN
Gram Stain: NONE SEEN
Special Requests: NORMAL

## 2021-02-23 LAB — CBC
HCT: 32.8 % — ABNORMAL LOW (ref 39.0–52.0)
Hemoglobin: 10.3 g/dL — ABNORMAL LOW (ref 13.0–17.0)
MCH: 31.2 pg (ref 26.0–34.0)
MCHC: 31.4 g/dL (ref 30.0–36.0)
MCV: 99.4 fL (ref 80.0–100.0)
Platelets: 362 10*3/uL (ref 150–400)
RBC: 3.3 MIL/uL — ABNORMAL LOW (ref 4.22–5.81)
RDW: 14.5 % (ref 11.5–15.5)
WBC: 16.7 10*3/uL — ABNORMAL HIGH (ref 4.0–10.5)
nRBC: 0 % (ref 0.0–0.2)

## 2021-02-23 LAB — SURGICAL PCR SCREEN
MRSA, PCR: NEGATIVE
Staphylococcus aureus: NEGATIVE

## 2021-02-23 LAB — CREATININE, SERUM
Creatinine, Ser: 1.45 mg/dL — ABNORMAL HIGH (ref 0.61–1.24)
GFR, Estimated: 48 mL/min — ABNORMAL LOW (ref >60)

## 2021-02-23 SURGERY — ARTHROSCOPY, KNEE
Anesthesia: General | Site: Knee | Laterality: Left

## 2021-02-23 MED ORDER — PHENYLEPHRINE 40 MCG/ML (10ML) SYRINGE FOR IV PUSH (FOR BLOOD PRESSURE SUPPORT)
PREFILLED_SYRINGE | INTRAVENOUS | Status: AC
  Filled 2021-02-23: qty 10

## 2021-02-23 MED ORDER — BUPIVACAINE-EPINEPHRINE (PF) 0.5% -1:200000 IJ SOLN
INTRAMUSCULAR | Status: DC | PRN
  Administered 2021-02-23 (×2): 15 mL via PERINEURAL

## 2021-02-23 MED ORDER — HYDROMORPHONE HCL 1 MG/ML IJ SOLN
0.2500 mg | INTRAMUSCULAR | Status: DC | PRN
  Administered 2021-02-23: 0.25 mg via INTRAVENOUS
  Filled 2021-02-23: qty 0.5

## 2021-02-23 MED ORDER — FENTANYL CITRATE (PF) 250 MCG/5ML IJ SOLN
INTRAMUSCULAR | Status: AC
  Filled 2021-02-23: qty 5

## 2021-02-23 MED ORDER — BUPIVACAINE-EPINEPHRINE (PF) 0.5% -1:200000 IJ SOLN
INTRAMUSCULAR | Status: AC
  Filled 2021-02-23: qty 60

## 2021-02-23 MED ORDER — EPHEDRINE 5 MG/ML INJ
INTRAVENOUS | Status: AC
  Filled 2021-02-23: qty 10

## 2021-02-23 MED ORDER — EPINEPHRINE PF 1 MG/ML IJ SOLN
INTRAMUSCULAR | Status: AC
  Filled 2021-02-23: qty 5

## 2021-02-23 MED ORDER — PROPOFOL 10 MG/ML IV BOLUS
INTRAVENOUS | Status: AC
  Filled 2021-02-23: qty 20

## 2021-02-23 MED ORDER — LIDOCAINE HCL (PF) 2 % IJ SOLN
INTRAMUSCULAR | Status: AC
  Filled 2021-02-23: qty 5

## 2021-02-23 MED ORDER — CHLORHEXIDINE GLUCONATE 0.12 % MT SOLN
15.0000 mL | Freq: Once | OROMUCOSAL | Status: AC
  Administered 2021-02-23: 15 mL via OROMUCOSAL

## 2021-02-23 MED ORDER — ONDANSETRON HCL 4 MG/2ML IJ SOLN
INTRAMUSCULAR | Status: DC | PRN
  Administered 2021-02-23: 4 mg via INTRAVENOUS

## 2021-02-23 MED ORDER — EPINEPHRINE PF 1 MG/ML IJ SOLN
INTRAMUSCULAR | Status: AC
  Filled 2021-02-23: qty 2

## 2021-02-23 MED ORDER — LACTATED RINGERS IV SOLN: INTRAVENOUS | Status: DC

## 2021-02-23 MED ORDER — PROPOFOL 10 MG/ML IV BOLUS
INTRAVENOUS | Status: DC | PRN
  Administered 2021-02-23: 100 mg via INTRAVENOUS

## 2021-02-23 MED ORDER — SODIUM CHLORIDE 0.9 % IV SOLN: INTRAVENOUS | Status: DC

## 2021-02-23 MED ORDER — ONDANSETRON HCL 4 MG/2ML IJ SOLN
INTRAMUSCULAR | Status: AC
  Filled 2021-02-23: qty 2

## 2021-02-23 MED ORDER — DOCUSATE SODIUM 100 MG PO CAPS
100.0000 mg | ORAL_CAPSULE | Freq: Two times a day (BID) | ORAL | Status: DC
  Administered 2021-02-23 – 2021-03-08 (×25): 100 mg via ORAL
  Filled 2021-02-23 (×26): qty 1

## 2021-02-23 MED ORDER — ONDANSETRON HCL 4 MG/2ML IJ SOLN: 4.0000 mg | Freq: Once | INTRAMUSCULAR | Status: DC | PRN

## 2021-02-23 MED ORDER — ENOXAPARIN SODIUM 30 MG/0.3ML IJ SOSY
30.0000 mg | PREFILLED_SYRINGE | INTRAMUSCULAR | Status: DC
  Administered 2021-02-24: 30 mg via SUBCUTANEOUS
  Filled 2021-02-23: qty 0.3

## 2021-02-23 MED ORDER — SODIUM CHLORIDE 0.9 % IR SOLN
Status: DC | PRN
  Administered 2021-02-23 (×4): 3000 mL

## 2021-02-23 MED ORDER — ORAL CARE MOUTH RINSE: 15.0000 mL | Freq: Once | OROMUCOSAL | Status: AC

## 2021-02-23 MED ORDER — MUPIROCIN 2 % EX OINT
1.0000 "application " | TOPICAL_OINTMENT | Freq: Two times a day (BID) | CUTANEOUS | Status: DC
  Administered 2021-02-23: 1 via NASAL
  Filled 2021-02-23: qty 22

## 2021-02-23 MED ORDER — ONDANSETRON HCL 4 MG/2ML IJ SOLN: 4.0000 mg | Freq: Four times a day (QID) | INTRAMUSCULAR | Status: DC | PRN

## 2021-02-23 MED ORDER — FENTANYL CITRATE (PF) 100 MCG/2ML IJ SOLN
INTRAMUSCULAR | Status: DC | PRN
  Administered 2021-02-23 (×4): 25 ug via INTRAVENOUS
  Administered 2021-02-23 (×3): 50 ug via INTRAVENOUS

## 2021-02-23 MED ORDER — SODIUM CHLORIDE 0.9 % IR SOLN
Status: DC | PRN
  Administered 2021-02-23: 1000 mL

## 2021-02-23 MED ORDER — ONDANSETRON HCL 4 MG PO TABS: 4.0000 mg | ORAL_TABLET | Freq: Four times a day (QID) | ORAL | Status: DC | PRN

## 2021-02-23 MED ORDER — EPHEDRINE SULFATE 50 MG/ML IJ SOLN
INTRAMUSCULAR | Status: DC | PRN
  Administered 2021-02-23: 5 mg via INTRAVENOUS

## 2021-02-23 MED ORDER — PHENYLEPHRINE HCL (PRESSORS) 10 MG/ML IV SOLN
INTRAVENOUS | Status: DC | PRN
  Administered 2021-02-23 (×3): 80 ug via INTRAVENOUS

## 2021-02-23 MED ORDER — METOCLOPRAMIDE HCL 5 MG/ML IJ SOLN: 5.0000 mg | Freq: Three times a day (TID) | INTRAMUSCULAR | Status: DC | PRN

## 2021-02-23 MED ORDER — METOCLOPRAMIDE HCL 10 MG PO TABS: 5.0000 mg | ORAL_TABLET | Freq: Three times a day (TID) | ORAL | Status: DC | PRN

## 2021-02-23 SURGICAL SUPPLY — 52 items
ABLATOR ASPIRATE 50D MULTI-PRT (SURGICAL WAND) IMPLANT
APL PRP STRL LF DISP 70% ISPRP (MISCELLANEOUS) ×1
BLADE SURG SZ11 CARB STEEL (BLADE) ×2 IMPLANT
BNDG CMPR STD VLCR NS LF 5.8X6 (GAUZE/BANDAGES/DRESSINGS) ×1
BNDG ELASTIC 6X5.8 VLCR NS LF (GAUZE/BANDAGES/DRESSINGS) ×2 IMPLANT
BNDG ELASTIC 6X5.8 VLCR STR LF (GAUZE/BANDAGES/DRESSINGS) ×1 IMPLANT
CHLORAPREP W/TINT 26 (MISCELLANEOUS) ×2 IMPLANT
CLOTH BEACON ORANGE TIMEOUT ST (SAFETY) ×2 IMPLANT
COOLER ICEMAN CLASSIC (MISCELLANEOUS) ×1 IMPLANT
COVER WAND RF STERILE (DRAPES) ×2 IMPLANT
CUFF TOURN SGL QUICK 34 (TOURNIQUET CUFF) ×2
CUFF TRNQT CYL 34X4.125X (TOURNIQUET CUFF) ×2 IMPLANT
DECANTER SPIKE VIAL GLASS SM (MISCELLANEOUS) ×4 IMPLANT
DISSECTOR 4.0MM X 13CM (MISCELLANEOUS) ×1 IMPLANT
DRSG MEPILEX SACRM 8.7X9.8 (GAUZE/BANDAGES/DRESSINGS) ×1 IMPLANT
GAUZE 4X4 16PLY RFD (DISPOSABLE) ×2 IMPLANT
GAUZE SPONGE 4X4 12PLY STRL (GAUZE/BANDAGES/DRESSINGS) ×1 IMPLANT
GAUZE SPONGE 4X4 16PLY XRAY LF (GAUZE/BANDAGES/DRESSINGS) ×2 IMPLANT
GAUZE XEROFORM 5X9 LF (GAUZE/BANDAGES/DRESSINGS) ×2 IMPLANT
GLOVE SKINSENSE NS SZ8.0 LF (GLOVE) ×1
GLOVE SKINSENSE STRL SZ8.0 LF (GLOVE) ×1 IMPLANT
GLOVE SS N UNI LF 8.5 STRL (GLOVE) ×2 IMPLANT
GLOVE SURG POLYISO LF SZ6.5 (GLOVE) ×1 IMPLANT
GLOVE SURG UNDER POLY LF SZ7 (GLOVE) ×5 IMPLANT
GOWN STRL REUS W/TWL LRG LVL3 (GOWN DISPOSABLE) ×3 IMPLANT
GOWN STRL REUS W/TWL XL LVL3 (GOWN DISPOSABLE) ×2 IMPLANT
IV NS IRRIG 3000ML ARTHROMATIC (IV SOLUTION) ×5 IMPLANT
KIT BLADEGUARD II DBL (SET/KITS/TRAYS/PACK) ×2 IMPLANT
KIT TURNOVER CYSTO (KITS) ×2 IMPLANT
MANIFOLD NEPTUNE II (INSTRUMENTS) ×2 IMPLANT
MARKER SKIN DUAL TIP RULER LAB (MISCELLANEOUS) ×2 IMPLANT
NDL HYPO 18GX1.5 BLUNT FILL (NEEDLE) ×1 IMPLANT
NDL HYPO 21X1.5 SAFETY (NEEDLE) ×1 IMPLANT
NDL SPNL 18GX3.5 QUINCKE PK (NEEDLE) ×1 IMPLANT
NEEDLE HYPO 18GX1.5 BLUNT FILL (NEEDLE) ×2 IMPLANT
NEEDLE HYPO 21X1.5 SAFETY (NEEDLE) ×2 IMPLANT
NEEDLE SPNL 18GX3.5 QUINCKE PK (NEEDLE) ×2 IMPLANT
NS IRRIG 1000ML POUR BTL (IV SOLUTION) ×2 IMPLANT
PACK ARTHRO LIMB DRAPE STRL (MISCELLANEOUS) ×2 IMPLANT
PAD ABD 5X9 TENDERSORB (GAUZE/BANDAGES/DRESSINGS) ×2 IMPLANT
PAD ARMBOARD 7.5X6 YLW CONV (MISCELLANEOUS) ×2 IMPLANT
PAD COLD SHLDR WRAP-ON (PAD) ×1 IMPLANT
PADDING CAST COTTON 6X4 STRL (CAST SUPPLIES) ×2 IMPLANT
PADDING WEBRIL 6 STERILE (GAUZE/BANDAGES/DRESSINGS) ×1 IMPLANT
PORT APPOLLO RF 90DEGREE MULTI (SURGICAL WAND) IMPLANT
SET ARTHROSCOPY INST (INSTRUMENTS) ×2 IMPLANT
SET BASIN LINEN APH (SET/KITS/TRAYS/PACK) ×2 IMPLANT
SUT ETHILON 3 0 FSL (SUTURE) ×2 IMPLANT
SYR 10ML LL (SYRINGE) ×2 IMPLANT
SYR 30ML LL (SYRINGE) ×2 IMPLANT
TUBE CONNECTING 12X1/4 (SUCTIONS) ×7 IMPLANT
TUBING IN/OUT FLOW W/MAIN PUMP (TUBING) ×2 IMPLANT

## 2021-02-23 NOTE — Op Note (Addendum)
02/23/2021  2:55 PM  Operative report for arthroscopic lavage left knee  87 year old male with gout left knee.  After aspiration and culture a second specimen grew out Pseudomonas and it was determined that he should have lavage to try to clear the infection  Preop diagnosis septic arthritis left knee  Postop diagnosis same  Procedure arthroscopic lavage left knee  Surgeon Aline Brochure  Anesthesia General  Arthroscopic findings grade 4 arthritis throughout the joint, fibrinous exudate and debris in the joint degenerative tears medial and lateral menisci, synovitis, chalky white purulent material.  There were no assistance  Surgery was done as follows.  After evaluating the patient in the preop area he was cleared for surgery and his left knee was correctly identified and marked  He was taken the operating room for general anesthesia  His left leg was then prepped and draped sterilely  Timeout followed  Standard lateral portal was established and the scope was placed into the joint.  Purulent material came out through the ports.  Once we were in the joint there was heavy amount of debris and synovitis there were tears of the medial and lateral meniscus talus degenerative  The suprapatellar pouch had extensive fibrinous exudate as well as the gutters  We thoroughly irrigated the entire joint we established a medial portal to establish higher flow and then debrided the torn menisci  We suctioned the knee dry and closed the portals with four 3-0 nylon sutures  We injected with 25 cc of Marcaine with epinephrine  We placed a sterile dressing  We placed a Cryo/Cuff  We extubated the patient and took him to the recovery room in stable condition  Postop plan He can start weightbearing tomorrow Physical therapy consult for active and passive range of motion exercises Continue antibiotics Okay to discharge when stable on oral antibiotics Follow-up in the office in 2 weeks

## 2021-02-23 NOTE — Progress Notes (Signed)
At 2130 last night Bladder scan showed over 1000.  Was able to strain and void 500.  In and out yielded 1500.

## 2021-02-23 NOTE — Interval H&P Note (Signed)
History and Physical Interval Note:  02/23/2021 1:40 PM  Philip Mcclain  has presented today for surgery, with the diagnosis of infection.  The various methods of treatment have been discussed with the patient and family. After consideration of risks, benefits and other options for treatment, the patient has consented to  Procedure(s): ARTHROSCOPY LEFT KNEE (Left) as a surgical intervention.  The patient's history has been reviewed, patient examined, no change in status, stable for surgery.  I have reviewed the patient's chart and labs.  Questions were answered to the patient's satisfaction.     [name]

## 2021-02-23 NOTE — Progress Notes (Signed)
Be     PROGRESS NOTE  Philip Mcclain ZJQ:[id number] DOB: 1933/11/23 DOA: 02/19/2021 PCP: Glenda Chroman, MD  HPI/Recap of past 32 hours: 87 year old with past medical history of pulmonary hypertension, persistent atrial fibrillation on Eliquis [place] and hypertension seen by orthopedics in the outpatient setting and underwent left knee aspiration for steroid injection on June 14 with fluid findings consistent with gout, but also grew out Pseudomonas.  Brought into the emergency room with a swollen left knee and confusion on 6/18 and found to be in severe sepsis from infected left knee joint.  Patient started on IV fluids and antibiotics and admitted to the hospitalist service.  Orthopedics consulted and patient to go for left knee washout today, 6/22.  Assessment/Plan: Active Problems:   Pulmonary HTN ([place]): Clinically compensated.    Essential hypertension: Blood pressures have been trending upward.  After surgery, will resume oral antihypertensives.    Persistent atrial fibrillation ([place]): Rate controlled.  Resume Eliquis as per orthopedic surgery Severe pseudomonal sepsis from septic joint of left knee joint [facility]): Patient met criteria for severe sepsis given tachycardia, tachypnea, leukocytosis with acute kidney injury, lactic acidosis, present on admission.  Sepsis stabilized.  Going for knee washout today.  Continue antibiotics.  Previous cultures grew out Pseudomonas.   BPH with obstruction/lower urinary tract symptoms   Sepsis ([place])   Toxic metabolic encephalopathy: Due to sepsis.  Improved with fluids and antibiotics.  Acute hyponatremia: Secondary to hypovolemia.  Much improved with fluids   Code Status: DNR  Family Communication: Left message for family  Disposition Plan: Suspect he may need skilled nursing.  To be determined following knee stabilization   Consultants: Orthopedic surgery  Procedures: Left knee washout planned for 6/22  Antimicrobials: Vancomycin and Flagyl  6/18 only Cefepime 6/18-present  DVT prophylaxis: SCDs.  Eliquis on hold  Level of care: Telemetry   Objective: Vitals:   02/23/21 1600 02/23/21 1616  BP: (!) 159/78 (!) 164/75  Pulse: 62 61  Resp: 12 18  Temp:  (!) 97.5 F (36.4 C)  SpO2: 100% 98%    Intake/Output Summary (Last 24 hours) at 02/23/2021 1631 Last data filed at 02/23/2021 1500 Gross per 24 hour  Intake 3325.6 ml  Output 2230 ml  Net 1095.6 ml   Filed Weights   02/19/21 1248 02/23/21 1223  Weight: 79.8 kg 79.8 kg   Body mass index is 24.54 kg/m.  Exam:  General: Alert and oriented x2, no acute distress Cardiovascular: Regular rate and rhythm, S1-S2 Respiratory: Clear to auscultation bilaterally Abdomen: Soft, nontender, nondistended, positive bowel sounds Musculoskeletal: No clubbing or cyanosis, trace pitting edema.  Left knee swollen, tender Skin: No skin breaks, tears or lesions Psychiatry: Appropriate, no evidence of psychoses Neurology: No focal deficits   Data Reviewed: CBC: Recent Labs  Lab 02/19/21 1318 02/20/21 0618 02/21/21 0557 02/22/21 0600  WBC 22.1* 18.8* 16.5* 15.0*  NEUTROABS 20.0* 16.6*  --   --   HGB 11.7* 10.5* 8.9* 9.2*  HCT 36.0* 32.9* 27.5* 29.3*  MCV 96.3 98.2 98.6 99.3  PLT 493* 456* 381 240   Basic Metabolic Panel: Recent Labs  Lab 02/19/21 1318 02/20/21 0618 02/21/21 0557 02/22/21 0600  NA 132* 131* 133* 133*  K 4.8 4.5 4.4 4.4  CL 100 103 106 107  CO2 21* 21* 21* 19*  GLUCOSE 116* 123* 106* 98  BUN 70* 59* 48* 47*  CREATININE 2.16* 1.79* 1.49* 1.54*  CALCIUM 9.7 9.1 9.2 9.4  MG 2.3  --  2.0  --    GFR: Estimated Creatinine Clearance: 38 mL/min (A) (by C-G formula based on SCr of 1.54 mg/dL (H)). Liver Function Tests: Recent Labs  Lab 02/19/21 1318  AST 20  ALT 24  ALKPHOS 108  BILITOT 1.1  PROT 6.8  ALBUMIN 2.9*   No results for input(s): LIPASE, AMYLASE in the last 168 hours. Recent Labs  Lab 02/19/21 1318  AMMONIA <9*   Coagulation  Profile: Recent Labs  Lab 02/19/21 1318  INR 2.0*   Cardiac Enzymes: Recent Labs  Lab 02/19/21 1318  CKTOTAL 27*   BNP (last 3 results) No results for input(s): PROBNP in the last 8760 hours. HbA1C: No results for input(s): HGBA1C in the last 72 hours. CBG: No results for input(s): GLUCAP in the last 168 hours. Lipid Profile: No results for input(s): CHOL, HDL, LDLCALC, TRIG, CHOLHDL, LDLDIRECT in the last 72 hours. Thyroid Function Tests: No results for input(s): TSH, T4TOTAL, FREET4, T3FREE, THYROIDAB in the last 72 hours. Anemia Panel: Recent Labs    02/22/21 0600  VITAMINB12 1,453*  FOLATE 14.3   Urine analysis:    Component Value Date/Time   COLORURINE YELLOW 02/19/2021 1300   APPEARANCEUR CLEAR 02/19/2021 1300   APPEARANCEUR Clear 01/17/2021 1342   LABSPEC 1.012 02/19/2021 1300   PHURINE 5.0 02/19/2021 1300   GLUCOSEU NEGATIVE 02/19/2021 1300   HGBUR MODERATE (A) 02/19/2021 1300   BILIRUBINUR NEGATIVE 02/19/2021 1300   BILIRUBINUR Negative 01/17/2021 [place] 02/19/2021 1300   PROTEINUR NEGATIVE 02/19/2021 1300   NITRITE NEGATIVE 02/19/2021 [place] 02/19/2021 1300   Sepsis Labs: _0 (procalcitonin:4,lacticidven:4)  ) Recent Results (from the past 240 hour(s))  Wound culture     Status: Abnormal   Collection Time: 02/15/21  1:58 PM   Specimen: Wound  Result Value Ref Range Status   MICRO NUMBER: [id number]  Final   SPECIMEN QUALITY: Adequate  Final   SOURCE: KNEE, LEFT  Final   STATUS: FINAL  Final   GRAM STAIN:   Final    Many Polymorphonuclear leukocytes No organisms seen   ISOLATE 1: Pseudomonas aeruginosa (A)  Final    Comment: Heavy growth of Pseudomonas aeruginosa      Susceptibility   Pseudomonas aeruginosa - AEROBIC CULT, GRAM STAIN NEGATIVE 1    CEFTAZIDIME 4 Sensitive     CEFEPIME <=1 Sensitive     CIPROFLOXACIN 0.5 Sensitive     LEVOFLOXACIN 0.5 Sensitive     GENTAMICIN 2 Sensitive      IMIPENEM 1 Sensitive     PIP/TAZO <=4 Sensitive     TOBRAMYCIN* <=1 Sensitive      * Legend: S = Susceptible  I = Intermediate R = Resistant  NS = Not susceptible * = Not tested  NR = Not reported **NN = See antimicrobic comments   Urine Culture     Status: None   Collection Time: 02/19/21  1:00 PM   Specimen: Urine, Catheterized  Result Value Ref Range Status   Specimen Description   Final    URINE, CATHETERIZED Performed at [facility], [address], [place], [place] [address]    Special Requests   Final    NONE Performed at [facility], [address] [place], [place] [address]    Culture   Final    NO GROWTH Performed at [facility], [address], [place], [place] [address]    Report Status 02/21/2021 FINAL  Final  Resp Panel by RT-PCR (  Flu A&B, Covid) Nasopharyngeal Swab     Status: None   Collection Time: 02/19/21  2:29 PM   Specimen: Nasopharyngeal Swab; Nasopharyngeal(NP) swabs in vial transport medium  Result Value Ref Range Status   SARS Coronavirus 2 by RT PCR NEGATIVE NEGATIVE Final    Comment: (NOTE) SARS-CoV-2 target nucleic acids are NOT DETECTED.  The SARS-CoV-2 RNA is generally detectable in upper respiratory specimens during the acute phase of infection. The lowest concentration of SARS-CoV-2 viral copies this assay can detect is 138 copies/mL. A negative result does not preclude SARS-Cov-2 infection and should not be used as the sole basis for treatment or other patient management decisions. A negative result may occur with  improper specimen collection/handling, submission of specimen other than nasopharyngeal swab, presence of viral mutation(s) within the areas targeted by this assay, and inadequate number of viral copies(<138 copies/mL). A negative result must be combined with clinical observations, patient history, and epidemiological information. The expected result is Negative.  Fact Sheet for Patients:   EntrepreneurPulse.com.au  Fact Sheet for Healthcare Providers:  IncredibleEmployment.be  This test is no t yet approved or cleared by the [place] FDA and  has been authorized for detection and/or diagnosis of SARS-CoV-2 by FDA under an Emergency Use Authorization (EUA). This EUA will remain  in effect (meaning this test can be used) for the duration of the COVID-19 declaration under Section 564(b)(1) of the Act, 21 U.S.C.section 360bbb-3(b)(1), unless the authorization is terminated  or revoked sooner.       Influenza A by PCR NEGATIVE NEGATIVE Final   Influenza B by PCR NEGATIVE NEGATIVE Final    Comment: (NOTE) The Xpert Xpress SARS-CoV-2/FLU/RSV plus assay is intended as an aid in the diagnosis of influenza from Nasopharyngeal swab specimens and should not be used as a sole basis for treatment. Nasal washings and aspirates are unacceptable for Xpert Xpress SARS-CoV-2/FLU/RSV testing.  Fact Sheet for Patients: EntrepreneurPulse.com.au  Fact Sheet for Healthcare Providers: IncredibleEmployment.be  This test is not yet approved or cleared by the [place] FDA and has been authorized for detection and/or diagnosis of SARS-CoV-2 by FDA under an Emergency Use Authorization (EUA). This EUA will remain in effect (meaning this test can be used) for the duration of the COVID-19 declaration under Section 564(b)(1) of the Act, 21 U.S.C. section 360bbb-3(b)(1), unless the authorization is terminated or revoked.  Performed at [facility], [address] [place], [place] [address]   Blood Culture (routine x 2)     Status: None (Preliminary result)   Collection Time: 02/19/21  2:33 PM   Specimen: BLOOD RIGHT HAND  Result Value Ref Range Status   Specimen Description   Final    BLOOD RIGHT HAND BOTTLES DRAWN AEROBIC AND ANAEROBIC   Special Requests   Final    Blood Culture results may not be optimal  due to an inadequate volume of blood received in culture bottles   Culture   Final    NO GROWTH 3 DAYS Performed at [facility], [address] [place], [place] [address]    Report Status PENDING  Incomplete  Blood Culture (routine x 2)     Status: None (Preliminary result)   Collection Time: 02/19/21  2:33 PM   Specimen: BLOOD LEFT ARM  Result Value Ref Range Status   Specimen Description BLOOD LEFT ARM BOTTLES DRAWN AEROBIC AND ANAEROBIC  Final   Special Requests   Final    Blood Culture results may not be optimal due to an  inadequate volume of blood received in culture bottles   Culture   Final    NO GROWTH 3 DAYS Performed at [facility], [address], [place], [place] [address]    Report Status PENDING  Incomplete  Body fluid culture w Gram Stain     Status: None   Collection Time: 02/19/21  3:36 PM   Specimen: KNEE; Body Fluid  Result Value Ref Range Status   Specimen Description   Final    KNEE JOINT FLUID Performed at [facility], [address] [place], [place] [address]    Special Requests   Final    Normal Performed at [facility], [address] [place], [place] [address]    Gram Stain   Final    NO ORGANISMS SEEN WBC PRESENT, PREDOMINANTLY PMN Performed at [facility], [address] [place], [place] [address]    Culture   Final    FEW PSEUDOMONAS AERUGINOSA CRITICAL RESULT CALLED TO, READ BACK BY AND VERIFIED WITH: RN E.WRIGHT ON [id number] AT 1572 BY E.PARRISH Performed at [facility], [place] [address] [place], [place] [address]    Report Status 02/23/2021 FINAL  Final   Organism ID, Bacteria PSEUDOMONAS AERUGINOSA  Final      Susceptibility   Pseudomonas aeruginosa - MIC*    CEFTAZIDIME 2 SENSITIVE Sensitive     CIPROFLOXACIN <=0.25 SENSITIVE Sensitive     GENTAMICIN <=1 SENSITIVE Sensitive     IMIPENEM 1 SENSITIVE Sensitive     PIP/TAZO <=4 SENSITIVE Sensitive     CEFEPIME 2 SENSITIVE Sensitive     * FEW PSEUDOMONAS AERUGINOSA  Surgical  PCR screen     Status: None   Collection Time: 02/23/21  2:36 AM   Specimen: Nasal Mucosa; Nasal Swab  Result Value Ref Range Status   MRSA, PCR NEGATIVE NEGATIVE Final   Staphylococcus aureus NEGATIVE NEGATIVE Final    Comment: (NOTE) The Xpert SA Assay (FDA approved for NASAL specimens in patients 10 years of age and older), is one component of a comprehensive surveillance program. It is not intended to diagnose infection nor to guide or monitor treatment. Performed at [facility], [address] [place], [place] [address]       Studies: No results found.  Scheduled Meds:  acetaminophen  650 mg Oral Q6H   allopurinol  100 mg Oral Daily   finasteride  5 mg Oral Daily   irbesartan  150 mg Oral Daily   metoprolol succinate  50 mg Oral BID   pantoprazole  40 mg Oral QPM   tamsulosin  0.4 mg Oral BID    Continuous Infusions:  sodium chloride 60 mL/hr at 02/23/21 1623   ceFEPime (MAXIPIME) IV 2 g (02/23/21 0835)     LOS: 4 days     Annita Brod, MD [facility]   02/23/2021, 4:31 PM

## 2021-02-23 NOTE — Progress Notes (Signed)
Bladder scan showed zero

## 2021-02-23 NOTE — Anesthesia Preprocedure Evaluation (Signed)
Anesthesia Evaluation  Patient identified by MRN, date of birth, ID band Patient awake and Patient confused    Reviewed: Allergy & Precautions, NPO status , Patient's Chart, lab work & pertinent test results, reviewed documented beta blocker date and time   History of Anesthesia Complications Negative for: history of anesthetic complications  Airway Mallampati: II  TM Distance: >3 FB Neck ROM: Full    Dental  (+) Edentulous Upper, Edentulous Lower   Pulmonary former smoker,    Pulmonary exam normal breath sounds clear to auscultation       Cardiovascular Exercise Tolerance: Poor hypertension, Pt. on medications and Pt. on home beta blockers + dysrhythmias Atrial Fibrillation + Valvular Problems/Murmurs (TR, Pulmonary HTN)  Rhythm:Irregular Rate:Normal  1. Left ventricular ejection fraction, by estimation, is 60 to 65%. The left ventricle has normal function. The left ventricle has no regional  wall motion abnormalities. There is mild concentric left ventricular hypertrophy. Left ventricular diastolic  function could not be evaluated. Elevated left ventricular end-diastolic pressure.  2. Right ventricular systolic function is moderately reduced. The right ventricular size is not well visualized. There is severely elevated  pulmonary artery systolic pressure. The estimated right ventricular  systolic pressure is 99991111 mmHg.  3. Left atrial size was moderately dilated.  4. Right atrial size was severely dilated.  5. The mitral valve is abnormal. Mild mitral valve regurgitation. Mild  mitral stenosis. Severe mitral annular calcification.  6. Tricuspid valve regurgitation is moderate to severe.  7. The aortic valve is calcified. There is moderate calcification of the aortic valve. There is mild thickening of the aortic valve. Aortic valve regurgitation is trivial. Mild aortic valve stenosis.  8. The inferior vena cava is dilated  in size with <50% respiratory variability, suggesting right atrial pressure of 15 mmHg.    Neuro/Psych CVA negative psych ROS   GI/Hepatic GERD  Medicated,  Endo/Other  negative endocrine ROS  Renal/GU Renal InsufficiencyRenal disease     Musculoskeletal  (+) Arthritis ,   Abdominal   Peds  Hematology negative hematology ROS (+)   Anesthesia Other Findings   Reproductive/Obstetrics                            Anesthesia Physical Anesthesia Plan  ASA: 4  Anesthesia Plan: General   Post-op Pain Management:    Induction: Intravenous  PONV Risk Score and Plan: 3 and Ondansetron  Airway Management Planned: LMA  Additional Equipment:   Intra-op Plan:   Post-operative Plan: Extubation in OR  Informed Consent: I have reviewed the patients History and Physical, chart, labs and discussed the procedure including the risks, benefits and alternatives for the proposed anesthesia with the patient or authorized representative who has indicated his/her understanding and acceptance.    Discussed DNR with power of attorney and Suspend DNR.   Dental advisory given  Plan Discussed with: CRNA and Surgeon  Anesthesia Plan Comments:         Anesthesia Quick Evaluation

## 2021-02-23 NOTE — Progress Notes (Signed)
Patient arrived back from surgery with stable vitals. He was alert and spoke to [place], does not seem completely oriented. Placed patient's hearing aides in his ears to assist with him being able to speak with [place]. He has a dry and intake dressing to his left knee.  Patient IV fluids adjusted per order. He has a diet for advance as tolerated. He took ice chips without any nausea/vomiting, so MD reordered his diet. CNA applied condom cath to patient for potential incontinence from drowsiness due to pain medications.

## 2021-02-23 NOTE — Plan of Care (Signed)

## 2021-02-23 NOTE — Transfer of Care (Signed)
Immediate Anesthesia Transfer of Care Note  Patient: Philip Mcclain  Procedure(s) Performed: ARTHROSCOPIC LAVAGE LEFT KNEE (Left: Knee)  Patient Location: PACU  Anesthesia Type:General  Level of Consciousness: drowsy  Airway & Oxygen Therapy: Patient Spontanous Breathing and Patient connected to nasal cannula oxygen  Post-op Assessment: Report given to RN and Post -op Vital signs reviewed and stable  Post vital signs: Reviewed and stable  Last Vitals:  Vitals Value Taken Time  BP 150/69   Temp 97.7   Pulse 56 02/23/21 1458  Resp 19 02/23/21 1458  SpO2 96 % 02/23/21 1458  Vitals shown include unvalidated device data.  Last Pain:  Vitals:   02/23/21 1223  TempSrc: Oral  PainSc:          Complications: No notable events documented.

## 2021-02-23 NOTE — Progress Notes (Signed)
PT Cancellation Note  Patient Details Name: Philip Mcclain MRN: [id number] DOB: 09/17/1933   Cancelled Treatment:    Reason Eval/Treat Not Completed: Other (comment) (planned arthroscopy 02/23/21). Per chart review, pt going for L knee arthroscopy today. Will hold PT eval until appropriate.     Tori Coleston Dirosa PT, DPT 02/23/21, 9:04 AM

## 2021-02-23 NOTE — Anesthesia Postprocedure Evaluation (Signed)
Anesthesia Post Note  Patient: Philip Mcclain  Procedure(s) Performed: ARTHROSCOPIC LAVAGE LEFT KNEE (Left: Knee)  Patient location during evaluation: PACU Anesthesia Type: General Level of consciousness: awake and sedated Pain management: pain level controlled Vital Signs Assessment: post-procedure vital signs reviewed and stable Respiratory status: spontaneous breathing, respiratory function stable and patient connected to nasal cannula oxygen Cardiovascular status: blood pressure returned to baseline and stable Postop Assessment: no apparent nausea or vomiting Anesthetic complications: no   No notable events documented.   Last Vitals:  Vitals:   02/23/21 1223 02/23/21 1500  BP: 139/80 (!) 150/71  Pulse: 62 70  Resp: 17 16  Temp: 36.9 C 36.5 C  SpO2: 95% 97%    Last Pain:  Vitals:   02/23/21 1223  TempSrc: Oral  PainSc:                  Tyrisha Benninger C Elder Davidian

## 2021-02-23 NOTE — Anesthesia Procedure Notes (Signed)
Procedure Name: LMA Insertion Date/Time: 02/23/2021 1:53 PM Performed by: Karna Dupes, CRNA Pre-anesthesia Checklist: Patient identified, Emergency Drugs available, Patient being monitored and Suction available Patient Re-evaluated:Patient Re-evaluated prior to induction Oxygen Delivery Method: Circle system utilized Preoxygenation: Pre-oxygenation with 100% oxygen Induction Type: IV induction LMA: LMA inserted LMA Size: 4.0 Number of attempts: 1 Tube secured with: Tape Dental Injury: Teeth and Oropharynx as per pre-operative assessment

## 2021-02-23 NOTE — Brief Op Note (Signed)
02/23/2021  2:53 PM  PATIENT:  Philip Mcclain  87 y.o. male  PRE-OPERATIVE DIAGNOSIS:  infection left knee  POST-OPERATIVE DIAGNOSIS:  septic arthritis left knee  PROCEDURE:  Procedure(s): ARTHROSCOPIC LAVAGE LEFT KNEE (Left)  SURGEON:  Surgeon(s) and Role:    Carole Civil, MD - Primary  PHYSICIAN ASSISTANT:   ASSISTANTS: none   ANESTHESIA:   general  EBL:  0 mL   BLOOD ADMINISTERED:none  DRAINS: none   LOCAL MEDICATIONS USED:  MARCAINE     SPECIMEN:  No Specimen  DISPOSITION OF SPECIMEN:  N/A  COUNTS:  YES  TOURNIQUET:  * Missing tourniquet times found for documented tourniquets in log: [id number] *  DICTATION: .Viviann Spare Dictation  PLAN OF CARE: Admit to inpatient   PATIENT DISPOSITION:  PACU - hemodynamically stable.   Delay start of Pharmacological VTE agent (>24hrs) due to surgical blood loss or risk of bleeding: no

## 2021-02-24 LAB — CBC
HCT: 29.1 % — ABNORMAL LOW (ref 39.0–52.0)
Hemoglobin: 9.2 g/dL — ABNORMAL LOW (ref 13.0–17.0)
MCH: 31.7 pg (ref 26.0–34.0)
MCHC: 31.6 g/dL (ref 30.0–36.0)
MCV: 100.3 fL — ABNORMAL HIGH (ref 80.0–100.0)
Platelets: 351 10*3/uL (ref 150–400)
RBC: 2.9 MIL/uL — ABNORMAL LOW (ref 4.22–5.81)
RDW: 14.6 % (ref 11.5–15.5)
WBC: 17.1 10*3/uL — ABNORMAL HIGH (ref 4.0–10.5)
nRBC: 0 % (ref 0.0–0.2)

## 2021-02-24 LAB — COMPREHENSIVE METABOLIC PANEL
ALT: 15 U/L (ref 0–44)
AST: 13 U/L — ABNORMAL LOW (ref 15–41)
Albumin: 2.1 g/dL — ABNORMAL LOW (ref 3.5–5.0)
Alkaline Phosphatase: 80 U/L (ref 38–126)
Anion gap: 6 (ref 5–15)
BUN: 37 mg/dL — ABNORMAL HIGH (ref 8–23)
CO2: 19 mmol/L — ABNORMAL LOW (ref 22–32)
Calcium: 9.2 mg/dL (ref 8.9–10.3)
Chloride: 109 mmol/L (ref 98–111)
Creatinine, Ser: 1.4 mg/dL — ABNORMAL HIGH (ref 0.61–1.24)
GFR, Estimated: 50 mL/min — ABNORMAL LOW (ref >60)
Glucose, Bld: 90 mg/dL (ref 70–99)
Potassium: 4.6 mmol/L (ref 3.5–5.1)
Sodium: 134 mmol/L — ABNORMAL LOW (ref 135–145)
Total Bilirubin: 0.8 mg/dL (ref 0.3–1.2)
Total Protein: 5.1 g/dL — ABNORMAL LOW (ref 6.5–8.1)

## 2021-02-24 LAB — LACTIC ACID, PLASMA: Lactic Acid, Venous: 1.5 mmol/L (ref 0.5–1.9)

## 2021-02-24 MED ORDER — FUROSEMIDE 40 MG PO TABS
40.0000 mg | ORAL_TABLET | Freq: Every day | ORAL | Status: DC
  Administered 2021-02-24 – 2021-02-26 (×3): 40 mg via ORAL
  Filled 2021-02-24 (×3): qty 1

## 2021-02-24 MED ORDER — ENOXAPARIN SODIUM 40 MG/0.4ML IJ SOSY
40.0000 mg | PREFILLED_SYRINGE | INTRAMUSCULAR | Status: DC
  Administered 2021-02-25: 40 mg via SUBCUTANEOUS
  Filled 2021-02-24 (×2): qty 0.4

## 2021-02-24 MED ORDER — KETOROLAC TROMETHAMINE 15 MG/ML IJ SOLN
15.0000 mg | Freq: Once | INTRAMUSCULAR | Status: AC
  Administered 2021-02-24: 15 mg via INTRAVENOUS
  Filled 2021-02-24: qty 1

## 2021-02-24 NOTE — TOC Initial Note (Signed)
Transition of Care [facility]) - Initial/Assessment Note    Patient Details  Name: Philip Mcclain MRN: [id number] Date of Birth: [birth year]  [facility] CM/SW Contact:    Iona Beard, [place] Phone Number: 02/24/2021, 11:24 AM  Clinical Narrative:                 CSW spoke with pts wife to complete assessment due to pts orientation. Pt lives with his wife. Pt was needing assistance with his ADLs per wife. Pt nor his wife drive but they have relatives/friends provide transportation when needed. Pt has not had [place] services nor does he use any DME. CSW informed pts wife that PT is recommending SNF for pt, CSW inquired about interest in SNF at D/C. She states she does not think pt will be agreeable to this and is not sure if he will want HH. TOC to follow.   Expected Discharge Plan: [place] Barriers to Discharge: Continued Medical Work up   Patient Goals and CMS Choice Patient states their goals for this hospitalization and ongoing recovery are:: Go home CMS Medicare.gov Compare Post Acute Care list provided to:: Patient Represenative (must comment) Choice offered to / list presented to : Spouse  Expected Discharge Plan and Services Expected Discharge Plan: [place] Choice: [place] arrangements for the past 2 months: Single Family Home                                      Prior Living Arrangements/Services Living arrangements for the past 2 months: Single Family Home Lives with:: Spouse Patient language and need for interpreter reviewed:: Yes Do you feel safe going back to the place where you live?: Yes      Need for Family Participation in Patient Care: Yes (Comment) Care giver support system in place?: Yes (comment)   Criminal Activity/Legal Involvement Pertinent to Current Situation/Hospitalization: No - Comment as needed  Activities of Daily Living   ADL Screening (condition at time of  admission) Patient's cognitive ability adequate to safely complete daily activities?: No Is the patient deaf or have difficulty hearing?: Yes Does the patient have difficulty seeing, even when wearing glasses/contacts?: No Does the patient have difficulty concentrating, remembering, or making decisions?: Yes Patient able to express need for assistance with ADLs?: No Does the patient have difficulty dressing or bathing?: Yes Independently performs ADLs?: No Does the patient have difficulty walking or climbing stairs?: Yes Weakness of Legs: Left Weakness of Arms/Hands: None  Permission Sought/Granted                  Emotional Assessment Appearance:: Appears stated age Attitude/Demeanor/Rapport: Unable to Assess Affect (typically observed): Unable to Assess Orientation: : Fluctuating Orientation (Suspected and/or reported [place]) Alcohol / Substance Use: Not Applicable Psych Involvement: No (comment)  Admission diagnosis:  Effusion into joint [M25.40] Septic joint of left knee joint (HCC) [M00.9] Pyogenic arthritis of left knee joint, due to unspecified organism ([place]) [M00.9] Sepsis without acute organ dysfunction, due to unspecified organism [facility]) [A41.9] Patient Active Problem List   Diagnosis Date Noted   Septic joint of left knee joint ([place]) 02/19/2021   BPH with obstruction/lower urinary tract symptoms 02/19/2021   Sepsis ([place]) 123456   Toxic metabolic encephalopathy 123456   Pulmonary HTN ([place]) 12/28/2020   Dyslipidemia 12/28/2020   Essential  hypertension 12/28/2020   Persistent atrial fibrillation ([place]) 12/28/2020   CVA (cerebral vascular accident) ([place]) 08/19/2020   AKI (acute kidney injury) ([place])    PCP:  Glenda Chroman, MD Pharmacy:   [facility], [place] [address] Phone: [phone] Fax: [phone]     Social Determinants of Health (SDOH) Interventions    Readmission Risk  Interventions No flowsheet data found.

## 2021-02-24 NOTE — Progress Notes (Signed)
Be     PROGRESS NOTE  Philip Mcclain:[id number] DOB: 17-Dec-1933 DOA: 02/19/2021 PCP: Glenda Chroman, MD  HPI/Recap of past 40 hours: 87 year old with past medical history of pulmonary hypertension, persistent atrial fibrillation on Eliquis Hache and hypertension seen by orthopedics in the outpatient setting and underwent left knee aspiration for steroid injection on June 14 with fluid findings consistent with gout, but also grew out Pseudomonas.  Brought into the emergency room with a swollen left knee and confusion on 6/18 and found to be in severe sepsis from infected left knee joint.  Patient started on IV fluids and antibiotics and admitted to the hospitalist service.  Orthopedics consulted and patient underwent left knee washout today, 6/22.  Today, patient complains of severe left knee pain.  No real other complaints.  Assessment/Plan: Active Problems:   Pulmonary HTN ([place]): Clinically compensated.    Essential hypertension: Blood pressures have been trending upward.  Have restarted patient's Lasix.  Has responded with some diuresis.    Persistent atrial fibrillation ([place]): Rate controlled.  Resume Eliquis as per orthopedic surgery  Severe pseudomonal sepsis from septic joint of left knee joint [facility]): Patient met criteria for severe sepsis given tachycardia, tachypnea, leukocytosis with acute kidney injury, lactic acidosis, present on admission.  Sepsis stabilized.  Lactic acid now normalized.  Going for knee washout today.  Continue antibiotics.  Previous cultures grew out Pseudomonas.  Have added Toradol x1 for knee pain in addition to his other pain medications.    BPH with obstruction/lower urinary tract symptoms    Toxic metabolic encephalopathy: Due to sepsis.  Improved with fluids and antibiotics.  Acute hyponatremia: Secondary to hypovolemia.  Much improved with fluids   Code Status: DNR  Family Communication: Left message for family  Disposition Plan: Suspect he may need  skilled nursing.  To be determined following knee stabilization   Consultants: Orthopedic surgery  Procedures: Left knee washout 6/22  Antimicrobials: Vancomycin and Flagyl 6/18 only Cefepime 6/18-present  DVT prophylaxis: SCDs.  Eliquis on hold  Level of care: Telemetry   Objective: Vitals:   02/24/21 0116 02/24/21 0431  BP: 111/66 125/84  Pulse: (!) 57 62  Resp: 16 20  Temp: 97.6 F (36.4 C) 98.7 F (37.1 C)  SpO2:  94%    Intake/Output Summary (Last 24 hours) at 02/24/2021 0953 Last data filed at 02/24/2021 0500 Gross per 24 hour  Intake 2920.23 ml  Output 500 ml  Net 2420.23 ml    Filed Weights   02/19/21 1248 02/23/21 1223  Weight: 79.8 kg 79.8 kg   Body mass index is 24.54 kg/m.  Exam:  General: Alert and oriented x2, moderate distress secondary to knee pain Cardiovascular: Regular rate and rhythm, S1-S2 Respiratory: Clear to auscultation bilaterally Abdomen: Soft, nontender, nondistended, positive bowel sounds Musculoskeletal: No clubbing or cyanosis, trace pitting edema.  Left knee bandaged, tender, swollen Skin: No skin breaks, tears or lesions Psychiatry: Appropriate, no evidence of psychoses Neurology: No focal deficits   Data Reviewed: CBC: Recent Labs  Lab 02/19/21 1318 02/20/21 0618 02/21/21 0557 02/22/21 0600 02/23/21 1729 02/24/21 0357  WBC 22.1* 18.8* 16.5* 15.0* 16.7* 17.1*  NEUTROABS 20.0* 16.6*  --   --   --   --   HGB 11.7* 10.5* 8.9* 9.2* 10.3* 9.2*  HCT 36.0* 32.9* 27.5* 29.3* 32.8* 29.1*  MCV 96.3 98.2 98.6 99.3 99.4 100.3*  PLT 493* 456* 381 379 362 791    Basic Metabolic Panel: Recent Labs  Lab 02/19/21 1318  02/20/21 0618 02/21/21 0557 02/22/21 0600 02/23/21 1729 02/24/21 0357  NA 132* 131* 133* 133*  --  134*  K 4.8 4.5 4.4 4.4  --  4.6  CL 100 103 106 107  --  109  CO2 21* 21* 21* 19*  --  19*  GLUCOSE 116* 123* 106* 98  --  90  BUN 70* 59* 48* 47*  --  37*  CREATININE 2.16* 1.79* 1.49* 1.54* 1.45*  1.40*  CALCIUM 9.7 9.1 9.2 9.4  --  9.2  MG 2.3  --  2.0  --   --   --     GFR: Estimated Creatinine Clearance: 41.8 mL/min (A) (by C-G formula based on SCr of 1.4 mg/dL (H)). Liver Function Tests: Recent Labs  Lab 02/19/21 1318 02/24/21 0357  AST 20 13*  ALT 24 15  ALKPHOS 108 80  BILITOT 1.1 0.8  PROT 6.8 5.1*  ALBUMIN 2.9* 2.1*    No results for input(s): LIPASE, AMYLASE in the last 168 hours. Recent Labs  Lab 02/19/21 1318  AMMONIA <9*    Coagulation Profile: Recent Labs  Lab 02/19/21 1318  INR 2.0*    Cardiac Enzymes: Recent Labs  Lab 02/19/21 1318  CKTOTAL 27*    BNP (last 3 results) No results for input(s): PROBNP in the last 8760 hours. HbA1C: No results for input(s): HGBA1C in the last 72 hours. CBG: No results for input(s): GLUCAP in the last 168 hours. Lipid Profile: No results for input(s): CHOL, HDL, LDLCALC, TRIG, CHOLHDL, LDLDIRECT in the last 72 hours. Thyroid Function Tests: No results for input(s): TSH, T4TOTAL, FREET4, T3FREE, THYROIDAB in the last 72 hours. Anemia Panel: Recent Labs    02/22/21 0600  VITAMINB12 1,453*  FOLATE 14.3    Urine analysis:    Component Value Date/Time   COLORURINE YELLOW 02/19/2021 1300   APPEARANCEUR CLEAR 02/19/2021 1300   APPEARANCEUR Clear 01/17/2021 1342   LABSPEC 1.012 02/19/2021 1300   PHURINE 5.0 02/19/2021 1300   GLUCOSEU NEGATIVE 02/19/2021 1300   HGBUR MODERATE (A) 02/19/2021 1300   BILIRUBINUR NEGATIVE 02/19/2021 1300   BILIRUBINUR Negative 01/17/2021 [place] 02/19/2021 1300   PROTEINUR NEGATIVE 02/19/2021 1300   NITRITE NEGATIVE 02/19/2021 1300   LEUKOCYTESUR NEGATIVE 02/19/2021 1300   Sepsis Labs: $[email or web address]$ (procalcitonin:4,lacticidven:4)  ) Recent Results (from the past 240 hour(s))  Wound culture     Status: Abnormal   Collection Time: 02/15/21  1:58 PM   Specimen: Wound  Result Value Ref Range Status   MICRO NUMBER: [id number]  Final   SPECIMEN QUALITY:  Adequate  Final   SOURCE: KNEE, LEFT  Final   STATUS: FINAL  Final   GRAM STAIN:   Final    Many Polymorphonuclear leukocytes No organisms seen   ISOLATE 1: Pseudomonas aeruginosa (A)  Final    Comment: Heavy growth of Pseudomonas aeruginosa      Susceptibility   Pseudomonas aeruginosa - AEROBIC CULT, GRAM STAIN NEGATIVE 1    CEFTAZIDIME 4 Sensitive     CEFEPIME <=1 Sensitive     CIPROFLOXACIN 0.5 Sensitive     LEVOFLOXACIN 0.5 Sensitive     GENTAMICIN 2 Sensitive     IMIPENEM 1 Sensitive     PIP/TAZO <=4 Sensitive     TOBRAMYCIN* <=1 Sensitive      * Legend: S = Susceptible  I = Intermediate R = Resistant  NS = Not susceptible * = Not tested  NR = Not reported **NN = See antimicrobic comments  Urine Culture     Status: None   Collection Time: 02/19/21  1:00 PM   Specimen: Urine, Catheterized  Result Value Ref Range Status   Specimen Description   Final    URINE, CATHETERIZED Performed at [facility], [address], [place], [place] [address]    Special Requests   Final    NONE Performed at [facility], [address] [place], [place] [address]    Culture   Final    NO GROWTH Performed at [facility], [place] [address], [place], [place] [address]    Report Status 02/21/2021 FINAL  Final  Resp Panel by RT-PCR (Flu A&B, Covid) Nasopharyngeal Swab     Status: None   Collection Time: 02/19/21  2:29 PM   Specimen: Nasopharyngeal Swab; Nasopharyngeal(NP) swabs in vial transport medium  Result Value Ref Range Status   SARS Coronavirus 2 by RT PCR NEGATIVE NEGATIVE Final    Comment: (NOTE) SARS-CoV-2 target nucleic acids are NOT DETECTED.  The SARS-CoV-2 RNA is generally detectable in upper respiratory specimens during the acute phase of infection. The lowest concentration of SARS-CoV-2 viral copies this assay can detect is 138 copies/mL. A negative result does not preclude SARS-Cov-2 infection and should not be used as the sole basis for treatment or other patient  management decisions. A negative result may occur with  improper specimen collection/handling, submission of specimen other than nasopharyngeal swab, presence of viral mutation(s) within the areas targeted by this assay, and inadequate number of viral copies(<138 copies/mL). A negative result must be combined with clinical observations, patient history, and epidemiological information. The expected result is Negative.  Fact Sheet for Patients:  EntrepreneurPulse.com.au  Fact Sheet for Healthcare Providers:  IncredibleEmployment.be  This test is no t yet approved or cleared by the [place] FDA and  has been authorized for detection and/or diagnosis of SARS-CoV-2 by FDA under an Emergency Use Authorization (EUA). This EUA will remain  in effect (meaning this test can be used) for the duration of the COVID-19 declaration under Section 564(b)(1) of the Act, 21 U.S.C.section 360bbb-3(b)(1), unless the authorization is terminated  or revoked sooner.       Influenza A by PCR NEGATIVE NEGATIVE Final   Influenza B by PCR NEGATIVE NEGATIVE Final    Comment: (NOTE) The Xpert Xpress SARS-CoV-2/FLU/RSV plus assay is intended as an aid in the diagnosis of influenza from Nasopharyngeal swab specimens and should not be used as a sole basis for treatment. Nasal washings and aspirates are unacceptable for Xpert Xpress SARS-CoV-2/FLU/RSV testing.  Fact Sheet for Patients: EntrepreneurPulse.com.au  Fact Sheet for Healthcare Providers: IncredibleEmployment.be  This test is not yet approved or cleared by the [place] FDA and has been authorized for detection and/or diagnosis of SARS-CoV-2 by FDA under an Emergency Use Authorization (EUA). This EUA will remain in effect (meaning this test can be used) for the duration of the COVID-19 declaration under Section 564(b)(1) of the Act, 21 U.S.C. section 360bbb-3(b)(1),  unless the authorization is terminated or revoked.  Performed at [facility], [address] [place],  [address]   Blood Culture (routine x 2)     Status: None (Preliminary result)   Collection Time: 02/19/21  2:33 PM   Specimen: BLOOD RIGHT HAND  Result Value Ref Range Status   Specimen Description   Final    BLOOD RIGHT HAND BOTTLES DRAWN AEROBIC AND ANAEROBIC   Special Requests   Final    Blood Culture results may not be  optimal due to an inadequate volume of blood received in culture bottles   Culture   Final    NO GROWTH 3 DAYS Performed at [facility], [address] [place], [place] [address]    Report Status PENDING  Incomplete  Blood Culture (routine x 2)     Status: None (Preliminary result)   Collection Time: 02/19/21  2:33 PM   Specimen: BLOOD LEFT ARM  Result Value Ref Range Status   Specimen Description BLOOD LEFT ARM BOTTLES DRAWN AEROBIC AND ANAEROBIC  Final   Special Requests   Final    Blood Culture results may not be optimal due to an inadequate volume of blood received in culture bottles   Culture   Final    NO GROWTH 3 DAYS Performed at [facility], [address], [place], [place] [address]    Report Status PENDING  Incomplete  Body fluid culture w Gram Stain     Status: None   Collection Time: 02/19/21  3:36 PM   Specimen: KNEE; Body Fluid  Result Value Ref Range Status   Specimen Description   Final    KNEE JOINT FLUID Performed at [facility], [address], [place], [place] [address]    Special Requests   Final    Normal Performed at [facility], [address], [place], [place] [address]    Gram Stain   Final    NO ORGANISMS SEEN WBC PRESENT, PREDOMINANTLY PMN Performed at [facility], [address], [place], [place] [address]    Culture   Final    FEW PSEUDOMONAS AERUGINOSA CRITICAL RESULT CALLED TO, READ BACK BY AND VERIFIED WITH: RN E.WRIGHT ON [id number] AT 1155 BY E.PARRISH Performed at [facility], [place] [address], [place], [place] [address]    Report Status 02/23/2021 FINAL  Final   Organism ID, Bacteria PSEUDOMONAS AERUGINOSA  Final      Susceptibility   Pseudomonas aeruginosa - MIC*    CEFTAZIDIME 2 SENSITIVE Sensitive     CIPROFLOXACIN <=0.25 SENSITIVE Sensitive     GENTAMICIN <=1 SENSITIVE Sensitive     IMIPENEM 1 SENSITIVE Sensitive     PIP/TAZO <=4 SENSITIVE Sensitive     CEFEPIME 2 SENSITIVE Sensitive     * FEW PSEUDOMONAS AERUGINOSA  Surgical PCR screen     Status: None   Collection Time: 02/23/21  2:36 AM   Specimen: Nasal Mucosa; Nasal Swab  Result Value Ref Range Status   MRSA, PCR NEGATIVE NEGATIVE Final   Staphylococcus aureus NEGATIVE NEGATIVE Final    Comment: (NOTE) The Xpert SA Assay (FDA approved for NASAL specimens in patients 12 years of age and older), is one component of a comprehensive surveillance program. It is not intended to diagnose infection nor to guide or monitor treatment. Performed at [facility], [address], [place], [place] [address]       Studies: No results found.  Scheduled Meds:  acetaminophen  650 mg Oral Q6H   allopurinol  100 mg Oral Daily   docusate sodium  100 mg Oral BID   enoxaparin (LOVENOX) injection  30 mg Subcutaneous Q24H   finasteride  5 mg Oral Daily   irbesartan  150 mg Oral Daily   metoprolol succinate  50 mg Oral BID   pantoprazole  40 mg Oral QPM   tamsulosin  0.4 mg Oral BID    Continuous Infusions:  sodium chloride 60 mL/hr at 02/23/21 1623   sodium chloride 100 mL/hr at 02/24/21 2244  ceFEPime (MAXIPIME) IV 2 g (02/24/21 0843)     LOS: 5 days     Annita Brod, MD [facility]   02/24/2021, 9:53 AM

## 2021-02-24 NOTE — Evaluation (Signed)
Physical Therapy Evaluation Patient Details Name: Philip Mcclain MRN: [id number] DOB: May 05, 1934 Today's Date: 02/24/2021   History of Present Illness  87 year old with past medical history of pulmonary hypertension, persistent atrial fibrillation on Eliquis H[place] and hypertension seen by orthopedics in the outpatient setting and underwent left knee aspiration for steroid injection on June 14 with fluid findings consistent with gout, but also grew out Pseudomonas.  Brought into the emergency room with a swollen left knee and confusion on 6/18 and found to be in severe sepsis from infected left knee joint.  Patient started on IV fluids and antibiotics and admitted to the hospitalist service.  Orthopedics consulted and patient to go for left knee washout/lavage, 6/22.   Clinical Impression  Patient is agreeable to participation on PT evaluation. Grandson present at very beginning of session, reporting patient lives with his wife and grandson and daughter "live close by." Patient requires min to max assist for all mobility and was unable to bear enough weight through his left leg due to pain to unload right leg to take side steps to chair. Left knee PROM, AROM, AAROM limited to a total of 20 degrees flexion/extension. Patient was transferred back into bed at end of session.     Follow Up Recommendations SNF;Supervision for mobility/OOB;Supervision/Assistance - 24 hour    Equipment Recommendations       Recommendations for Other Services       Precautions / Restrictions Precautions Precautions: Fall Restrictions Weight Bearing Restrictions: Yes LLE Weight Bearing: Weight bearing as tolerated Other Position/Activity Restrictions: PROM/AAROM Left knee      Mobility  Bed Mobility Overal bed mobility: Needs Assistance Bed Mobility: Supine to Sit;Sit to Supine     Supine to sit: Mod assist Sit to supine: Mod assist   General bed mobility comments: min assist and verbal/tactile cues to use  bedrails to pull himself up in bed.    Transfers Overall transfer level: Needs assistance Equipment used: Rolling walker (2 wheeled);None Transfers: Sit to/from [employer];Anterior-Posterior Transfer Sit to Stand: Max [employer]er, television/film set transfers: Mod assist  Lateral/Scoot Transfers: Min assist    Ambulation/Gait             General Gait Details: not attempted today as patient's left knee too painful and patient unabel to bear enough weight thru left leg to move the right.  Stairs            Wheelchair Mobility    Modified Rankin (Stroke Patients Only)       Balance Overall balance assessment: Needs assistance Sitting-balance support: Feet supported;Bilateral upper extremity supported Sitting balance-Leahy Scale: Fair     Standing balance support: Bilateral upper extremity supported;During functional activity Standing balance-Leahy Scale: Poor                               Pertinent Vitals/Pain Pain Assessment: Faces Faces Pain Scale: Hurts whole lot Pain Location: left knee Pain Descriptors / Indicators: Guarding;Grimacing;Sharp;Moaning;Other (Comment) (crepitus noted with PROM/AAROM) Pain Intervention(s): Limited activity within patient's tolerance;Monitored during session;Repositioned;Ice applied    Home Living Family/patient expects to be discharged to:: Skilled nursing facility                      Prior Function Level of Independence: Independent with assistive device(s);Needs assistance               Hand Dominance  Extremity/Trunk Assessment   Upper Extremity Assessment Upper Extremity Assessment: Generalized weakness    Lower Extremity Assessment Lower Extremity Assessment: Generalized weakness;LLE deficits/detail LLE: Unable to fully assess due to pain LLE Coordination: decreased gross motor    Cervical / Trunk Assessment Cervical / Trunk Assessment: Kyphotic   Communication   Communication: HOH  Cognition Arousal/Alertness: Lethargic;Suspect due to medications Behavior During Therapy: [facility] for tasks assessed/performed Overall Cognitive Status: Impaired/Different from baseline Area of Impairment: Following commands;Problem solving                       Following Commands: Follows one step commands inconsistently     Problem Solving: Slow processing;Difficulty sequencing;Requires verbal cues;Requires tactile cues        General Comments General comments (skin integrity, edema, etc.): ACE bandage over left knee    Exercises General Exercises - Lower Extremity Ankle Circles/Pumps: AAROM;Strengthening;Both;10 reps Short Arc Quad: AAROM;Strengthening;Both;10 reps (crepitus noted on left) Heel Slides: AAROM;Strengthening;Both;10 reps Hip ABduction/ADduction: AAROM;Strengthening;Both;10 reps   Assessment/Plan    PT Assessment Patient needs continued PT services  PT Problem List Decreased strength;Decreased mobility;Decreased activity tolerance;Decreased balance;Pain;Decreased knowledge of use of DME;Decreased cognition       PT Treatment Interventions DME instruction;Therapeutic exercise;Gait training;Balance training;Manual techniques;Functional mobility training;Neuromuscular re-education;Cognitive remediation;Patient/family education;Therapeutic activities    PT Goals (Current goals can be found in the Care Plan section)  Acute Rehab PT Goals Patient Stated Goal: Go home with help from family. PT Goal Formulation: With patient Time For Goal Achievement: 03/10/21 Potential to Achieve Goals: Fair    Frequency Min 3X/week   Barriers to discharge           AM-PAC PT "6 Clicks" Mobility  Outcome Measure Help needed turning from your back to your side while in a flat bed without using bedrails?: A Little Help needed moving from lying on your back to sitting on the side of a flat bed without using bedrails?: A Lot Help  needed moving to and from a bed to a chair (including a wheelchair)?: Total Help needed standing up from a chair using your arms (e.g., wheelchair or bedside chair)?: Total Help needed to walk in hospital room?: Total Help needed climbing 3-5 steps with a railing? : Total 6 Click Score: 9    End of Session Equipment Utilized During Treatment: Gait belt Activity Tolerance: Patient limited by fatigue;Patient limited by pain;Patient limited by lethargy Patient left: in bed;with call bell/phone within reach;with bed alarm set Nurse Communication: Mobility status;Weight bearing status;Precautions PT Visit Diagnosis: Unsteadiness on feet (R26.81);Other abnormalities of gait and mobility (R26.89);Muscle weakness (generalized) (M62.81);Difficulty in walking, not elsewhere classified (R26.2);Pain Pain - Right/Left: Left Pain - part of body: Knee    Time: 0930-1030 PT Time Calculation (min) (ACUTE ONLY): 60 min   Charges:   PT Evaluation $PT Eval Moderate Complexity: 1 Mod PT Treatments $Therapeutic Activity: 23-37 mins        Floria Raveling. Hartnett-Rands, MS, PT Per [place] [phone]  Pamala Hurry  Hartnett-Rands 02/24/2021, 10:52 AM

## 2021-02-24 NOTE — Plan of Care (Signed)
  Problem: Acute Rehab PT Goals(only PT should resolve) Goal: Pt will Roll Supine to Side Outcome: Progressing Flowsheets (Taken 02/24/2021 1209) Pt will Roll Supine to Side:  min guard  with rail Goal: Pt Will Go Supine/Side To Sit Outcome: Progressing Flowsheets (Taken 02/24/2021 1209) Pt will go Supine/Side to Sit: with min guard assist Note: With rail  Goal: Pt Will Go Sit To Supine/Side Outcome: Progressing Flowsheets (Taken 02/24/2021 1209) Pt will go Sit to Supine/Side: with min guard assist Note: With rail Goal: Patient Will Transfer Sit To/From Stand Outcome: Progressing Flowsheets (Taken 02/24/2021 1209) Patient will transfer sit to/from stand: with moderate assist Goal: Pt Will Transfer Bed To Chair/Chair To Bed Outcome: Progressing Flowsheets (Taken 02/24/2021 1209) Pt will Transfer Bed to Chair/Chair to Bed: with mod assist Goal: Pt Will Ambulate Outcome: Progressing Flowsheets (Taken 02/24/2021 1209) Pt will Ambulate:  10 feet  with rolling walker  with moderate assist   Pamala Hurry D. Hartnett-Rands, MS, PT Per [place] [phone] 02/24/2021

## 2021-02-24 NOTE — Progress Notes (Addendum)
Patient having tremors/spasms in the bilateral arms.  The motions cause patient arms to drop.  Motions make it difficult for patient to feed himself or drink from a cup.  Family states that these motions are new and not normal for the patient.  Provider notified.

## 2021-02-25 LAB — BASIC METABOLIC PANEL
Anion gap: 3 — ABNORMAL LOW (ref 5–15)
BUN: 42 mg/dL — ABNORMAL HIGH (ref 8–23)
CO2: 19 mmol/L — ABNORMAL LOW (ref 22–32)
Calcium: 8.7 mg/dL — ABNORMAL LOW (ref 8.9–10.3)
Chloride: 111 mmol/L (ref 98–111)
Creatinine, Ser: 1.88 mg/dL — ABNORMAL HIGH (ref 0.61–1.24)
GFR, Estimated: 35 mL/min — ABNORMAL LOW (ref >60)
Glucose, Bld: 92 mg/dL (ref 70–99)
Potassium: 4.5 mmol/L (ref 3.5–5.1)
Sodium: 133 mmol/L — ABNORMAL LOW (ref 135–145)

## 2021-02-25 LAB — CBC
HCT: 26.3 % — ABNORMAL LOW (ref 39.0–52.0)
Hemoglobin: 8.2 g/dL — ABNORMAL LOW (ref 13.0–17.0)
MCH: 31.3 pg (ref 26.0–34.0)
MCHC: 31.2 g/dL (ref 30.0–36.0)
MCV: 100.4 fL — ABNORMAL HIGH (ref 80.0–100.0)
Platelets: 295 10*3/uL (ref 150–400)
RBC: 2.62 MIL/uL — ABNORMAL LOW (ref 4.22–5.81)
RDW: 14.6 % (ref 11.5–15.5)
WBC: 14.3 10*3/uL — ABNORMAL HIGH (ref 4.0–10.5)
nRBC: 0 % (ref 0.0–0.2)

## 2021-02-25 LAB — CULTURE, BLOOD (ROUTINE X 2)
Culture: NO GROWTH
Culture: NO GROWTH

## 2021-02-25 MED ORDER — LORAZEPAM 2 MG/ML IJ SOLN
1.0000 mg | Freq: Once | INTRAMUSCULAR | Status: AC
  Administered 2021-02-25: 1 mg via INTRAVENOUS
  Filled 2021-02-25: qty 1

## 2021-02-25 NOTE — Progress Notes (Addendum)
PROGRESS NOTE  Philip Mcclain ZOX:[id number] DOB: [birth year] DOA: 02/19/2021 PCP: Glenda Chroman, MD  HPI/Recap of past 39 hours: 87 year old with past medical history of pulmonary hypertension, persistent atrial fibrillation on Eliquis [place] and hypertension seen by orthopedics in the outpatient setting and underwent left knee aspiration for steroid injection on June 14 with fluid findings consistent with gout, but also grew out Pseudomonas.  Brought into the emergency room with a swollen left knee and confusion on 6/18 and found to be in severe sepsis from infected left knee joint.  Patient started on IV fluids and antibiotics and admitted to the hospitalist service.  Orthopedics consulted and patient underwent left knee washout on 6/22.  Since then was having some severe knee pain over the next 24 hours and on night of 6/23, developed upper extremity tremors  Today, patient feeling better.  Much less knee pain.  Tremors have eased off although not yet completely gone away.    Hospital course:   Pulmonary HTN [facility]): Clinically compensated.    Essential hypertension: Blood pressures have been trending upward.  Have restarted patient's Lasix.  Has responded with significant diuresis and has diuresed almost  8 L since hospitalization    Persistent atrial fibrillation [facility]): Rate controlled.  Resume Eliquis as per orthopedic surgery  Severe pseudomonal sepsis from septic joint of left knee joint [facility]): Patient met criteria for severe sepsis given tachycardia, tachypnea, leukocytosis with acute kidney injury, lactic acidosis, present on admission.  Sepsis stabilized.  Lactic acid now normalized.  Going for knee washout today.  Continue antibiotics.  Previous cultures grew out Pseudomonas and cultures from washout grew out sensitive Pseudomonas added Toradol x1 for knee pain in addition to his other pain medications.    Tremors: Unclear etiology.  From morphine?  Slowly improving    BPH with  obstruction/lower urinary tract symptoms    Toxic metabolic encephalopathy: Due to sepsis.  Improved with fluids and antibiotics.  Acute hyponatremia: Secondary to hypovolemia.  Much improved with fluids   Code Status: DNR  Family Communication: Left message for family  Disposition Plan: Patient does not want to go to skilled nursing.  Home with home health  Consultants: Orthopedic surgery  Procedures: Left knee washout 6/22  Antimicrobials: Vancomycin and Flagyl 6/18 only Cefepime 6/18-present  DVT prophylaxis: SCDs.  Eliquis on hold  Level of care: Telemetry   Objective: Vitals:   02/25/21 0624 02/25/21 0933  BP: 125/83 (!) 146/77  Pulse: (!) 59 61  Resp: 18 13  Temp: 97.6 F (36.4 C) 98.1 F (36.7 C)  SpO2: 98% 99%    Intake/Output Summary (Last 24 hours) at 02/25/2021 0959 Last data filed at 02/25/2021 0700 Gross per 24 hour  Intake 887.51 ml  Output 1100 ml  Net -212.49 ml    Filed Weights   02/19/21 1248 02/23/21 1223  Weight: 79.8 kg 79.8 kg   Body mass index is 24.54 kg/m.  Exam:  General: Alert and oriented x2, no acute distress Cardiovascular: Regular rate and rhythm, S1-S2 Respiratory: Clear to auscultation bilaterally Abdomen: Soft, nontender, nondistended, positive bowel sounds Musculoskeletal: No clubbing or cyanosis, trace pitting edema.  Left knee bandaged, tender, swollen Skin: No skin breaks, tears or lesions Psychiatry: Appropriate, no evidence of psychoses Neurology: No focal deficits   Data Reviewed: CBC: Recent Labs  Lab 02/19/21 1318 02/20/21 0618 02/21/21 0557 02/22/21 0600 02/23/21 1729 02/24/21 0357 02/25/21 0502  WBC 22.1* 18.8* 16.5* 15.0* 16.7* 17.1* 14.3*  NEUTROABS 20.0* 16.6*  --   --   --   --   --  HGB 11.7* 10.5* 8.9* 9.2* 10.3* 9.2* 8.2*  HCT 36.0* 32.9* 27.5* 29.3* 32.8* 29.1* 26.3*  MCV 96.3 98.2 98.6 99.3 99.4 100.3* 100.4*  PLT 493* 456* 381 379 362 351 149    Basic Metabolic Panel: Recent  Labs  Lab 02/19/21 1318 02/20/21 0618 02/21/21 0557 02/22/21 0600 02/23/21 1729 02/24/21 0357 02/25/21 0502  NA 132* 131* 133* 133*  --  134* 133*  K 4.8 4.5 4.4 4.4  --  4.6 4.5  CL 100 103 106 107  --  109 111  CO2 21* 21* 21* 19*  --  19* 19*  GLUCOSE 116* 123* 106* 98  --  90 92  BUN 70* 59* 48* 47*  --  37* 42*  CREATININE 2.16* 1.79* 1.49* 1.54* 1.45* 1.40* 1.88*  CALCIUM 9.7 9.1 9.2 9.4  --  9.2 8.7*  MG 2.3  --  2.0  --   --   --   --     GFR: Estimated Creatinine Clearance: 31.2 mL/min (A) (by C-G formula based on SCr of 1.88 mg/dL (H)). Liver Function Tests: Recent Labs  Lab 02/19/21 1318 02/24/21 0357  AST 20 13*  ALT 24 15  ALKPHOS 108 80  BILITOT 1.1 0.8  PROT 6.8 5.1*  ALBUMIN 2.9* 2.1*    No results for input(s): LIPASE, AMYLASE in the last 168 hours. Recent Labs  Lab 02/19/21 1318  AMMONIA <9*    Coagulation Profile: Recent Labs  Lab 02/19/21 1318  INR 2.0*    Cardiac Enzymes: Recent Labs  Lab 02/19/21 1318  CKTOTAL 27*    BNP (last 3 results) No results for input(s): PROBNP in the last 8760 hours. HbA1C: No results for input(s): HGBA1C in the last 72 hours. CBG: No results for input(s): GLUCAP in the last 168 hours. Lipid Profile: No results for input(s): CHOL, HDL, LDLCALC, TRIG, CHOLHDL, LDLDIRECT in the last 72 hours. Thyroid Function Tests: No results for input(s): TSH, T4TOTAL, FREET4, T3FREE, THYROIDAB in the last 72 hours. Anemia Panel: No results for input(s): VITAMINB12, FOLATE, FERRITIN, TIBC, IRON, RETICCTPCT in the last 72 hours.  Urine analysis:    Component Value Date/Time   COLORURINE YELLOW 02/19/2021 1300   APPEARANCEUR CLEAR 02/19/2021 1300   APPEARANCEUR Clear 01/17/2021 1342   LABSPEC 1.012 02/19/2021 1300   PHURINE 5.0 02/19/2021 1300   GLUCOSEU NEGATIVE 02/19/2021 1300   HGBUR MODERATE (A) 02/19/2021 1300   BILIRUBINUR NEGATIVE 02/19/2021 1300   BILIRUBINUR Negative 01/17/2021 [place] 02/19/2021 1300   PROTEINUR NEGATIVE 02/19/2021 1300   NITRITE NEGATIVE 02/19/2021 [place] 02/19/2021 1300   Sepsis Labs: _0 (procalcitonin:4,lacticidven:4)  ) Recent Results (from the past 240 hour(s))  Wound culture     Status: Abnormal   Collection Time: 02/15/21  1:58 PM   Specimen: Wound  Result Value Ref Range Status   MICRO NUMBER: [id number]  Final   SPECIMEN QUALITY: Adequate  Final   SOURCE: KNEE, LEFT  Final   STATUS: FINAL  Final   GRAM STAIN:   Final    Many Polymorphonuclear leukocytes No organisms seen   ISOLATE 1: Pseudomonas aeruginosa (A)  Final    Comment: Heavy growth of Pseudomonas aeruginosa      Susceptibility   Pseudomonas aeruginosa - AEROBIC CULT, GRAM STAIN NEGATIVE 1    CEFTAZIDIME 4 Sensitive     CEFEPIME <=1 Sensitive     CIPROFLOXACIN 0.5 Sensitive     LEVOFLOXACIN 0.5 Sensitive  GENTAMICIN 2 Sensitive     IMIPENEM 1 Sensitive     PIP/TAZO <=4 Sensitive     TOBRAMYCIN* <=1 Sensitive      * Legend: S = Susceptible  I = Intermediate R = Resistant  NS = Not susceptible * = Not tested  NR = Not reported **NN = See antimicrobic comments   Urine Culture     Status: None   Collection Time: 02/19/21  1:00 PM   Specimen: Urine, Catheterized  Result Value Ref Range Status   Specimen Description   Final    URINE, CATHETERIZED Performed at [facility], [address] [place], [place] [address]    Special Requests   Final    NONE Performed at [facility], [address], [place], [place] [address]    Culture   Final    NO GROWTH Performed at [facility], [place] [address], [place], [place] [address]    Report Status 02/21/2021 FINAL  Final  Resp Panel by RT-PCR (Flu A&B, Covid) Nasopharyngeal Swab     Status: None   Collection Time: 02/19/21  2:29 PM   Specimen: Nasopharyngeal Swab; Nasopharyngeal(NP) swabs in vial transport medium  Result Value Ref Range Status   SARS Coronavirus 2 by RT PCR  NEGATIVE NEGATIVE Final    Comment: (NOTE) SARS-CoV-2 target nucleic acids are NOT DETECTED.  The SARS-CoV-2 RNA is generally detectable in upper respiratory specimens during the acute phase of infection. The lowest concentration of SARS-CoV-2 viral copies this assay can detect is 138 copies/mL. A negative result does not preclude SARS-Cov-2 infection and should not be used as the sole basis for treatment or other patient management decisions. A negative result may occur with  improper specimen collection/handling, submission of specimen other than nasopharyngeal swab, presence of viral mutation(s) within the areas targeted by this assay, and inadequate number of viral copies(<138 copies/mL). A negative result must be combined with clinical observations, patient history, and epidemiological information. The expected result is Negative.  Fact Sheet for Patients:  EntrepreneurPulse.com.au  Fact Sheet for Healthcare Providers:  IncredibleEmployment.be  This test is no t yet approved or cleared by the [place] FDA and  has been authorized for detection and/or diagnosis of SARS-CoV-2 by FDA under an Emergency Use Authorization (EUA). This EUA will remain  in effect (meaning this test can be used) for the duration of the COVID-19 declaration under Section 564(b)(1) of the Act, 21 U.S.C.section 360bbb-3(b)(1), unless the authorization is terminated  or revoked sooner.       Influenza A by PCR NEGATIVE NEGATIVE Final   Influenza B by PCR NEGATIVE NEGATIVE Final    Comment: (NOTE) The Xpert Xpress SARS-CoV-2/FLU/RSV plus assay is intended as an aid in the diagnosis of influenza from Nasopharyngeal swab specimens and should not be used as a sole basis for treatment. Nasal washings and aspirates are unacceptable for Xpert Xpress SARS-CoV-2/FLU/RSV testing.  Fact Sheet for Patients: EntrepreneurPulse.com.au  Fact Sheet for  Healthcare Providers: IncredibleEmployment.be  This test is not yet approved or cleared by the [place] FDA and has been authorized for detection and/or diagnosis of SARS-CoV-2 by FDA under an Emergency Use Authorization (EUA). This EUA will remain in effect (meaning this test can be used) for the duration of the COVID-19 declaration under Section 564(b)(1) of the Act, 21 U.S.C. section 360bbb-3(b)(1), unless the authorization is terminated or revoked.  Performed at [facility], [address], [place], [place] [address]   Blood Culture (routine x 2)  Status: None   Collection Time: 02/19/21  2:33 PM   Specimen: BLOOD RIGHT HAND  Result Value Ref Range Status   Specimen Description   Final    BLOOD RIGHT HAND BOTTLES DRAWN AEROBIC AND ANAEROBIC   Special Requests   Final    Blood Culture results may not be optimal due to an inadequate volume of blood received in culture bottles   Culture   Final    NO GROWTH 6 DAYS Performed at [facility], [address], [place], [place] [address]    Report Status 02/25/2021 FINAL  Final  Blood Culture (routine x 2)     Status: None   Collection Time: 02/19/21  2:33 PM   Specimen: BLOOD LEFT ARM  Result Value Ref Range Status   Specimen Description BLOOD LEFT ARM BOTTLES DRAWN AEROBIC AND ANAEROBIC  Final   Special Requests   Final    Blood Culture results may not be optimal due to an inadequate volume of blood received in culture bottles   Culture   Final    NO GROWTH 6 DAYS Performed at [facility], [address] [place], [place] [address]    Report Status 02/25/2021 FINAL  Final  Body fluid culture w Gram Stain     Status: None   Collection Time: 02/19/21  3:36 PM   Specimen: KNEE; Body Fluid  Result Value Ref Range Status   Specimen Description   Final    KNEE JOINT FLUID Performed at [facility], [address], [place], [place] [address]    Special Requests   Final    Normal Performed at [facility], [address], [place], [place] [address]    Gram Stain   Final    NO ORGANISMS SEEN WBC PRESENT, PREDOMINANTLY PMN Performed at [facility], [address] [place], [place] [address]    Culture   Final    FEW PSEUDOMONAS AERUGINOSA CRITICAL RESULT CALLED TO, READ BACK BY AND VERIFIED WITH: RN E.WRIGHT ON [id number] AT 2202 BY E.PARRISH Performed at [facility], [place] [address] [place], [place] [address]    Report Status 02/23/2021 FINAL  Final   Organism ID, Bacteria PSEUDOMONAS AERUGINOSA  Final      Susceptibility   Pseudomonas aeruginosa - MIC*    CEFTAZIDIME 2 SENSITIVE Sensitive     CIPROFLOXACIN <=0.25 SENSITIVE Sensitive     GENTAMICIN <=1 SENSITIVE Sensitive     IMIPENEM 1 SENSITIVE Sensitive     PIP/TAZO <=4 SENSITIVE Sensitive     CEFEPIME 2 SENSITIVE Sensitive     * FEW PSEUDOMONAS AERUGINOSA  Surgical PCR screen     Status: None   Collection Time: 02/23/21  2:36 AM   Specimen: Nasal Mucosa; Nasal Swab  Result Value Ref Range Status   MRSA, PCR NEGATIVE NEGATIVE Final   Staphylococcus aureus NEGATIVE NEGATIVE Final    Comment: (NOTE) The Xpert SA Assay (FDA approved for NASAL specimens in patients 78 years of age and older), is one component of a comprehensive surveillance program. It is not intended to diagnose infection nor to guide or monitor treatment. Performed at [facility], [address], [place], [place] [address]       Studies: No results found.  Scheduled Meds:  acetaminophen  650 mg Oral Q6H   allopurinol  100 mg Oral Daily   docusate sodium  100 mg Oral BID   enoxaparin (LOVENOX) injection  40 mg Subcutaneous Q24H   finasteride  5 mg Oral  Daily   furosemide  40 mg Oral Daily   irbesartan  150 mg Oral Daily   metoprolol succinate  50 mg Oral BID   pantoprazole  40 mg Oral QPM   tamsulosin  0.4 mg Oral BID    Continuous Infusions:  sodium chloride 60 mL/hr at 02/24/21 1511   ceFEPime (MAXIPIME) IV 2 g (02/25/21 0935)      LOS: 6 days     Annita Brod, MD [facility]   02/25/2021, 9:59 AM

## 2021-02-25 NOTE — Progress Notes (Signed)
Pharmacy Antibiotic Note  Philip Mcclain a 87 y.o. male admitted on 02/25/2021 with sepsis.  Pharmacy has been consulted for cefepime dosing. Culture + Pseudomonas Aeru, pan sensitive Knee washout on 6/22  Plan: Continue Cefepime 2gm IV every 12 hours. Monitor cxs, labs F/U transition to po abx  Medical History: Past Medical History:  Diagnosis Date   Acid reflux    Arthritis    Gout    Heart murmur    Hypertension    Kidney stones    Ruptured disc, cervical     Allergies:  No Known Allergies  Filed Weights   02/19/21 1248 02/23/21 1223  Weight: 79.8 kg (176 lb) 79.8 kg (175 lb 14.8 oz)    CBC Latest Ref Rng & Units 02/25/2021 02/24/2021 02/23/2021  WBC 4.0 - 10.5 K/uL 14.3(H) 17.1(H) 16.7(H)  Hemoglobin 13.0 - 17.0 g/dL 8.2(L) 9.2(L) 10.3(L)  Hematocrit 39.0 - 52.0 % 26.3(L) 29.1(L) 32.8(L)  Platelets 150 - 400 K/uL 295 351 362     Estimated Creatinine Clearance: 31.2 mL/min (A) (by C-G formula based on SCr of 1.88 mg/dL (H)).  Antibiotics Given (last 72 hours)     Date/Time Action Medication Dose Rate   02/22/21 2119 New Bag/Given   ceFEPIme (MAXIPIME) 2 g in sodium chloride 0.9 % 100 mL IVPB 2 g 200 mL/hr   02/23/21 0835 New Bag/Given   ceFEPIme (MAXIPIME) 2 g in sodium chloride 0.9 % 100 mL IVPB 2 g 200 mL/hr   02/23/21 2150 New Bag/Given   ceFEPIme (MAXIPIME) 2 g in sodium chloride 0.9 % 100 mL IVPB 2 g 200 mL/hr   02/24/21 [id number] New Bag/Given   ceFEPIme (MAXIPIME) 2 g in sodium chloride 0.9 % 100 mL IVPB 2 g 200 mL/hr   02/24/21 2030 New Bag/Given   ceFEPIme (MAXIPIME) 2 g in sodium chloride 0.9 % 100 mL IVPB 2 g 200 mL/hr   02/25/21 0935 New Bag/Given   ceFEPIme (MAXIPIME) 2 g in sodium chloride 0.9 % 100 mL IVPB 2 g 200 mL/hr       Antimicrobials this admission: Cefepime 02/19/2021  >>  vancomycin 02/19/2021  >> 6/19 Metronidazole 02/19/2021   x 1   Microbiology results: 02/19/2021  BCx: ngtd 02/19/2021  UCx: ng 6/18 Knee joint fluid: pseudomonas- pan  sensitive   Thank you for allowing pharmacy to be a part of this patient's care.  Isac Sarna, BS [place], BCPS Clinical Pharmacist Pager [phone]

## 2021-02-25 NOTE — Progress Notes (Signed)
All medications given under supervision of Leanne RN

## 2021-02-26 ENCOUNTER — Inpatient Hospital Stay (HOSPITAL_COMMUNITY): Payer: Medicare Other

## 2021-02-26 ENCOUNTER — Inpatient Hospital Stay: Payer: Self-pay

## 2021-02-26 LAB — CBC
HCT: 29.5 % — ABNORMAL LOW (ref 39.0–52.0)
Hemoglobin: 9.5 g/dL — ABNORMAL LOW (ref 13.0–17.0)
MCH: 31.5 pg (ref 26.0–34.0)
MCHC: 32.2 g/dL (ref 30.0–36.0)
MCV: 97.7 fL (ref 80.0–100.0)
Platelets: 312 10*3/uL (ref 150–400)
RBC: 3.02 MIL/uL — ABNORMAL LOW (ref 4.22–5.81)
RDW: 14.6 % (ref 11.5–15.5)
WBC: 14.2 10*3/uL — ABNORMAL HIGH (ref 4.0–10.5)
nRBC: 0 % (ref 0.0–0.2)

## 2021-02-26 LAB — BASIC METABOLIC PANEL
Anion gap: 6 (ref 5–15)
BUN: 40 mg/dL — ABNORMAL HIGH (ref 8–23)
CO2: 18 mmol/L — ABNORMAL LOW (ref 22–32)
Calcium: 9.1 mg/dL (ref 8.9–10.3)
Chloride: 109 mmol/L (ref 98–111)
Creatinine, Ser: 1.81 mg/dL — ABNORMAL HIGH (ref 0.61–1.24)
GFR, Estimated: 36 mL/min — ABNORMAL LOW (ref >60)
Glucose, Bld: 98 mg/dL (ref 70–99)
Potassium: 4.1 mmol/L (ref 3.5–5.1)
Sodium: 133 mmol/L — ABNORMAL LOW (ref 135–145)

## 2021-02-26 LAB — PROCALCITONIN: Procalcitonin: 0.51 ng/mL

## 2021-02-26 MED ORDER — OXYCODONE HCL 5 MG PO TABS
5.0000 mg | ORAL_TABLET | Freq: Four times a day (QID) | ORAL | Status: DC | PRN
  Administered 2021-02-26 (×2): 5 mg via ORAL
  Filled 2021-02-26 (×2): qty 1

## 2021-02-26 MED ORDER — SODIUM CHLORIDE 0.9% FLUSH
10.0000 mL | Freq: Two times a day (BID) | INTRAVENOUS | Status: DC
Start: 2021-02-26 — End: 2021-03-08
  Administered 2021-02-28: 10 mL
  Administered 2021-03-01: 20 mL
  Administered 2021-03-01 – 2021-03-07 (×13): 10 mL

## 2021-02-26 MED ORDER — APIXABAN 2.5 MG PO TABS
2.5000 mg | ORAL_TABLET | Freq: Two times a day (BID) | ORAL | Status: DC
  Administered 2021-02-26 – 2021-03-08 (×19): 2.5 mg via ORAL
  Filled 2021-02-26 (×19): qty 1

## 2021-02-26 MED ORDER — CHLORHEXIDINE GLUCONATE CLOTH 2 % EX PADS
6.0000 | MEDICATED_PAD | Freq: Every day | CUTANEOUS | Status: DC
  Administered 2021-02-26 – 2021-03-07 (×9): 6 via TOPICAL

## 2021-02-26 MED ORDER — SODIUM CHLORIDE 0.9% FLUSH: 10.0000 mL | INTRAVENOUS | Status: DC | PRN

## 2021-02-26 NOTE — Progress Notes (Signed)
ANTICOAGULATION CONSULT NOTE - Initial Consult  Pharmacy Consult for eliquis Indication: atrial fibrillation  No Known Allergies  Patient Measurements: Height: '5\' 11"'$  (180.3 cm) Weight: 79.8 kg (175 lb 14.8 oz) IBW/kg (Calculated) : 75.3  Vital Signs: Temp: 98.2 F (36.8 C) (06/25 0621) Temp Source: Oral (06/25 0621) BP: 128/48 (06/25 0621) Pulse Rate: 71 (06/25 0621)  Labs: Recent Labs    02/23/21 1729 02/24/21 0357 02/25/21 0502  HGB 10.3* 9.2* 8.2*  HCT 32.8* 29.1* 26.3*  PLT 362 351 295  CREATININE 1.45* 1.40* 1.88*    Estimated Creatinine Clearance: 31.2 mL/min (A) (by C-G formula based on SCr of 1.88 mg/dL (H)).   Medical History: Past Medical History:  Diagnosis Date   Acid reflux    Arthritis    Gout    Heart murmur    Hypertension    Kidney stones    Ruptured disc, cervical     Medications:  Medications Prior to Admission  Medication Sig Dispense Refill Last Dose   allopurinol (ZYLOPRIM) 100 MG tablet Take 1 tablet (100 mg total) by mouth daily. 30 tablet 5 02/19/2021   atorvastatin (LIPITOR) 40 MG tablet Take 1 tablet (40 mg total) by mouth daily. 30 tablet 0 02/19/2021   ELIQUIS 2.5 MG TABS tablet Take 2.5 mg by mouth 2 (two) times daily.   02/19/2021 at 0800   finasteride (PROSCAR) 5 MG tablet Take 1 tablet (5 mg total) by mouth daily. 90 tablet 3 02/19/2021   furosemide (LASIX) 40 MG tablet Take 40 mg by mouth daily.   02/19/2021   HYDROcodone-acetaminophen (NORCO/VICODIN) 5-325 MG tablet Take 1 tablet by mouth 2 (two) times daily as needed for pain.   02/19/2021   metoprolol succinate (TOPROL-XL) 50 MG 24 hr tablet Take 50 mg by mouth 2 (two) times daily.   02/19/2021 at 0800   Multiple Vitamins-Minerals (MULTIVITAMIN ADULTS 50+ PO) Take 1 tablet by mouth every evening.   02/19/2021   pantoprazole (PROTONIX) 40 MG tablet Take 40 mg by mouth every evening.   02/19/2021   potassium chloride (KLOR-CON) 10 MEQ tablet Take 10 mEq by mouth every evening.    02/19/2021   tamsulosin (FLOMAX) 0.4 MG CAPS capsule Take 0.4 mg by mouth 2 (two) times daily.   02/19/2021   valsartan (DIOVAN) 160 MG tablet Take 160 mg by mouth daily.   02/19/2021    Assessment: 87 year old with past medical history of pulmonary hypertension, persistent atrial fibrillation on Eliquis. Brought into the emergency room with a swollen left knee and confusion on 6/18 and found to be in severe sepsis from infected left knee joint. Plan is to resume eliquis per orthopedic surgery and hospitalist.  87 yo SCR 1.88 PTA dose eliquis 2.'5mg'$  po bid  Goal of Therapy:  Monitor platelets by anticoagulation protocol: Yes   Plan:  Eliquis 2.'5mg'$  po bid Monitor for S/S of bleeding  Isac Sarna, BS [place], BCPS Clinical Pharmacist Pager [phone] 02/26/2021,9:08 AM

## 2021-02-26 NOTE — Progress Notes (Deleted)
 PROGRESS NOTE  Philip Mcclain MRN:[id number] DOB: 11/24/1933 DOA: 02/19/2021 PCP: Vyas, Dhruv B, MD  HPI/Recap of past 24 hours: 86-year-old with past medical history of pulmonary hypertension, persistent atrial fibrillation on Eliquis Hache and hypertension seen by orthopedics in the outpatient setting and underwent left knee aspiration for steroid injection on June 14 with fluid findings consistent with gout, but also grew out Pseudomonas.  Brought into the emergency room with a swollen left knee and confusion on 6/18 and found to be in severe sepsis from infected left knee joint.  Patient started on IV fluids and antibiotics and admitted to the hospitalist service.  Orthopedics consulted and patient underwent left knee washout today, 6/22.  Today, patient complains of severe left knee pain.  No real other complaints.  Assessment/Plan: Active Problems:   Pulmonary HTN (HCC): Clinically compensated.    Essential hypertension: Blood pressures have been trending upward.  Have restarted patient's Lasix.  Has responded with noteworthy diuresis (>8 L)    Persistent atrial fibrillation (HCC): Rate controlled.  Resume Eliquis as per orthopedic surgery  Severe pseudomonal sepsis from septic joint of left knee joint (HCC): Patient met criteria for severe sepsis given tachycardia, tachypnea, leukocytosis with acute kidney injury, lactic acidosis, present on admission.  Sepsis stabilized.  Lactic acid now normalized.  Going for knee washout today.  Continue antibiotics.  Cultures + for pan-sensitive Pseudomonas. Gave Toradol x1 for knee pain in addition to his other pain medications.    BPH with obstruction/lower urinary tract symptoms    Toxic metabolic encephalopathy: Due to sepsis.  Improved with fluids and antibiotics.  Acute hyponatremia: Secondary to hypovolemia.  Much improved with fluids   Code Status: DNR  Family Communication: Left message for family  Disposition Plan: Suspect he may  need skilled nursing.  To be determined following knee stabilization   Consultants: Orthopedic surgery  Procedures: Left knee washout 6/22  Antimicrobials: Vancomycin and Flagyl 6/18 only Cefepime 6/18-present  DVT prophylaxis: SCDs.  Eliquis on hold  Level of care: Telemetry   Objective: Vitals:   02/25/21 2045 02/26/21 0621  BP: (!) 140/57 (!) 128/48  Pulse: 64 71  Resp: 18 18  Temp: 98.2 F (36.8 C) 98.2 F (36.8 C)  SpO2: 100% 100%    Intake/Output Summary (Last 24 hours) at 02/26/2021 0812 Last data filed at 02/26/2021 0700 Gross per 24 hour  Intake 480 ml  Output 750 ml  Net -270 ml    Filed Weights   02/19/21 1248 02/23/21 1223  Weight: 79.8 kg 79.8 kg   Body mass index is 24.54 kg/m.  Exam:  General: Alert and oriented x2, moderate distress secondary to knee pain Cardiovascular: Regular rate and rhythm, S1-S2 Respiratory: Clear to auscultation bilaterally Abdomen: Soft, nontender, nondistended, positive bowel sounds Musculoskeletal: No clubbing or cyanosis, trace pitting edema.  Left knee bandaged, tender, swollen Skin: No skin breaks, tears or lesions Psychiatry: Appropriate, no evidence of psychoses Neurology: No focal deficits   Data Reviewed: CBC: Recent Labs  Lab 02/19/21 1318 02/20/21 0618 02/21/21 0557 02/22/21 0600 02/23/21 1729 02/24/21 0357 02/25/21 0502  WBC 22.1* 18.8* 16.5* 15.0* 16.7* 17.1* 14.3*  NEUTROABS 20.0* 16.6*  --   --   --   --   --   HGB 11.7* 10.5* 8.9* 9.2* 10.3* 9.2* 8.2*  HCT 36.0* 32.9* 27.5* 29.3* 32.8* 29.1* 26.3*  MCV 96.3 98.2 98.6 99.3 99.4 100.3* 100.4*  PLT 493* 456* 381 379 362 351 295    Basic Metabolic   Panel: Recent Labs  Lab 02/19/21 1318 02/20/21 0618 02/21/21 0557 02/22/21 0600 02/23/21 1729 02/24/21 0357 02/25/21 0502  NA 132* 131* 133* 133*  --  134* 133*  K 4.8 4.5 4.4 4.4  --  4.6 4.5  CL 100 103 106 107  --  109 111  CO2 21* 21* 21* 19*  --  19* 19*  GLUCOSE 116* 123* 106*  98  --  90 92  BUN 70* 59* 48* 47*  --  37* 42*  CREATININE 2.16* 1.79* 1.49* 1.54* 1.45* 1.40* 1.88*  CALCIUM 9.7 9.1 9.2 9.4  --  9.2 8.7*  MG 2.3  --  2.0  --   --   --   --     GFR: Estimated Creatinine Clearance: 31.2 mL/min (A) (by C-G formula based on SCr of 1.88 mg/dL (H)). Liver Function Tests: Recent Labs  Lab 02/19/21 1318 02/24/21 0357  AST 20 13*  ALT 24 15  ALKPHOS 108 80  BILITOT 1.1 0.8  PROT 6.8 5.1*  ALBUMIN 2.9* 2.1*    No results for input(s): LIPASE, AMYLASE in the last 168 hours. Recent Labs  Lab 02/19/21 1318  AMMONIA <9*    Coagulation Profile: Recent Labs  Lab 02/19/21 1318  INR 2.0*    Cardiac Enzymes: Recent Labs  Lab 02/19/21 1318  CKTOTAL 27*    BNP (last 3 results) No results for input(s): PROBNP in the last 8760 hours. HbA1C: No results for input(s): HGBA1C in the last 72 hours. CBG: No results for input(s): GLUCAP in the last 168 hours. Lipid Profile: No results for input(s): CHOL, HDL, LDLCALC, TRIG, CHOLHDL, LDLDIRECT in the last 72 hours. Thyroid Function Tests: No results for input(s): TSH, T4TOTAL, FREET4, T3FREE, THYROIDAB in the last 72 hours. Anemia Panel: No results for input(s): VITAMINB12, FOLATE, FERRITIN, TIBC, IRON, RETICCTPCT in the last 72 hours.  Urine analysis:    Component Value Date/Time   COLORURINE YELLOW 02/19/2021 1300   APPEARANCEUR CLEAR 02/19/2021 1300   APPEARANCEUR Clear 01/17/2021 1342   LABSPEC 1.012 02/19/2021 1300   PHURINE 5.0 02/19/2021 1300   GLUCOSEU NEGATIVE 02/19/2021 1300   HGBUR MODERATE (A) 02/19/2021 1300   BILIRUBINUR NEGATIVE 02/19/2021 1300   BILIRUBINUR Negative 01/17/2021 1342   KETONESUR NEGATIVE 02/19/2021 1300   PROTEINUR NEGATIVE 02/19/2021 1300   NITRITE NEGATIVE 02/19/2021 1300   LEUKOCYTESUR NEGATIVE 02/19/2021 1300   Sepsis Labs: @LABRCNTIP(procalcitonin:4,lacticidven:4)  ) Recent Results (from the past 240 hour(s))  Urine Culture     Status: None    Collection Time: 02/19/21  1:00 PM   Specimen: Urine, Catheterized  Result Value Ref Range Status   Specimen Description   Final    URINE, CATHETERIZED Performed at [facility], [address] [place], [place] [address]    Special Requests   Final    NONE Performed at [facility], [address] [place], [place] [address]    Culture   Final    NO GROWTH Performed at [facility], [address] [place], [place] [address]    Report Status 02/21/2021 FINAL  Final  Resp Panel by RT-PCR (Flu A&B, Covid) Nasopharyngeal Swab     Status: None   Collection Time: 02/19/21  2:29 PM   Specimen: Nasopharyngeal Swab; Nasopharyngeal(NP) swabs in vial transport medium  Result Value Ref Range Status   SARS Coronavirus 2 by RT PCR NEGATIVE NEGATIVE Final    Comment: (NOTE) SARS-CoV-2 target nucleic acids are NOT DETECTED.  The SARS-CoV-2 RNA   is generally detectable in upper respiratory specimens during the acute phase of infection. The lowest concentration of SARS-CoV-2 viral copies this assay can detect is 138 copies/mL. A negative result does not preclude SARS-Cov-2 infection and should not be used as the sole basis for treatment or other patient management decisions. A negative result may occur with  improper specimen collection/handling, submission of specimen other than nasopharyngeal swab, presence of viral mutation(s) within the areas targeted by this assay, and inadequate number of viral copies(<138 copies/mL). A negative result must be combined with clinical observations, patient history, and epidemiological information. The expected result is Negative.  Fact Sheet for Patients:  EntrepreneurPulse.com.au  Fact Sheet for Healthcare Providers:  IncredibleEmployment.be  This test is no t yet approved or cleared by the [place] FDA and  has been authorized for detection and/or diagnosis of SARS-CoV-2 by FDA under an Emergency Use  Authorization (EUA). This EUA will remain  in effect (meaning this test can be used) for the duration of the COVID-19 declaration under Section 564(b)(1) of the Act, 21 U.S.C.section 360bbb-3(b)(1), unless the authorization is terminated  or revoked sooner.       Influenza A by PCR NEGATIVE NEGATIVE Final   Influenza B by PCR NEGATIVE NEGATIVE Final    Comment: (NOTE) The Xpert Xpress SARS-CoV-2/FLU/RSV plus assay is intended as an aid in the diagnosis of influenza from Nasopharyngeal swab specimens and should not be used as a sole basis for treatment. Nasal washings and aspirates are unacceptable for Xpert Xpress SARS-CoV-2/FLU/RSV testing.  Fact Sheet for Patients: EntrepreneurPulse.com.au  Fact Sheet for Healthcare Providers: IncredibleEmployment.be  This test is not yet approved or cleared by the [place] FDA and has been authorized for detection and/or diagnosis of SARS-CoV-2 by FDA under an Emergency Use Authorization (EUA). This EUA will remain in effect (meaning this test can be used) for the duration of the COVID-19 declaration under Section 564(b)(1) of the Act, 21 U.S.C. section 360bbb-3(b)(1), unless the authorization is terminated or revoked.  Performed at [facility], [address] [place], [place] [address]   Blood Culture (routine x 2)     Status: None   Collection Time: 02/19/21  2:33 PM   Specimen: BLOOD RIGHT HAND  Result Value Ref Range Status   Specimen Description   Final    BLOOD RIGHT HAND BOTTLES DRAWN AEROBIC AND ANAEROBIC   Special Requests   Final    Blood Culture results may not be optimal due to an inadequate volume of blood received in culture bottles   Culture   Final    NO GROWTH 6 DAYS Performed at [facility], [address], [place], [place] [address]    Report Status 02/25/2021 FINAL  Final  Blood Culture (routine x 2)     Status: None   Collection Time: 02/19/21  2:33 PM   Specimen: BLOOD  LEFT ARM  Result Value Ref Range Status   Specimen Description BLOOD LEFT ARM BOTTLES DRAWN AEROBIC AND ANAEROBIC  Final   Special Requests   Final    Blood Culture results may not be optimal due to an inadequate volume of blood received in culture bottles   Culture   Final    NO GROWTH 6 DAYS Performed at [facility], [address], [place], [place] [address]    Report Status 02/25/2021 FINAL  Final  Body fluid culture w Gram Stain     Status: None   Collection Time: 02/19/21  3:36 PM   Specimen: KNEE;  Body Fluid  Result Value Ref Range Status   Specimen Description   Final    KNEE JOINT FLUID Performed at [facility], [address] [place], [place] [address]    Special Requests   Final    Normal Performed at [facility], [address] [place], [place] [address]    Gram Stain   Final    NO ORGANISMS SEEN WBC PRESENT, PREDOMINANTLY PMN Performed at [facility], [address] [place], [place] [address]    Culture   Final    FEW PSEUDOMONAS AERUGINOSA CRITICAL RESULT CALLED TO, READ BACK BY AND VERIFIED WITH: RN E.WRIGHT ON [id number] AT 1255 BY E.PARRISH Performed at [facility], [address] [place], [place] [address]    Report Status 02/23/2021 FINAL  Final   Organism ID, Bacteria PSEUDOMONAS AERUGINOSA  Final      Susceptibility   Pseudomonas aeruginosa - MIC*    CEFTAZIDIME 2 SENSITIVE Sensitive     CIPROFLOXACIN <=0.25 SENSITIVE Sensitive     GENTAMICIN <=1 SENSITIVE Sensitive     IMIPENEM 1 SENSITIVE Sensitive     PIP/TAZO <=4 SENSITIVE Sensitive     CEFEPIME 2 SENSITIVE Sensitive     * FEW PSEUDOMONAS AERUGINOSA  Surgical PCR screen     Status: None   Collection Time: 02/23/21  2:36 AM   Specimen: Nasal Mucosa; Nasal Swab  Result Value Ref Range Status   MRSA, PCR NEGATIVE NEGATIVE Final   Staphylococcus aureus NEGATIVE NEGATIVE Final    Comment: (NOTE) The Xpert SA Assay (FDA approved for NASAL specimens in patients 22 years of age and older),  is one component of a comprehensive surveillance program. It is not intended to diagnose infection nor to guide or monitor treatment. Performed at [facility], [address] [place], [place] [address]       Studies: No results found.  Scheduled Meds:  acetaminophen  650 mg Oral Q6H   allopurinol  100 mg Oral Daily   docusate sodium  100 mg Oral BID   enoxaparin (LOVENOX) injection  40 mg Subcutaneous Q24H   finasteride  5 mg Oral Daily   furosemide  40 mg Oral Daily   irbesartan  150 mg Oral Daily   metoprolol succinate  50 mg Oral BID   pantoprazole  40 mg Oral QPM   tamsulosin  0.4 mg Oral BID    Continuous Infusions:  sodium chloride 60 mL/hr at 02/24/21 1511   ceFEPime (MAXIPIME) IV 2 g (02/25/21 2238)     LOS: 7 days      K , MD [facility]   02/26/2021, 8:12 AM   

## 2021-02-26 NOTE — Consult Note (Signed)
Virtual consult note   Cc: septic native knee; PsA Primary physician: Gevena Barre, MD   HPI is via chart review; per dr Lyman Speller note 68/52 "87 year old with past medical history of pulmonary hypertension, persistent atrial fibrillation on Eliquis Hache and hypertension seen by orthopedics in the outpatient setting and underwent left knee aspiration for steroid injection on June 14 with fluid findings consistent with gout, but also grew out Pseudomonas.  Brought into the emergency room with a swollen left knee and confusion on 6/18 and found to be in severe sepsis from infected left knee joint.  Patient started on IV fluids and antibiotics and admitted to the hospitalist service.  Orthopedics consulted and patient underwent left knee washout on 6/22.  Since then was having some severe knee pain over the next 24 hours and on night of 6/23, developed upper extremity tremors"  Patient with ckd 4; gfr in 73s; he is currently on cefepime 2 gram q12  Unclear (unusual) to have pseudomonas septic native knee  Reviewed labs/vitals/imaging 6/18 xray knee 1. Findings suspicious for a non-depressed lateral tibial plateau fracture. 2. Large knee joint effusion, likely hemarthrosis. 3. Severe tricompartmental osteoarthritis of the left knee.  Culture: 6/18 Aspirate knee cx PsA (S cipro/cefepime/pip-tazo) 6/18 bcx negative   A/p Left native knee joint septic arthritis with psA Gout Ckd   S/p arthroscopic lavage left knee 6/22 Duration septic knee would be 4 weeks. Or 6 weeks if bone involved.  With pseudomonas bone/joint infection, would give IV at least 2 weeks prior to consideration of changing to oral  Will need to be seen in ID clinic for determination if abx needs to be extended   -continue cefepime 2 gram iv q12 adjusted for ckd -duration at least 4 weeks 6/24-7/21 -I have placed OPAT order with above abx/duration -if home health is setup, he will need weekly cbc, cmp, crp while  on iv abx -if going to snf, labs to be drawn at snf and fax to [facility]  OPAT Order Details             .Outpatient Parenteral Antibiotic Therapy Consult  Until discontinued       Comments: Weekly cbc, cmp, crp to be faxed to id clinic  Provider:  (Not yet assigned)  Question Answer Comment  Antibiotic Cefepime (Maxipime) IVPB   Indications for use septic arthritis   End Date 03/24/2021                  OPAT Orders Discharge antibiotics to be given via PICC line [facility] Care Per Protocol: Home health RN for IV administration and teaching; PICC line care and labs.    Labs weekly while on IV antibiotics: _x_ CBC with differential __ BMP _x_ CMP _x_ CRP __ ESR __ Vancomycin trough __ CK  _x_ Please pull PIC at completion of IV antibiotics __ Please leave PIC in place until doctor has seen patient or been notified  Fax weekly labs to [phone]  Clinic Follow Up Appt: Dr Scharlene Gloss, on 7/13 @ 230 pm  @  [facility] [place], [place], [place] [address] Phone: [phone]

## 2021-02-26 NOTE — Progress Notes (Signed)
Peripherally Inserted Central Catheter Placement  The IV Nurse has discussed with the patient and/or persons authorized to consent for the patient, the purpose of this procedure and the potential benefits and risks involved with this procedure.  The benefits include less needle sticks, lab draws from the catheter, and the patient may be discharged home with the catheter. Risks include, but not limited to, infection, bleeding, blood clot (thrombus formation), and puncture of an artery; nerve damage and irregular heartbeat and possibility to perform a PICC exchange if needed/ordered by physician.  Alternatives to this procedure were also discussed.  Bard Power PICC patient education guide, fact sheet on infection prevention and patient information card has been provided to patient /or left at bedside. Consent obtained from daughter Sharyn Lull) at bedside.  PICC Placement Documentation  PICC Single Lumen 0000000 Right Basilic 40 cm 0 cm (Active)  Indication for Insertion or Continuance of Line Home intravenous therapies (PICC only);Prolonged intravenous therapies 02/26/21 1420  Exposed Catheter (cm) 0 cm 02/26/21 1420  Site Assessment Clean;Dry;Intact 02/26/21 1420  Line Status Flushed;Saline locked;Blood return noted 02/26/21 1420  Dressing Type Transparent;Securing device 02/26/21 1420  Dressing Status Clean;Dry;Intact 02/26/21 1420  Antimicrobial disc in place? Yes 02/26/21 1420  Safety Lock Not Applicable [id number]  Line Care Connections checked and tightened 02/26/21 1420  Dressing Intervention New dressing 02/26/21 1420  Dressing Change Due 03/05/21 02/26/21 Vivian 02/26/2021, 2:41 PM

## 2021-02-26 NOTE — Progress Notes (Signed)
PROGRESS NOTE  Philip Mcclain KGU:[id number] DOB: May 31, 1934 DOA: 02/19/2021 PCP: Glenda Chroman, MD  HPI/Recap of past 62 hours: 87 year old with past medical history of pulmonary hypertension, persistent atrial fibrillation on Eliquis [place] and hypertension seen by orthopedics in the outpatient setting and underwent left knee aspiration for steroid injection on June 14 with fluid findings consistent with gout, but also grew out Pseudomonas.  Brought into the emergency room with a swollen left knee and confusion on 6/18 and found to be in severe sepsis from infected left knee joint.  Patient started on IV fluids and antibiotics and admitted to the hospitalist service.  Orthopedics consulted and patient underwent left knee washout on 6/22.  Since then was having some severe knee pain over the next 24 hours and on night of 6/23, developed upper extremity tremors  Since then, knee pain and tremors have both significantly improved.  Patient with no complaints.  Hospital course:   Pulmonary HTN [facility]): Clinically compensated.    Essential hypertension: Blood pressures have been trending upward.  Have restarted patient's Lasix.  Has responded with significant diuresis and has diuresed almost  8 L since hospitalization    Persistent atrial fibrillation [facility]): Rate controlled.  Resume Eliquis as per orthopedic surgery  Severe pseudomonal sepsis from septic joint of left knee joint [facility]): Patient met criteria for severe sepsis given tachycardia, tachypnea, leukocytosis with acute kidney injury, lactic acidosis, present on admission.  Sepsis stabilized.  Lactic acid now normalized.  Going for knee washout today.  Continue antibiotics.  Previous cultures grew out Pseudomonas and cultures from washout grew out sensitive Pseudomonas added Toradol x1 for knee pain in addition to his other pain medications.  Discussed with infectious disease who recommend 4 weeks of IV antibiotics.  Will place PICC line and plan for  cefepime 2 g twice a day  Tremors: Unclear etiology.  From morphine?  Slowly improving    BPH with obstruction/lower urinary tract symptoms    Toxic metabolic encephalopathy: Due to sepsis.  Improved with fluids and antibiotics.  Acute hyponatremia: Secondary to hypovolemia.  Much improved with fluids   Code Status: DNR  Family Communication: Left message for family  Disposition Plan: Patient does not want to go to skilled nursing.  Home with home health.  Home in the next 24-48 hours following PICC line placement and set up home health IV antibiotics  Consultants: Orthopedic surgery  Procedures: Left knee washout 6/22  Antimicrobials: Vancomycin and Flagyl 6/18 only Cefepime 6/18-7/16  DVT prophylaxis: Will resume Eliquis  Level of care: Telemetry   Objective: Vitals:   02/25/21 2045 02/26/21 0621  BP: (!) 140/57 (!) 128/48  Pulse: 64 71  Resp: 18 18  Temp: 98.2 F (36.8 C) 98.2 F (36.8 C)  SpO2: 100% 100%    Intake/Output Summary (Last 24 hours) at 02/26/2021 0824 Last data filed at 02/26/2021 0700 Gross per 24 hour  Intake 480 ml  Output 750 ml  Net -270 ml    Filed Weights   02/19/21 1248 02/23/21 1223  Weight: 79.8 kg 79.8 kg   Body mass index is 24.54 kg/m.  Exam:  General: Alert and oriented x2, no acute distress Cardiovascular: Regular rate and rhythm, S1-S2 Respiratory: Clear to auscultation bilaterally Abdomen: Soft, nontender, nondistended, positive bowel sounds Musculoskeletal: No clubbing or cyanosis, trace pitting edema.  Left knee bandaged, tender, swollen Skin: No skin breaks, tears or lesions Psychiatry: Appropriate, no evidence of psychoses Neurology: No focal deficits   Data Reviewed:  CBC: Recent Labs  Lab 02/19/21 1318 02/20/21 0618 02/21/21 0557 02/22/21 0600 02/23/21 1729 02/24/21 0357 02/25/21 0502  WBC 22.1* 18.8* 16.5* 15.0* 16.7* 17.1* 14.3*  NEUTROABS 20.0* 16.6*  --   --   --   --   --   HGB 11.7* 10.5*  8.9* 9.2* 10.3* 9.2* 8.2*  HCT 36.0* 32.9* 27.5* 29.3* 32.8* 29.1* 26.3*  MCV 96.3 98.2 98.6 99.3 99.4 100.3* 100.4*  PLT 493* 456* 381 379 362 351 578    Basic Metabolic Panel: Recent Labs  Lab 02/19/21 1318 02/20/21 0618 02/21/21 0557 02/22/21 0600 02/23/21 1729 02/24/21 0357 02/25/21 0502  NA 132* 131* 133* 133*  --  134* 133*  K 4.8 4.5 4.4 4.4  --  4.6 4.5  CL 100 103 106 107  --  109 111  CO2 21* 21* 21* 19*  --  19* 19*  GLUCOSE 116* 123* 106* 98  --  90 92  BUN 70* 59* 48* 47*  --  37* 42*  CREATININE 2.16* 1.79* 1.49* 1.54* 1.45* 1.40* 1.88*  CALCIUM 9.7 9.1 9.2 9.4  --  9.2 8.7*  MG 2.3  --  2.0  --   --   --   --     GFR: Estimated Creatinine Clearance: 31.2 mL/min (A) (by C-G formula based on SCr of 1.88 mg/dL (H)). Liver Function Tests: Recent Labs  Lab 02/19/21 1318 02/24/21 0357  AST 20 13*  ALT 24 15  ALKPHOS 108 80  BILITOT 1.1 0.8  PROT 6.8 5.1*  ALBUMIN 2.9* 2.1*    No results for input(s): LIPASE, AMYLASE in the last 168 hours. Recent Labs  Lab 02/19/21 1318  AMMONIA <9*    Coagulation Profile: Recent Labs  Lab 02/19/21 1318  INR 2.0*    Cardiac Enzymes: Recent Labs  Lab 02/19/21 1318  CKTOTAL 27*    BNP (last 3 results) No results for input(s): PROBNP in the last 8760 hours. HbA1C: No results for input(s): HGBA1C in the last 72 hours. CBG: No results for input(s): GLUCAP in the last 168 hours. Lipid Profile: No results for input(s): CHOL, HDL, LDLCALC, TRIG, CHOLHDL, LDLDIRECT in the last 72 hours. Thyroid Function Tests: No results for input(s): TSH, T4TOTAL, FREET4, T3FREE, THYROIDAB in the last 72 hours. Anemia Panel: No results for input(s): VITAMINB12, FOLATE, FERRITIN, TIBC, IRON, RETICCTPCT in the last 72 hours.  Urine analysis:    Component Value Date/Time   COLORURINE YELLOW 02/19/2021 1300   APPEARANCEUR CLEAR 02/19/2021 1300   APPEARANCEUR Clear 01/17/2021 1342   LABSPEC 1.012 02/19/2021 1300   PHURINE  5.0 02/19/2021 1300   GLUCOSEU NEGATIVE 02/19/2021 1300   HGBUR MODERATE (A) 02/19/2021 1300   BILIRUBINUR NEGATIVE 02/19/2021 1300   BILIRUBINUR Negative 01/17/2021 [place] 02/19/2021 1300   PROTEINUR NEGATIVE 02/19/2021 1300   NITRITE NEGATIVE 02/19/2021 [place] 02/19/2021 1300   Sepsis Labs: $[email or web address]'[id number]$ (procalcitonin:4,lacticidven:4)  ) Recent Results (from the past 240 hour(s))  Urine Culture     Status: None   Collection Time: 02/19/21  1:00 PM   Specimen: Urine, Catheterized  Result Value Ref Range Status   Specimen Description   Final    URINE, CATHETERIZED Performed at [facility], [address], [place], [place] [address]    Special Requests   Final    NONE Performed at [facility] [address] [place], [place] [address]    Culture   Final    NO GROWTH Performed at  [facility], [place] [address], [place], [place] [address]    Report Status 02/21/2021 FINAL  Final  Resp Panel by RT-PCR (Flu A&B, Covid) Nasopharyngeal Swab     Status: None   Collection Time: 02/19/21  2:29 PM   Specimen: Nasopharyngeal Swab; Nasopharyngeal(NP) swabs in vial transport medium  Result Value Ref Range Status   SARS Coronavirus 2 by RT PCR NEGATIVE NEGATIVE Final    Comment: (NOTE) SARS-CoV-2 target nucleic acids are NOT DETECTED.  The SARS-CoV-2 RNA is generally detectable in upper respiratory specimens during the acute phase of infection. The lowest concentration of SARS-CoV-2 viral copies this assay can detect is 138 copies/mL. A negative result does not preclude SARS-Cov-2 infection and should not be used as the sole basis for treatment or other patient management decisions. A negative result may occur with  improper specimen collection/handling, submission of specimen other than nasopharyngeal swab, presence of viral mutation(s) within the areas targeted by this assay, and inadequate number of viral copies(<138 copies/mL). A  negative result must be combined with clinical observations, patient history, and epidemiological information. The expected result is Negative.  Fact Sheet for Patients:  EntrepreneurPulse.com.au  Fact Sheet for Healthcare Providers:  IncredibleEmployment.be  This test is no t yet approved or cleared by the [place] FDA and  has been authorized for detection and/or diagnosis of SARS-CoV-2 by FDA under an Emergency Use Authorization (EUA). This EUA will remain  in effect (meaning this test can be used) for the duration of the COVID-19 declaration under Section 564(b)(1) of the Act, 21 U.S.C.section 360bbb-3(b)(1), unless the authorization is terminated  or revoked sooner.       Influenza A by PCR NEGATIVE NEGATIVE Final   Influenza B by PCR NEGATIVE NEGATIVE Final    Comment: (NOTE) The Xpert Xpress SARS-CoV-2/FLU/RSV plus assay is intended as an aid in the diagnosis of influenza from Nasopharyngeal swab specimens and should not be used as a sole basis for treatment. Nasal washings and aspirates are unacceptable for Xpert Xpress SARS-CoV-2/FLU/RSV testing.  Fact Sheet for Patients: EntrepreneurPulse.com.au  Fact Sheet for Healthcare Providers: IncredibleEmployment.be  This test is not yet approved or cleared by the [place] FDA and has been authorized for detection and/or diagnosis of SARS-CoV-2 by FDA under an Emergency Use Authorization (EUA). This EUA will remain in effect (meaning this test can be used) for the duration of the COVID-19 declaration under Section 564(b)(1) of the Act, 21 U.S.C. section 360bbb-3(b)(1), unless the authorization is terminated or revoked.  Performed at [facility], [address] [place], [place] [address]   Blood Culture (routine x 2)     Status: None   Collection Time: 02/19/21  2:33 PM   Specimen: BLOOD RIGHT HAND  Result Value Ref Range Status   Specimen  Description   Final    BLOOD RIGHT HAND BOTTLES DRAWN AEROBIC AND ANAEROBIC   Special Requests   Final    Blood Culture results may not be optimal due to an inadequate volume of blood received in culture bottles   Culture   Final    NO GROWTH 6 DAYS Performed at [facility], [address] [place], [place] [address]    Report Status 02/25/2021 FINAL  Final  Blood Culture (routine x 2)     Status: None   Collection Time: 02/19/21  2:33 PM   Specimen: BLOOD LEFT ARM  Result Value Ref Range Status   Specimen Description BLOOD LEFT ARM BOTTLES DRAWN AEROBIC AND ANAEROBIC  Final   Special Requests   Final    Blood Culture results may not be optimal due to an inadequate volume of blood received in culture bottles   Culture   Final    NO GROWTH 6 DAYS Performed at [facility], [address], [place], [place] [address]    Report Status 02/25/2021 FINAL  Final  Body fluid culture w Gram Stain     Status: None   Collection Time: 02/19/21  3:36 PM   Specimen: KNEE; Body Fluid  Result Value Ref Range Status   Specimen Description   Final    KNEE JOINT FLUID Performed at [facility], [address], [place], [place] [address]    Special Requests   Final    Normal Performed at [facility] [address] [place], [place] [address]    Gram Stain   Final    NO ORGANISMS SEEN WBC PRESENT, PREDOMINANTLY PMN Performed at [facility], [address], [place], [place] [address]    Culture   Final    FEW PSEUDOMONAS AERUGINOSA CRITICAL RESULT CALLED TO, READ BACK BY AND VERIFIED WITH: RN E.WRIGHT ON [id number] AT [id number] BY E.PARRISH Performed at [facility], [place] [address], [place], [place] [address]    Report Status 02/23/2021 FINAL  Final   Organism ID, Bacteria PSEUDOMONAS AERUGINOSA  Final      Susceptibility   Pseudomonas aeruginosa - MIC*    CEFTAZIDIME 2 SENSITIVE Sensitive     CIPROFLOXACIN <=0.25 SENSITIVE Sensitive     GENTAMICIN <=1 SENSITIVE Sensitive     IMIPENEM 1  SENSITIVE Sensitive     PIP/TAZO <=4 SENSITIVE Sensitive     CEFEPIME 2 SENSITIVE Sensitive     * FEW PSEUDOMONAS AERUGINOSA  Surgical PCR screen     Status: None   Collection Time: 02/23/21  2:36 AM   Specimen: Nasal Mucosa; Nasal Swab  Result Value Ref Range Status   MRSA, PCR NEGATIVE NEGATIVE Final   Staphylococcus aureus NEGATIVE NEGATIVE Final    Comment: (NOTE) The Xpert SA Assay (FDA approved for NASAL specimens in patients 49 years of age and older), is one component of a comprehensive surveillance program. It is not intended to diagnose infection nor to guide or monitor treatment. Performed at [facility], [address] [place], [place] [address]       Studies: [place] EKG SITE RITE  Result Date: 02/26/2021 If [facility] image not attached, placement could not be confirmed due to current cardiac rhythm.   Scheduled Meds:  acetaminophen  650 mg Oral Q6H   allopurinol  100 mg Oral Daily   docusate sodium  100 mg Oral BID   enoxaparin (LOVENOX) injection  40 mg Subcutaneous Q24H   finasteride  5 mg Oral Daily   furosemide  40 mg Oral Daily   irbesartan  150 mg Oral Daily   metoprolol succinate  50 mg Oral BID   pantoprazole  40 mg Oral QPM   tamsulosin  0.4 mg Oral BID    Continuous Infusions:  sodium chloride 60 mL/hr at 02/24/21 1511   ceFEPime (MAXIPIME) IV 2 g (02/25/21 2238)     LOS: 7 days     Annita Brod, MD [facility]   02/26/2021, 8:24 AM

## 2021-02-26 NOTE — Progress Notes (Addendum)
PHARMACY CONSULT NOTE FOR:  OUTPATIENT  PARENTERAL ANTIBIOTIC THERAPY (OPAT)  Indication: Septic arthritis Regimen: Cefepime 2gm IV q24h- adjusted 7/1 for worsening renal function End date: 03/24/21  IV antibiotic discharge orders are pended. To discharging provider:  please sign these orders via discharge navigator,  Select New Orders & click on the button choice - Manage This Unsigned Work.     Thank you for allowing pharmacy to be a part of this patient's care.  Margot Ables, PharmD Clinical Pharmacist 03/04/2021 10:57 AM

## 2021-02-27 DIAGNOSIS — G9341 Metabolic encephalopathy: Secondary | ICD-10-CM

## 2021-02-27 LAB — BASIC METABOLIC PANEL
Anion gap: 8 (ref 5–15)
BUN: 39 mg/dL — ABNORMAL HIGH (ref 8–23)
CO2: 18 mmol/L — ABNORMAL LOW (ref 22–32)
Calcium: 8.9 mg/dL (ref 8.9–10.3)
Chloride: 107 mmol/L (ref 98–111)
Creatinine, Ser: 1.79 mg/dL — ABNORMAL HIGH (ref 0.61–1.24)
GFR, Estimated: 37 mL/min — ABNORMAL LOW (ref >60)
Glucose, Bld: 108 mg/dL — ABNORMAL HIGH (ref 70–99)
Potassium: 4.1 mmol/L (ref 3.5–5.1)
Sodium: 133 mmol/L — ABNORMAL LOW (ref 135–145)

## 2021-02-27 LAB — CBC
HCT: 29.6 % — ABNORMAL LOW (ref 39.0–52.0)
Hemoglobin: 9.5 g/dL — ABNORMAL LOW (ref 13.0–17.0)
MCH: 31 pg (ref 26.0–34.0)
MCHC: 32.1 g/dL (ref 30.0–36.0)
MCV: 96.7 fL (ref 80.0–100.0)
Platelets: 335 10*3/uL (ref 150–400)
RBC: 3.06 MIL/uL — ABNORMAL LOW (ref 4.22–5.81)
RDW: 14.6 % (ref 11.5–15.5)
WBC: 15.8 10*3/uL — ABNORMAL HIGH (ref 4.0–10.5)
nRBC: 0 % (ref 0.0–0.2)

## 2021-02-27 LAB — PROCALCITONIN: Procalcitonin: 0.44 ng/mL

## 2021-02-27 MED ORDER — HYDRALAZINE HCL 25 MG PO TABS: 25.0000 mg | ORAL_TABLET | Freq: Four times a day (QID) | ORAL | Status: DC | PRN

## 2021-02-27 MED ORDER — FUROSEMIDE 10 MG/ML IJ SOLN
20.0000 mg | Freq: Once | INTRAMUSCULAR | Status: AC
  Administered 2021-02-27: 20 mg via INTRAVENOUS
  Filled 2021-02-27: qty 2

## 2021-02-27 NOTE — Progress Notes (Signed)
PROGRESS NOTE  Philip Mcclain BZJ:[id number] DOB: 08-08-1934 DOA: 02/19/2021 PCP: Glenda Chroman, MD  HPI/Recap of past 64 hours: 87 year old with past medical history of pulmonary hypertension, persistent atrial fibrillation on Eliquis [place] and hypertension seen by orthopedics in the outpatient setting and underwent left knee aspiration for steroid injection on June 14 with fluid findings consistent with gout, but also grew out Pseudomonas.  Brought into the emergency room with a swollen left knee and confusion on 6/18 and found to be in severe sepsis from infected left knee joint.  Patient started on IV fluids and antibiotics and admitted to the hospitalist service.  Orthopedics consulted and patient underwent left knee washout on 6/22.  Since then was having some severe knee pain over the next 24 hours and on night of 6/23, developed upper extremity tremors  Since then, knee pain and tremors have both improved.  Patient however is a bit more confused today and sometimes seems to repeat himself.  He assures me that everything is fine.  Patient's daughter also noted that he appeared to be more confused today.  Also noted that his blood pressure has been trending up over the last 24 hours with a systolic ranging from 017P to 170s.  Hospital course:   Pulmonary HTN [facility]): Clinically compensated.    Essential hypertension: Blood pressures have been trending upward.  Have restarted patient's Lasix.  Has responded with significant diuresis and has diuresed over 8 L since hospitalization.  We will give additional Lasix given elevated blood pressures.  Adding as needed hydralazine.    Persistent atrial fibrillation ([place]): Rate controlled.  Eliquis restarted.  Acute delirium: Doubtful on infection given declining procalcitonin and no fever although white count minimally higher than previous day.  That said, already on cefepime.  Already stopped morphine, but did last get Oxy IR last night.  We will  discontinue this as well.  May also be related to high blood pressure  Severe pseudomonal sepsis from septic joint of left knee joint [facility]): Patient met criteria for severe sepsis given tachycardia, tachypnea, leukocytosis with acute kidney injury, lactic acidosis, present on admission.  Sepsis stabilized.  Lactic acid now normalized.  Going for knee washout today.  Continue antibiotics.  Previous cultures grew out Pseudomonas and cultures from washout grew out sensitive Pseudomonas added Toradol x1 for knee pain in addition to his other pain medications.  Discussed with infectious disease who recommend 4 weeks of IV antibiotics.  PICC line placed 6/25.  Infectious disease following.  Patient to be on cefepime 2 g twice a day for the next 3 weeks for total of 4 weeks  AKI in the setting of stage IIIb chronic kidney disease: Patient on admission with creatinine of 2.16, due to sepsis.  With IV fluids, has improved.  Creatinine staying around 1.8 with GFR 37, much closer to patient's baseline.  Tremors: Unclear etiology.  Possibly from morphine so this was discontinued.  Slowly improving    BPH with obstruction/lower urinary tract symptoms    Toxic metabolic encephalopathy: Due to sepsis.  Improved with fluids and antibiotics.  Acute hyponatremia: Secondary to hypovolemia.  Much improved with fluids   Code Status: DNR  Family Communication: Updated daughter by phone  Disposition Plan: Patient does not want to go to skilled nursing.  However daughter feels that he would not be safe going home, so she will work to convince him to go.  He will need continued PICC line and IV antibiotics for another 3  weeks  Consultants: Orthopedic surgery  Procedures: Left knee washout 6/22 Placement of PICC line 6/25  Antimicrobials: Vancomycin and Flagyl 6/18 only Cefepime 6/18-7/16  DVT prophylaxis: Will resume Eliquis  Level of care: Telemetry   Objective: Vitals:   02/27/21 0401 02/27/21 0947   BP: (!) 188/89 (!) 173/52  Pulse: 69 78  Resp: 20   Temp: 98.1 F (36.7 C)   SpO2: 100%     Intake/Output Summary (Last 24 hours) at 02/27/2021 1051 Last data filed at 02/27/2021 0900 Gross per 24 hour  Intake 4088.66 ml  Output 1200 ml  Net 2888.66 ml    Filed Weights   02/19/21 1248 02/23/21 1223  Weight: 79.8 kg 79.8 kg   Body mass index is 24.54 kg/m.  Exam:  General: Alert and oriented x1-2, no acute distress Cardiovascular: Regular rate and rhythm, S1-S2 Respiratory: Clear to auscultation bilaterally Abdomen: Soft, nontender, nondistended, positive bowel sounds Musculoskeletal: No clubbing or cyanosis, trace pitting edema.  Left knee bandaged, tender, swollen Skin: No skin breaks, tears or lesions Psychiatry: Interactive, but more confused than previous days Neurology: No focal deficits   Data Reviewed: CBC: Recent Labs  Lab 02/23/21 1729 02/24/21 0357 02/25/21 0502 02/26/21 0854 02/27/21 0336  WBC 16.7* 17.1* 14.3* 14.2* 15.8*  HGB 10.3* 9.2* 8.2* 9.5* 9.5*  HCT 32.8* 29.1* 26.3* 29.5* 29.6*  MCV 99.4 100.3* 100.4* 97.7 96.7  PLT 362 351 295 312 659    Basic Metabolic Panel: Recent Labs  Lab 02/21/21 0557 02/22/21 0600 02/23/21 1729 02/24/21 0357 02/25/21 0502 02/26/21 0854 02/27/21 0336  NA 133* 133*  --  134* 133* 133* 133*  K 4.4 4.4  --  4.6 4.5 4.1 4.1  CL 106 107  --  109 111 109 107  CO2 21* 19*  --  19* 19* 18* 18*  GLUCOSE 106* 98  --  90 92 98 108*  BUN 48* 47*  --  37* 42* 40* 39*  CREATININE 1.49* 1.54* 1.45* 1.40* 1.88* 1.81* 1.79*  CALCIUM 9.2 9.4  --  9.2 8.7* 9.1 8.9  MG 2.0  --   --   --   --   --   --     GFR: Estimated Creatinine Clearance: 32.7 mL/min (A) (by C-G formula based on SCr of 1.79 mg/dL (H)). Liver Function Tests: Recent Labs  Lab 02/24/21 0357  AST 13*  ALT 15  ALKPHOS 80  BILITOT 0.8  PROT 5.1*  ALBUMIN 2.1*    No results for input(s): LIPASE, AMYLASE in the last 168 hours. No results for  input(s): AMMONIA in the last 168 hours.  Coagulation Profile: No results for input(s): INR, PROTIME in the last 168 hours.  Cardiac Enzymes: No results for input(s): CKTOTAL, CKMB, CKMBINDEX, TROPONINI in the last 168 hours.  BNP (last 3 results) No results for input(s): PROBNP in the last 8760 hours. HbA1C: No results for input(s): HGBA1C in the last 72 hours. CBG: No results for input(s): GLUCAP in the last 168 hours. Lipid Profile: No results for input(s): CHOL, HDL, LDLCALC, TRIG, CHOLHDL, LDLDIRECT in the last 72 hours. Thyroid Function Tests: No results for input(s): TSH, T4TOTAL, FREET4, T3FREE, THYROIDAB in the last 72 hours. Anemia Panel: No results for input(s): VITAMINB12, FOLATE, FERRITIN, TIBC, IRON, RETICCTPCT in the last 72 hours.  Urine analysis:    Component Value Date/Time   COLORURINE YELLOW 02/19/2021 1300   APPEARANCEUR CLEAR 02/19/2021 1300   APPEARANCEUR Clear 01/17/2021 1342   LABSPEC 1.012 02/19/2021 1300  PHURINE 5.0 02/19/2021 1300   GLUCOSEU NEGATIVE 02/19/2021 1300   HGBUR MODERATE (A) 02/19/2021 1300   BILIRUBINUR NEGATIVE 02/19/2021 1300   BILIRUBINUR Negative 01/17/2021 [place] 02/19/2021 1300   PROTEINUR NEGATIVE 02/19/2021 1300   NITRITE NEGATIVE 02/19/2021 [place] 02/19/2021 1300   Sepsis Labs: _0 (procalcitonin:4,lacticidven:4)  ) Recent Results (from the past 240 hour(s))  Urine Culture     Status: None   Collection Time: 02/19/21  1:00 PM   Specimen: Urine, Catheterized  Result Value Ref Range Status   Specimen Description   Final    URINE, CATHETERIZED Performed at [facility], [address] [place], [place] [address]    Special Requests   Final    NONE Performed at [facility], [address] [place], [place] [address]    Culture   Final    NO GROWTH Performed at [facility], [place] [address] [place], [place] [address]    Report Status 02/21/2021 FINAL  Final   Resp Panel by RT-PCR (Flu A&B, Covid) Nasopharyngeal Swab     Status: None   Collection Time: 02/19/21  2:29 PM   Specimen: Nasopharyngeal Swab; Nasopharyngeal(NP) swabs in vial transport medium  Result Value Ref Range Status   SARS Coronavirus 2 by RT PCR NEGATIVE NEGATIVE Final    Comment: (NOTE) SARS-CoV-2 target nucleic acids are NOT DETECTED.  The SARS-CoV-2 RNA is generally detectable in upper respiratory specimens during the acute phase of infection. The lowest concentration of SARS-CoV-2 viral copies this assay can detect is 138 copies/mL. A negative result does not preclude SARS-Cov-2 infection and should not be used as the sole basis for treatment or other patient management decisions. A negative result may occur with  improper specimen collection/handling, submission of specimen other than nasopharyngeal swab, presence of viral mutation(s) within the areas targeted by this assay, and inadequate number of viral copies(<138 copies/mL). A negative result must be combined with clinical observations, patient history, and epidemiological information. The expected result is Negative.  Fact Sheet for Patients:  EntrepreneurPulse.com.au  Fact Sheet for Healthcare Providers:  IncredibleEmployment.be  This test is no t yet approved or cleared by the [place] FDA and  has been authorized for detection and/or diagnosis of SARS-CoV-2 by FDA under an Emergency Use Authorization (EUA). This EUA will remain  in effect (meaning this test can be used) for the duration of the COVID-19 declaration under Section 564(b)(1) of the Act, 21 U.S.C.section 360bbb-3(b)(1), unless the authorization is terminated  or revoked sooner.       Influenza A by PCR NEGATIVE NEGATIVE Final   Influenza B by PCR NEGATIVE NEGATIVE Final    Comment: (NOTE) The Xpert Xpress SARS-CoV-2/FLU/RSV plus assay is intended as an aid in the diagnosis of influenza from  Nasopharyngeal swab specimens and should not be used as a sole basis for treatment. Nasal washings and aspirates are unacceptable for Xpert Xpress SARS-CoV-2/FLU/RSV testing.  Fact Sheet for Patients: EntrepreneurPulse.com.au  Fact Sheet for Healthcare Providers: IncredibleEmployment.be  This test is not yet approved or cleared by the [place] FDA and has been authorized for detection and/or diagnosis of SARS-CoV-2 by FDA under an Emergency Use Authorization (EUA). This EUA will remain in effect (meaning this test can be used) for the duration of the COVID-19 declaration under Section 564(b)(1) of the Act, 21 U.S.C. section 360bbb-3(b)(1), unless the authorization is terminated or revoked.  Performed at [facility], [address], [place], [place] [address]  Blood Culture (routine x 2)     Status: None   Collection Time: 02/19/21  2:33 PM   Specimen: BLOOD RIGHT HAND  Result Value Ref Range Status   Specimen Description   Final    BLOOD RIGHT HAND BOTTLES DRAWN AEROBIC AND ANAEROBIC   Special Requests   Final    Blood Culture results may not be optimal due to an inadequate volume of blood received in culture bottles   Culture   Final    NO GROWTH 6 DAYS Performed at [facility], [address] [place], [place] [address]    Report Status 02/25/2021 FINAL  Final  Blood Culture (routine x 2)     Status: None   Collection Time: 02/19/21  2:33 PM   Specimen: BLOOD LEFT ARM  Result Value Ref Range Status   Specimen Description BLOOD LEFT ARM BOTTLES DRAWN AEROBIC AND ANAEROBIC  Final   Special Requests   Final    Blood Culture results may not be optimal due to an inadequate volume of blood received in culture bottles   Culture   Final    NO GROWTH 6 DAYS Performed at [facility], [address] [place], [place] [address]    Report Status 02/25/2021 FINAL  Final  Body fluid culture w Gram Stain     Status: None   Collection Time:  02/19/21  3:36 PM   Specimen: KNEE; Body Fluid  Result Value Ref Range Status   Specimen Description   Final    KNEE JOINT FLUID Performed at [facility], [address], [place], [place] [address]    Special Requests   Final    Normal Performed at [facility], [address], [place], [place] [address]    Gram Stain   Final    NO ORGANISMS SEEN WBC PRESENT, PREDOMINANTLY PMN Performed at [facility], [address], [place], [place] [address]    Culture   Final    FEW PSEUDOMONAS AERUGINOSA CRITICAL RESULT CALLED TO, READ BACK BY AND VERIFIED WITH: RN E.WRIGHT ON [id number] AT 2202 BY E.PARRISH Performed at [facility], [place] [address], [place], [place] [address]    Report Status 02/23/2021 FINAL  Final   Organism ID, Bacteria PSEUDOMONAS AERUGINOSA  Final      Susceptibility   Pseudomonas aeruginosa - MIC*    CEFTAZIDIME 2 SENSITIVE Sensitive     CIPROFLOXACIN <=0.25 SENSITIVE Sensitive     GENTAMICIN <=1 SENSITIVE Sensitive     IMIPENEM 1 SENSITIVE Sensitive     PIP/TAZO <=4 SENSITIVE Sensitive     CEFEPIME 2 SENSITIVE Sensitive     * FEW PSEUDOMONAS AERUGINOSA  Surgical PCR screen     Status: None   Collection Time: 02/23/21  2:36 AM   Specimen: Nasal Mucosa; Nasal Swab  Result Value Ref Range Status   MRSA, PCR NEGATIVE NEGATIVE Final   Staphylococcus aureus NEGATIVE NEGATIVE Final    Comment: (NOTE) The Xpert SA Assay (FDA approved for NASAL specimens in patients 78 years of age and older), is one component of a comprehensive surveillance program. It is not intended to diagnose infection nor to guide or monitor treatment. Performed at [facility], [address] [place], [place] [address]       Studies: DG CHEST PORT 1 VIEW  Result Date: 02/26/2021 CLINICAL DATA:  Evaluate PICC line placement EXAM: PORTABLE CHEST 1 VIEW COMPARISON:  February 19, 2021 FINDINGS: A new right PICC line terminates in the central SVC. No  pneumothorax. Stable cardiomegaly. No other  changes. IMPRESSION: The new right PICC line terminates in the central SVC. No pneumothorax. No other changes. Electronically Signed   By: Dorise Bullion III M.D   On: 02/26/2021 15:22    Scheduled Meds:  acetaminophen  650 mg Oral Q6H   allopurinol  100 mg Oral Daily   apixaban  2.5 mg Oral BID   Chlorhexidine Gluconate Cloth  6 each Topical Daily   docusate sodium  100 mg Oral BID   finasteride  5 mg Oral Daily   irbesartan  150 mg Oral Daily   metoprolol succinate  50 mg Oral BID   pantoprazole  40 mg Oral QPM   sodium chloride flush  10-40 mL Intracatheter Q12H   tamsulosin  0.4 mg Oral BID    Continuous Infusions:  sodium chloride 60 mL/hr at 02/27/21 0944   ceFEPime (MAXIPIME) IV 2 g (02/27/21 0955)     LOS: 8 days     Annita Brod, MD [facility]   02/27/2021, 10:51 AM

## 2021-02-27 NOTE — TOC Progression Note (Signed)
Transition of Care [facility]) - Progression Note    Patient Details  Name: KEENA CORRIS MRN: [id number] Date of Birth: 28-Jun-1934  Transition of Care [facility]) CM/SW Contact  Natasha Bence, LCSW Phone Number: 02/27/2021, 1:04 PM  Clinical Narrative:    Patient agreeable to [facility] services. CSW referred patient to [place] with Alvis Lemmings. Georgina Snell agreeable to provide [facility] services. TOC to follow.    Expected Discharge Plan: [place] Barriers to Discharge: Continued Medical Work up  Expected Discharge Plan and Services Expected Discharge Plan: [place] Choice: [place] arrangements for the past 2 months: Single Family Home                                       Social Determinants of Health (SDOH) Interventions    Readmission Risk Interventions No flowsheet data found.

## 2021-02-28 ENCOUNTER — Inpatient Hospital Stay (HOSPITAL_COMMUNITY): Payer: Medicare Other

## 2021-02-28 ENCOUNTER — Encounter (HOSPITAL_COMMUNITY): Payer: Self-pay | Admitting: Orthopedic Surgery

## 2021-02-28 LAB — URINALYSIS, ROUTINE W REFLEX MICROSCOPIC
Bacteria, UA: NONE SEEN
Bilirubin Urine: NEGATIVE
Glucose, UA: NEGATIVE mg/dL
Ketones, ur: NEGATIVE mg/dL
Leukocytes,Ua: NEGATIVE
Nitrite: NEGATIVE
Protein, ur: 30 mg/dL — AB
Specific Gravity, Urine: 1.011 (ref 1.005–1.030)
pH: 5 (ref 5.0–8.0)

## 2021-02-28 LAB — CBC
HCT: 29.7 % — ABNORMAL LOW (ref 39.0–52.0)
Hemoglobin: 9.9 g/dL — ABNORMAL LOW (ref 13.0–17.0)
MCH: 31.5 pg (ref 26.0–34.0)
MCHC: 33.3 g/dL (ref 30.0–36.0)
MCV: 94.6 fL (ref 80.0–100.0)
Platelets: 308 10*3/uL (ref 150–400)
RBC: 3.14 MIL/uL — ABNORMAL LOW (ref 4.22–5.81)
RDW: 14.4 % (ref 11.5–15.5)
WBC: 17.1 10*3/uL — ABNORMAL HIGH (ref 4.0–10.5)
nRBC: 0 % (ref 0.0–0.2)

## 2021-02-28 LAB — BASIC METABOLIC PANEL
Anion gap: 5 (ref 5–15)
BUN: 34 mg/dL — ABNORMAL HIGH (ref 8–23)
CO2: 19 mmol/L — ABNORMAL LOW (ref 22–32)
Calcium: 9.2 mg/dL (ref 8.9–10.3)
Chloride: 108 mmol/L (ref 98–111)
Creatinine, Ser: 1.67 mg/dL — ABNORMAL HIGH (ref 0.61–1.24)
GFR, Estimated: 40 mL/min — ABNORMAL LOW (ref >60)
Glucose, Bld: 107 mg/dL — ABNORMAL HIGH (ref 70–99)
Potassium: 3.8 mmol/L (ref 3.5–5.1)
Sodium: 132 mmol/L — ABNORMAL LOW (ref 135–145)

## 2021-02-28 MED ORDER — TRAMADOL HCL 50 MG PO TABS
50.0000 mg | ORAL_TABLET | Freq: Four times a day (QID) | ORAL | Status: DC | PRN
  Administered 2021-02-28 – 2021-03-08 (×15): 50 mg via ORAL
  Filled 2021-02-28 (×15): qty 1

## 2021-02-28 MED ORDER — FUROSEMIDE 10 MG/ML IJ SOLN
20.0000 mg | Freq: Two times a day (BID) | INTRAMUSCULAR | Status: DC
  Administered 2021-02-28 – 2021-03-02 (×5): 20 mg via INTRAVENOUS
  Filled 2021-02-28 (×5): qty 2

## 2021-02-28 NOTE — TOC Progression Note (Signed)
Transition of Care [facility]) - Progression Note    Patient Details  Name: ARL VANDORN MRN: [id number] Date of Birth: 1934/09/01  Transition of Care [facility]) CM/SW [place], LCSW Phone Number: 02/28/2021, 4:28 PM  Clinical Narrative:    Patient's daughter reported that she felt that patient needed SNF. CSW reported that patient and wife had initially declined. Patient's daughter reported that she intended to strongly persuade patient into deciding SNF. CSW also informed patient's daughter that the next of kin, patient's wife would be consulted if patient was not oriented to make a decision for SNF. CSW spoke with patient to inquire if patient agreeable to SNF. Patient reported that he did not know if he wanted to participate in SNF. As conversation progressed, patient was not able to articulate full sentences. Patient's grandson requested that placement discussion be had in the morning with patient when his is most able to engage in conversation. TOC to follow.    Expected Discharge Plan: [place] Barriers to Discharge: Continued Medical Work up  Expected Discharge Plan and Services Expected Discharge Plan: [place] Choice: [place] arrangements for the past 2 months: Single Family Home                                       Social Determinants of Health (SDOH) Interventions    Readmission Risk Interventions No flowsheet data found.

## 2021-02-28 NOTE — NC FL2 (Signed)
[place] LEVEL OF CARE SCREENING TOOL     IDENTIFICATION  Patient Name: Philip Mcclain Birthdate: 18-Jan-1934 Sex: male Admission Date (Current Location): 02/19/2021  [facility] and [place] Number:  [employer] and Address:  [place] [address] [place]      Provider Number: [id number]  Attending Physician Name and Address:  Annita Brod, MD  Relative Name and Phone Number:       Current Level of Care: Hospital Recommended Level of Care: [place] Prior Approval Number:    Date Approved/Denied: 02/28/21 PASRR Number: [id number]  Discharge Plan: SNF    Current Diagnoses: Patient Active Problem List   Diagnosis Date Noted   Septic joint of left knee joint ([place]) 02/19/2021   BPH with obstruction/lower urinary tract symptoms 02/19/2021   Sepsis ([place]) 123456   Toxic metabolic encephalopathy 123456   Pulmonary HTN ([place]) 12/28/2020   Dyslipidemia 12/28/2020   Essential hypertension 12/28/2020   Persistent atrial fibrillation ([place]) 12/28/2020   CVA (cerebral vascular accident) ([place]) 08/19/2020   AKI (acute kidney injury) ([place])     Orientation RESPIRATION BLADDER Height & Weight     Self  Normal Incontinent Weight: 175 lb 14.8 oz (79.8 kg) Height:  '5\' 11"'$  (180.3 cm)  BEHAVIORAL SYMPTOMS/MOOD NEUROLOGICAL BOWEL NUTRITION STATUS      Incontinent Diet  AMBULATORY STATUS COMMUNICATION OF NEEDS Skin   Extensive Assist Verbally Skin abrasions, Surgical wounds (Incision Right knee)                       Personal Care Assistance Level of Assistance  Bathing, Feeding, Dressing   Feeding assistance: Independent Dressing Assistance: Maximum assistance     Functional Limitations Info  Sight, Hearing, Speech Sight Info: Adequate Hearing Info: Adequate Speech Info: Adequate    SPECIAL CARE FACTORS FREQUENCY  PT (By licensed PT)     PT Frequency: 5x              Contractures  Contractures Info: Not present    Additional Factors Info  Code Status, Allergies Code Status Info: Full Allergies Info: N/A           Current Medications (02/28/2021):  This is the current hospital active medication list Current Facility-Administered Medications  Medication Dose Route Frequency Provider Last Rate Last Admin   acetaminophen (TYLENOL) tablet 650 mg  650 mg Oral Q6H Carole Civil, MD   650 mg at 02/28/21 1600   allopurinol (ZYLOPRIM) tablet 100 mg  100 mg Oral Daily Carole Civil, MD   100 mg at 02/28/21 [id number]   apixaban (ELIQUIS) tablet 2.5 mg  2.5 mg Oral BID Annita Brod, MD   2.5 mg at 02/28/21 [id number]   ceFEPIme (MAXIPIME) 2 g in sodium chloride 0.9 % 100 mL IVPB  2 g Intravenous Q12H Carole Civil, MD 200 mL/hr at 02/28/21 0943 2 g at 02/28/21 0943   Chlorhexidine Gluconate Cloth 2 % PADS 6 each  6 each Topical Daily Annita Brod, MD   6 each at 02/28/21 1011   docusate sodium (COLACE) capsule 100 mg  100 mg Oral BID Carole Civil, MD   100 mg at 02/28/21 [id number]   finasteride (PROSCAR) tablet 5 mg  5 mg Oral Daily Carole Civil, MD   5 mg at 02/28/21 0936   furosemide (LASIX) injection 20 mg  20 mg Intravenous BID Annita Brod, MD  20 mg at 02/28/21 1019   hydrALAZINE (APRESOLINE) tablet 25 mg  25 mg Oral Q6H PRN Annita Brod, MD       irbesartan (AVAPRO) tablet 150 mg  150 mg Oral Daily Carole Civil, MD   150 mg at 02/28/21 0936   metoprolol succinate (TOPROL-XL) 24 hr tablet 50 mg  50 mg Oral BID Carole Civil, MD   50 mg at 02/28/21 0937   ondansetron (ZOFRAN) tablet 4 mg  4 mg Oral Q6H PRN Carole Civil, MD       Or   ondansetron [facility]) injection 4 mg  4 mg Intravenous Q6H PRN Carole Civil, MD       pantoprazole (PROTONIX) EC tablet 40 mg  40 mg Oral QPM Carole Civil, MD   40 mg at 02/27/21 1722   sodium chloride flush (NS) 0.9 % injection 10-40 mL  10-40 mL Intracatheter Q12H  Annita Brod, MD       sodium chloride flush (NS) 0.9 % injection 10-40 mL  10-40 mL Intracatheter PRN Annita Brod, MD       tamsulosin (FLOMAX) capsule 0.4 mg  0.4 mg Oral BID Carole Civil, MD   0.4 mg at 02/28/21 [id number]   traMADol (ULTRAM) tablet 50 mg  50 mg Oral Q6H PRN Annita Brod, MD   50 mg at 02/28/21 1600     Discharge Medications: Please see discharge summary for a list of discharge medications.  Relevant Imaging Results:  Relevant Lab Results:   Additional Information PT [id number]  Natasha Bence, LCSW

## 2021-02-28 NOTE — Progress Notes (Signed)
Patient daughter at bedside upset with nutrition lady , as she accidentally brought in wrong tray for full liquid. Patient 2 gram diet , tried to redirect patients daughter whom is being rude with staff. She asked to see the physician , I told her physician had made rounds earlier. She states " noone has called me , I am upset with this hospital " . Positive reinforcement used however daughter not very receptive of it. Notified Dr. [place] Pink,. Patients daugher then proceeded to verbalize " A phone call will not work". Thanked her for her time, and told her I would let her know if I hear from the physician .

## 2021-02-28 NOTE — Plan of Care (Signed)

## 2021-02-28 NOTE — Progress Notes (Signed)
PROGRESS NOTE  Philip Mcclain XFG:[id number] DOB: 1934-01-02 DOA: 02/19/2021 PCP: Glenda Chroman, MD  HPI/Recap of past 14 hours: 87 year old with past medical history of pulmonary hypertension, persistent atrial fibrillation on Eliquis [place] and hypertension seen by orthopedics in the outpatient setting and underwent left knee aspiration for steroid injection on June 14 with fluid findings consistent with gout, but also grew out Pseudomonas.  Brought into the emergency room with a swollen left knee and confusion on 6/18 and found to be in severe sepsis from infected left knee joint.  Patient started on IV fluids and antibiotics and admitted to the hospitalist service.  Orthopedics consulted and patient underwent left knee washout on 6/22.  Since then was having some severe knee pain over the next 24 hours and on night of 6/23, developed upper extremity tremors  Initially, knee pain and tremors have both improved, especially with discontinuation of morphine and Oxy IR.  In the last 2 days, patient a bit more confused.  Seems a little more lucid today although having significant knee pain.  Blood pressures have also trended up in the last 48 hours with systolic in the 967E to 938B  Hospital course:   Pulmonary HTN [facility]): Clinically compensated.    Essential hypertension: Blood pressures have been trending upward.  Have restarted patient's Lasix.  Has responded with significant diuresis and has diuresed over 9 L since hospitalization.  We will give additional Lasix given elevated blood pressures.  Adding as needed hydralazine.      Persistent atrial fibrillation (Ada): Rate controlled.  Eliquis restarted.  Acute delirium: Doubtful on infection given declining procalcitonin and no fever although white count minimally higher than previous day.  That said, already on cefepime.  Not from medication.  Slightly improved from previous day?  Checking head CT to rule out CVA.  This may also account for elevated  blood pressure.  No signs of additional infection.  Already on cefepime.  Urinalysis unremarkable.  Lungs are clear no hypoxia.  May need to consider repeat imaging of knee.  Will check CRP.  Severe pseudomonal sepsis from septic joint of left knee joint [facility]): Patient met criteria for severe sepsis given tachycardia, tachypnea, leukocytosis with acute kidney injury, lactic acidosis, present on admission.  Sepsis stabilized.  Lactic acid now normalized.  Going for knee washout today.  Continue antibiotics.  Previous cultures grew out Pseudomonas and cultures from washout grew out sensitive Pseudomonas added Toradol x1 for knee pain in addition to his other pain medications.  Discussed with infectious disease who recommend 4 weeks of IV antibiotics.  PICC line placed 6/25.  Infectious disease following.  Patient to be on cefepime 2 g twice a day for the next 3 weeks for total of 4 weeks.  AKI in the setting of stage IIIb chronic kidney disease: Patient on admission with creatinine of 2.16, due to sepsis.  With IV fluids, has improved.  Creatinine staying around 1.8 with GFR 37, much closer to patient's baseline.  Tremors: Unclear etiology.  Possibly from morphine so this was discontinued.  Resolved    BPH with obstruction/lower urinary tract symptoms    Toxic metabolic encephalopathy: Initially due to to sepsis.  Improved with fluids and antibiotics.  Now recovering.  See above.  Acute hyponatremia: Secondary to hypovolemia.  Much improved with fluids.  Now mild.   Code Status: DNR  Family Communication: Updated daughter by phone  Disposition Plan: Patient does not want to go to skilled nursing.  However  daughter feels that he would not be safe going home, so she will work to convince him to go.  He will need continued PICC line and IV antibiotics for another 3 weeks  Consultants: Orthopedic surgery  Procedures: Left knee washout 6/22 Placement of PICC line  6/25  Antimicrobials: Vancomycin and Flagyl 6/18 only Cefepime 6/18-7/16  DVT prophylaxis: Will resume Eliquis  Level of care: Telemetry   Objective: Vitals:   02/27/21 2024 02/28/21 0336  BP: (!) 182/98 (!) 176/78  Pulse: 70 66  Resp: 20 18  Temp: (!) 97.3 F (36.3 C) 98.4 F (36.9 C)  SpO2: 100% 100%    Intake/Output Summary (Last 24 hours) at 02/28/2021 1224 Last data filed at 02/28/2021 0900 Gross per 24 hour  Intake 720 ml  Output 3100 ml  Net -2380 ml    Filed Weights   02/19/21 1248 02/23/21 1223  Weight: 79.8 kg 79.8 kg   Body mass index is 24.54 kg/m.  Exam:  General: Alert and oriented x1, mild distress secondary to knee pain Cardiovascular: Regular rate and rhythm, S1-S2 Respiratory: Clear to auscultation bilaterally Abdomen: Soft, nontender, nondistended, positive bowel sounds Musculoskeletal: No clubbing or cyanosis, trace pitting edema.  Left knee bandaged, tender, swollen, drain noted. Skin: No skin breaks, tears or lesions Psychiatry: Interactive, but more confused than previous days Neurology: No focal deficits   Data Reviewed: CBC: Recent Labs  Lab 02/24/21 0357 02/25/21 0502 02/26/21 0854 02/27/21 0336 02/28/21 0529  WBC 17.1* 14.3* 14.2* 15.8* 17.1*  HGB 9.2* 8.2* 9.5* 9.5* 9.9*  HCT 29.1* 26.3* 29.5* 29.6* 29.7*  MCV 100.3* 100.4* 97.7 96.7 94.6  PLT 351 295 312 335 546    Basic Metabolic Panel: Recent Labs  Lab 02/24/21 0357 02/25/21 0502 02/26/21 0854 02/27/21 0336 02/28/21 0529  NA 134* 133* 133* 133* 132*  K 4.6 4.5 4.1 4.1 3.8  CL 109 111 109 107 108  CO2 19* 19* 18* 18* 19*  GLUCOSE 90 92 98 108* 107*  BUN 37* 42* 40* 39* 34*  CREATININE 1.40* 1.88* 1.81* 1.79* 1.67*  CALCIUM 9.2 8.7* 9.1 8.9 9.2    GFR: Estimated Creatinine Clearance: 35.1 mL/min (A) (by C-G formula based on SCr of 1.67 mg/dL (H)). Liver Function Tests: Recent Labs  Lab 02/24/21 0357  AST 13*  ALT 15  ALKPHOS 80  BILITOT 0.8  PROT  5.1*  ALBUMIN 2.1*    No results for input(s): LIPASE, AMYLASE in the last 168 hours. No results for input(s): AMMONIA in the last 168 hours.  Coagulation Profile: No results for input(s): INR, PROTIME in the last 168 hours.  Cardiac Enzymes: No results for input(s): CKTOTAL, CKMB, CKMBINDEX, TROPONINI in the last 168 hours.  BNP (last 3 results) No results for input(s): PROBNP in the last 8760 hours. HbA1C: No results for input(s): HGBA1C in the last 72 hours. CBG: No results for input(s): GLUCAP in the last 168 hours. Lipid Profile: No results for input(s): CHOL, HDL, LDLCALC, TRIG, CHOLHDL, LDLDIRECT in the last 72 hours. Thyroid Function Tests: No results for input(s): TSH, T4TOTAL, FREET4, T3FREE, THYROIDAB in the last 72 hours. Anemia Panel: No results for input(s): VITAMINB12, FOLATE, FERRITIN, TIBC, IRON, RETICCTPCT in the last 72 hours.  Urine analysis:    Component Value Date/Time   COLORURINE YELLOW 02/28/2021 1020   APPEARANCEUR CLEAR 02/28/2021 1020   APPEARANCEUR Clear 01/17/2021 1342   LABSPEC 1.011 02/28/2021 1020   PHURINE 5.0 02/28/2021 1020   GLUCOSEU NEGATIVE 02/28/2021 1020  HGBUR MODERATE (A) 02/28/2021 1020   BILIRUBINUR NEGATIVE 02/28/2021 1020   BILIRUBINUR Negative 01/17/2021 [place] 02/28/2021 1020   PROTEINUR 30 (A) 02/28/2021 1020   NITRITE NEGATIVE 02/28/2021 1020   LEUKOCYTESUR NEGATIVE 02/28/2021 1020   Sepsis Labs: $[email or web address]$ (procalcitonin:4,lacticidven:4)  ) Recent Results (from the past 240 hour(s))  Urine Culture     Status: None   Collection Time: 02/19/21  1:00 PM   Specimen: Urine, Catheterized  Result Value Ref Range Status   Specimen Description   Final    URINE, CATHETERIZED Performed at [facility], [address], [place], [place] [address]    Special Requests   Final    NONE Performed at [facility], [address], [place], [place] [address]    Culture   Final    NO GROWTH Performed at [facility], [place] [address] [place],  [place] [address]    Report Status 02/21/2021 FINAL  Final  Resp Panel by RT-PCR (Flu A&B, Covid) Nasopharyngeal Swab     Status: None   Collection Time: 02/19/21  2:29 PM   Specimen: Nasopharyngeal Swab; Nasopharyngeal(NP) swabs in vial transport medium  Result Value Ref Range Status   SARS Coronavirus 2 by RT PCR NEGATIVE NEGATIVE Final    Comment: (NOTE) SARS-CoV-2 target nucleic acids are NOT DETECTED.  The SARS-CoV-2 RNA is generally detectable in upper respiratory specimens during the acute phase of infection. The lowest concentration of SARS-CoV-2 viral copies this assay can detect is 138 copies/mL. A negative result does not preclude SARS-Cov-2 infection and should not be used as the sole basis for treatment or other patient management decisions. A negative result may occur with  improper specimen collection/handling, submission of specimen other than nasopharyngeal swab, presence of viral mutation(s) within the areas targeted by this assay, and inadequate number of viral copies(<138 copies/mL). A negative result must be combined with clinical observations, patient history, and epidemiological information. The expected result is Negative.  Fact Sheet for Patients:  EntrepreneurPulse.com.au  Fact Sheet for Healthcare Providers:  IncredibleEmployment.be  This test is no t yet approved or cleared by the [place] FDA and  has been authorized for detection and/or diagnosis of SARS-CoV-2 by FDA under an Emergency Use Authorization (EUA). This EUA will remain  in effect (meaning this test can be used) for the duration of the COVID-19 declaration under Section 564(b)(1) of the Act, 21 U.S.C.section 360bbb-3(b)(1), unless the authorization is terminated  or revoked sooner.       Influenza A by PCR NEGATIVE NEGATIVE Final   Influenza B by PCR NEGATIVE NEGATIVE Final    Comment: (NOTE) The Xpert  Xpress SARS-CoV-2/FLU/RSV plus assay is intended as an aid in the diagnosis of influenza from Nasopharyngeal swab specimens and should not be used as a sole basis for treatment. Nasal washings and aspirates are unacceptable for Xpert Xpress SARS-CoV-2/FLU/RSV testing.  Fact Sheet for Patients: EntrepreneurPulse.com.au  Fact Sheet for Healthcare Providers: IncredibleEmployment.be  This test is not yet approved or cleared by the [place] FDA and has been authorized for detection and/or diagnosis of SARS-CoV-2 by FDA under an Emergency Use Authorization (EUA). This EUA will remain in effect (meaning this test can be used) for the duration of the COVID-19 declaration under Section 564(b)(1) of the Act, 21 U.S.C. section 360bbb-3(b)(1), unless the authorization is terminated or revoked.  Performed at [facility], [address] [place], [place] [address]   Blood Culture (routine x 2)     Status:  None   Collection Time: 02/19/21  2:33 PM   Specimen: BLOOD RIGHT HAND  Result Value Ref Range Status   Specimen Description   Final    BLOOD RIGHT HAND BOTTLES DRAWN AEROBIC AND ANAEROBIC   Special Requests   Final    Blood Culture results may not be optimal due to an inadequate volume of blood received in culture bottles   Culture   Final    NO GROWTH 6 DAYS Performed at [facility], [address], [place], [place] [address]    Report Status 02/25/2021 FINAL  Final  Blood Culture (routine x 2)     Status: None   Collection Time: 02/19/21  2:33 PM   Specimen: BLOOD LEFT ARM  Result Value Ref Range Status   Specimen Description BLOOD LEFT ARM BOTTLES DRAWN AEROBIC AND ANAEROBIC  Final   Special Requests   Final    Blood Culture results may not be optimal due to an inadequate volume of blood received in culture bottles   Culture   Final    NO GROWTH 6 DAYS Performed at [facility], [address], [place], [place] [address]    Report Status  02/25/2021 FINAL  Final  Body fluid culture w Gram Stain     Status: None   Collection Time: 02/19/21  3:36 PM   Specimen: KNEE; Body Fluid  Result Value Ref Range Status   Specimen Description   Final    KNEE JOINT FLUID Performed at [facility], [address], [place], [place] [address]    Special Requests   Final    Normal Performed at [facility], [address] [place], [place] [address]    Gram Stain   Final    NO ORGANISMS SEEN WBC PRESENT, PREDOMINANTLY PMN Performed at [facility], [address], [place], [place] [address]    Culture   Final    FEW PSEUDOMONAS AERUGINOSA CRITICAL RESULT CALLED TO, READ BACK BY AND VERIFIED WITH: RN E.WRIGHT ON [id number] AT 3500 BY E.PARRISH Performed at [facility], [place] [address], [place], [place] [address]    Report Status 02/23/2021 FINAL  Final   Organism ID, Bacteria PSEUDOMONAS AERUGINOSA  Final      Susceptibility   Pseudomonas aeruginosa - MIC*    CEFTAZIDIME 2 SENSITIVE Sensitive     CIPROFLOXACIN <=0.25 SENSITIVE Sensitive     GENTAMICIN <=1 SENSITIVE Sensitive     IMIPENEM 1 SENSITIVE Sensitive     PIP/TAZO <=4 SENSITIVE Sensitive     CEFEPIME 2 SENSITIVE Sensitive     * FEW PSEUDOMONAS AERUGINOSA  Surgical PCR screen     Status: None   Collection Time: 02/23/21  2:36 AM   Specimen: Nasal Mucosa; Nasal Swab  Result Value Ref Range Status   MRSA, PCR NEGATIVE NEGATIVE Final   Staphylococcus aureus NEGATIVE NEGATIVE Final    Comment: (NOTE) The Xpert SA Assay (FDA approved for NASAL specimens in patients 44 years of age and older), is one component of a comprehensive surveillance program. It is not intended to diagnose infection nor to guide or monitor treatment. Performed at [facility], [address] [place], [place] [address]       Studies: No results found.  Scheduled Meds:  acetaminophen  650 mg Oral Q6H   allopurinol  100 mg Oral Daily   apixaban  2.5 mg Oral BID   Chlorhexidine Gluconate  Cloth  6 each Topical Daily   docusate sodium  100 mg Oral  BID   finasteride  5 mg Oral Daily   furosemide  20 mg Intravenous BID   irbesartan  150 mg Oral Daily   metoprolol succinate  50 mg Oral BID   pantoprazole  40 mg Oral QPM   sodium chloride flush  10-40 mL Intracatheter Q12H   tamsulosin  0.4 mg Oral BID    Continuous Infusions:  ceFEPime (MAXIPIME) IV 2 g (02/28/21 0943)     LOS: 9 days     Annita Brod, MD [facility]   02/28/2021, 12:24 PM

## 2021-03-01 ENCOUNTER — Inpatient Hospital Stay (HOSPITAL_COMMUNITY): Payer: Medicare Other

## 2021-03-01 DIAGNOSIS — R4182 Altered mental status, unspecified: Secondary | ICD-10-CM | POA: Diagnosis not present

## 2021-03-01 LAB — COMPREHENSIVE METABOLIC PANEL
ALT: 13 U/L (ref 0–44)
AST: 14 U/L — ABNORMAL LOW (ref 15–41)
Albumin: 2 g/dL — ABNORMAL LOW (ref 3.5–5.0)
Alkaline Phosphatase: 93 U/L (ref 38–126)
Anion gap: 4 — ABNORMAL LOW (ref 5–15)
BUN: 33 mg/dL — ABNORMAL HIGH (ref 8–23)
CO2: 21 mmol/L — ABNORMAL LOW (ref 22–32)
Calcium: 9.1 mg/dL (ref 8.9–10.3)
Chloride: 106 mmol/L (ref 98–111)
Creatinine, Ser: 1.64 mg/dL — ABNORMAL HIGH (ref 0.61–1.24)
GFR, Estimated: 41 mL/min — ABNORMAL LOW (ref >60)
Glucose, Bld: 97 mg/dL (ref 70–99)
Potassium: 3.4 mmol/L — ABNORMAL LOW (ref 3.5–5.1)
Sodium: 131 mmol/L — ABNORMAL LOW (ref 135–145)
Total Bilirubin: 0.4 mg/dL (ref 0.3–1.2)
Total Protein: 5 g/dL — ABNORMAL LOW (ref 6.5–8.1)

## 2021-03-01 LAB — CBC
HCT: 27.1 % — ABNORMAL LOW (ref 39.0–52.0)
Hemoglobin: 8.8 g/dL — ABNORMAL LOW (ref 13.0–17.0)
MCH: 30.7 pg (ref 26.0–34.0)
MCHC: 32.5 g/dL (ref 30.0–36.0)
MCV: 94.4 fL (ref 80.0–100.0)
Platelets: 266 10*3/uL (ref 150–400)
RBC: 2.87 MIL/uL — ABNORMAL LOW (ref 4.22–5.81)
RDW: 14.4 % (ref 11.5–15.5)
WBC: 13.6 10*3/uL — ABNORMAL HIGH (ref 4.0–10.5)
nRBC: 0 % (ref 0.0–0.2)

## 2021-03-01 LAB — AMMONIA: Ammonia: 13 umol/L (ref 9–35)

## 2021-03-01 LAB — BLOOD GAS, ARTERIAL
Acid-base deficit: 6.6 mmol/L — ABNORMAL HIGH (ref 0.0–2.0)
Bicarbonate: 20 mmol/L (ref 20.0–28.0)
FIO2: 21
O2 Saturation: 97.3 %
Patient temperature: 36.8
pCO2 arterial: 27.1 mmHg — ABNORMAL LOW (ref 32.0–48.0)
pH, Arterial: 7.416 (ref 7.350–7.450)
pO2, Arterial: 94.8 mmHg (ref 83.0–108.0)

## 2021-03-01 MED ORDER — HALOPERIDOL LACTATE 5 MG/ML IJ SOLN
1.0000 mg | Freq: Four times a day (QID) | INTRAMUSCULAR | Status: DC | PRN
  Administered 2021-03-01: 1 mg via INTRAVENOUS
  Filled 2021-03-01: qty 1

## 2021-03-01 MED ORDER — KETOROLAC TROMETHAMINE 15 MG/ML IJ SOLN
15.0000 mg | Freq: Once | INTRAMUSCULAR | Status: AC
  Administered 2021-03-01: 15 mg via INTRAVENOUS
  Filled 2021-03-01: qty 1

## 2021-03-01 MED ORDER — DIPHENHYDRAMINE HCL 25 MG PO CAPS
50.0000 mg | ORAL_CAPSULE | Freq: Every day | ORAL | Status: DC
  Administered 2021-03-01: 50 mg via ORAL
  Filled 2021-03-01: qty 2

## 2021-03-01 NOTE — Progress Notes (Signed)
PROGRESS NOTE  Philip Mcclain:[id number] DOB: Jun 05, 1934 DOA: 02/19/2021 PCP: Glenda Chroman, MD  HPI/Recap of past 58 hours: 87 year old with past medical history of pulmonary hypertension, persistent atrial fibrillation on Eliquis [place] and hypertension seen by orthopedics in the outpatient setting and underwent left knee aspiration for steroid injection on June 14 with fluid findings consistent with gout, but also grew out Pseudomonas.  Brought into the emergency room with a swollen left knee and confusion on 6/18 and found to be in severe sepsis from infected left knee joint.  Patient started on IV fluids and antibiotics and admitted to the hospitalist service.  Orthopedics consulted and patient underwent left knee washout on 6/22.  Since then was having some severe knee pain over the next 24 hours and on night of 6/23, developed upper extremity tremors  Initially, knee pain and tremors have both improved, especially with discontinuation of morphine and Oxy IR.  In the last 2 days, patient a bit more confused.  Seems a little more lucid today although having significant knee pain.  Blood pressures have also trended up in the last few days with systolic in the 440N to 027O.  Head CT negative.  This morning, patient a bit more appropriate, but as day progressed, became somnolent and then confused this afternoon.  Hospital course:   Pulmonary HTN [facility]): Clinically compensated.    Essential hypertension: Blood pressures have been trending upward.  Have restarted patient's Lasix.  Has responded with significant diuresis and has diuresed over 9 L since hospitalization.  We will give additional Lasix given elevated blood pressures.  Adding as needed hydralazine.      Persistent atrial fibrillation ([place]): Rate controlled.  Eliquis restarted.  Acute delirium: Doubtful on infection given declining procalcitonin and no fever although white count minimally higher than previous day.  That said, already on  cefepime.  Not from medication.  CT scan of head negative.  No signs of additional infection.  Checking the CT.  Urinalysis unremarkable.  Have consulted neurology.  I suspect that initially confusion may have been from stronger pain medications: Morphine/Oxy IR and have since stopped those and outpatient is having hospital delirium with sleeping during day and awake at night.  He takes Benadryl at night at home so we will restart this and try to maximize daylight and lights on during day and minimize sleeping and hopefully he will recover.  Some mildly improved today.  Severe pseudomonal sepsis from septic joint of left knee joint [facility]): Patient met criteria for severe sepsis given tachycardia, tachypnea, leukocytosis with acute kidney injury, lactic acidosis, present on admission.  Sepsis stabilized.  Lactic acid now normalized.  Going for knee washout today.  Continue antibiotics.  Previous cultures grew out Pseudomonas and cultures from washout grew out sensitive Pseudomonas added Toradol x1 for knee pain in addition to his other pain medications.  Discussed with infectious disease who recommend 4 weeks of IV antibiotics.  PICC line placed 6/25.  Infectious disease following.  Patient to be on cefepime 2 g twice a day for the next 3 weeks for total of 4 weeks.  Given white blood cell count that was trending up until today and increased confusion and knee pain, checking CT scan of knee.  AKI in the setting of stage IIIb chronic kidney disease: Patient on admission with creatinine of 2.16, due to sepsis.  With IV fluids, has improved.  Creatinine staying around 1.8 with GFR 37, much closer to patient's baseline.  Tremors:  Unclear etiology.  Possibly from morphine so this was discontinued.  Resolved    BPH with obstruction/lower urinary tract symptoms    Toxic metabolic encephalopathy: Initially due to to sepsis.  Improved with fluids and antibiotics.  Now recovering.  See above.  Acute hyponatremia:  Secondary to hypovolemia.  Much improved with fluids.  Now mild.   Code Status: DNR  Family Communication: Updated daughter by phone  Disposition Plan: Patient does not want to go to skilled nursing.  However daughter feels that he would not be safe going home, so she will work to convince him to go.  He will need continued PICC line and IV antibiotics for another 3 weeks  Consultants: Orthopedic surgery  Procedures: Left knee washout 6/22 Placement of PICC line 6/25  Antimicrobials: Vancomycin and Flagyl 6/18 only Cefepime 6/18-7/16  DVT prophylaxis: Will resume Eliquis  Level of care: Telemetry   Objective: Vitals:   03/01/21 0009 03/01/21 1000  BP: (!) 160/77   Pulse: 76   Resp:    Temp:    SpO2:  96%    Intake/Output Summary (Last 24 hours) at 03/01/2021 1526 Last data filed at 03/01/2021 0516 Gross per 24 hour  Intake 540 ml  Output 500 ml  Net 40 ml    Filed Weights   02/19/21 1248 02/23/21 1223  Weight: 79.8 kg 79.8 kg   Body mass index is 24.54 kg/m.  Exam:  General: Somnolent Cardiovascular: Regular rate and rhythm, S1-S2 Respiratory: Clear to auscultation bilaterally Abdomen: Soft, nontender, nondistended, positive bowel sounds Musculoskeletal: No clubbing or cyanosis, trace pitting edema.  Left knee bandaged, tender, swollen, drain noted. Skin: No skin breaks, tears or lesions Psychiatry: Somnolent, but confused during waking times Neurology: No focal deficits   Data Reviewed: CBC: Recent Labs  Lab 02/25/21 0502 02/26/21 0854 02/27/21 0336 02/28/21 0529 03/01/21 0504  WBC 14.3* 14.2* 15.8* 17.1* 13.6*  HGB 8.2* 9.5* 9.5* 9.9* 8.8*  HCT 26.3* 29.5* 29.6* 29.7* 27.1*  MCV 100.4* 97.7 96.7 94.6 94.4  PLT 295 312 335 308 017    Basic Metabolic Panel: Recent Labs  Lab 02/25/21 0502 02/26/21 0854 02/27/21 0336 02/28/21 0529 03/01/21 0504  NA 133* 133* 133* 132* 131*  K 4.5 4.1 4.1 3.8 3.4*  CL 111 109 107 108 106  CO2 19* 18*  18* 19* 21*  GLUCOSE 92 98 108* 107* 97  BUN 42* 40* 39* 34* 33*  CREATININE 1.88* 1.81* 1.79* 1.67* 1.64*  CALCIUM 8.7* 9.1 8.9 9.2 9.1    GFR: Estimated Creatinine Clearance: 35.7 mL/min (A) (by C-G formula based on SCr of 1.64 mg/dL (H)). Liver Function Tests: Recent Labs  Lab 02/24/21 0357 03/01/21 0504  AST 13* 14*  ALT 15 13  ALKPHOS 80 93  BILITOT 0.8 0.4  PROT 5.1* 5.0*  ALBUMIN 2.1* 2.0*    No results for input(s): LIPASE, AMYLASE in the last 168 hours. No results for input(s): AMMONIA in the last 168 hours.  Coagulation Profile: No results for input(s): INR, PROTIME in the last 168 hours.  Cardiac Enzymes: No results for input(s): CKTOTAL, CKMB, CKMBINDEX, TROPONINI in the last 168 hours.  BNP (last 3 results) No results for input(s): PROBNP in the last 8760 hours. HbA1C: No results for input(s): HGBA1C in the last 72 hours. CBG: No results for input(s): GLUCAP in the last 168 hours. Lipid Profile: No results for input(s): CHOL, HDL, LDLCALC, TRIG, CHOLHDL, LDLDIRECT in the last 72 hours. Thyroid Function Tests: No results for input(s):  TSH, T4TOTAL, FREET4, T3FREE, THYROIDAB in the last 72 hours. Anemia Panel: No results for input(s): VITAMINB12, FOLATE, FERRITIN, TIBC, IRON, RETICCTPCT in the last 72 hours.  Urine analysis:    Component Value Date/Time   COLORURINE YELLOW 02/28/2021 1020   APPEARANCEUR CLEAR 02/28/2021 1020   APPEARANCEUR Clear 01/17/2021 1342   LABSPEC 1.011 02/28/2021 1020   PHURINE 5.0 02/28/2021 1020   GLUCOSEU NEGATIVE 02/28/2021 1020   HGBUR MODERATE (A) 02/28/2021 1020   BILIRUBINUR NEGATIVE 02/28/2021 1020   BILIRUBINUR Negative 01/17/2021 [place] 02/28/2021 1020   PROTEINUR 30 (A) 02/28/2021 1020   NITRITE NEGATIVE 02/28/2021 1020   LEUKOCYTESUR NEGATIVE 02/28/2021 1020   Sepsis Labs: $[email or web address]$ (procalcitonin:4,lacticidven:4)  ) Recent Results (from the past 240 hour(s))  Body fluid culture w  Gram Stain     Status: None   Collection Time: 02/19/21  3:36 PM   Specimen: KNEE; Body Fluid  Result Value Ref Range Status   Specimen Description   Final    KNEE JOINT FLUID Performed at [facility], [address], [place], [place] [address]    Special Requests   Final    Normal Performed at [facility], [address], [place], [place] [address]    Gram Stain   Final    NO ORGANISMS SEEN WBC PRESENT, PREDOMINANTLY PMN Performed at [facility], [address] [place], [place] [address]    Culture   Final    FEW PSEUDOMONAS AERUGINOSA CRITICAL RESULT CALLED TO, READ BACK BY AND VERIFIED WITH: RN E.WRIGHT ON [id number] AT [id number] BY E.PARRISH Performed at [facility], [place] [address], [place], [place] [address]    Report Status 02/23/2021 FINAL  Final   Organism ID, Bacteria PSEUDOMONAS AERUGINOSA  Final      Susceptibility   Pseudomonas aeruginosa - MIC*    CEFTAZIDIME 2 SENSITIVE Sensitive     CIPROFLOXACIN <=0.25 SENSITIVE Sensitive     GENTAMICIN <=1 SENSITIVE Sensitive     IMIPENEM 1 SENSITIVE Sensitive     PIP/TAZO <=4 SENSITIVE Sensitive     CEFEPIME 2 SENSITIVE Sensitive     * FEW PSEUDOMONAS AERUGINOSA  Surgical PCR screen     Status: None   Collection Time: 02/23/21  2:36 AM   Specimen: Nasal Mucosa; Nasal Swab  Result Value Ref Range Status   MRSA, PCR NEGATIVE NEGATIVE Final   Staphylococcus aureus NEGATIVE NEGATIVE Final    Comment: (NOTE) The Xpert SA Assay (FDA approved for NASAL specimens in patients 100 years of age and older), is one component of a comprehensive surveillance program. It is not intended to diagnose infection nor to guide or monitor treatment. Performed at [facility], [address] [place], [place] [address]       Studies: CT HEAD WO CONTRAST  Result Date: 02/28/2021 CLINICAL DATA:  Mental status changes of unknown cause. Acute delirium. EXAM: CT HEAD WITHOUT CONTRAST TECHNIQUE: Contiguous axial images were obtained from the  base of the skull through the vertex without intravenous contrast. COMPARISON:  02/19/2021 FINDINGS: Brain: Age related volume loss. Chronic small-vessel ischemic changes of the cerebral hemispheric white matter. No sign of acute infarction, mass lesion, hemorrhage, hydrocephalus or extra-axial collection. Vascular: There is atherosclerotic calcification of the major vessels at the base of the brain. Skull: Negative Sinuses/Orbits: Chronic right maxillary sinusitis.  Orbits negative. Other: None IMPRESSION: No acute intracranial finding. Atrophy and chronic small-vessel ischemic changes of the white matter Chronic right maxillary sinusitis. Electronically Signed   By:  Nelson Chimes M.D.   On: 02/28/2021 19:44    Scheduled Meds:  acetaminophen  650 mg Oral Q6H   allopurinol  100 mg Oral Daily   apixaban  2.5 mg Oral BID   Chlorhexidine Gluconate Cloth  6 each Topical Daily   docusate sodium  100 mg Oral BID   finasteride  5 mg Oral Daily   furosemide  20 mg Intravenous BID   irbesartan  150 mg Oral Daily   metoprolol succinate  50 mg Oral BID   pantoprazole  40 mg Oral QPM   sodium chloride flush  10-40 mL Intracatheter Q12H   tamsulosin  0.4 mg Oral BID    Continuous Infusions:  ceFEPime (MAXIPIME) IV 2 g (03/01/21 0843)     LOS: 10 days     Annita Brod, MD [facility]   03/01/2021, 3:26 PM

## 2021-03-01 NOTE — Progress Notes (Signed)
Patient with some confusion this morning. He did know that he had to have a BM, and was aware he was dirty. Patient pleasant. Refilled patient's ice man for his knee. Flushed patient's single lumen PICC. Daughter was at bedside early this am but has gone to work. Neurology consult placed in the book.

## 2021-03-01 NOTE — TOC Progression Note (Signed)
Transition of Care [facility]) - Progression Note    Patient Details  Name: Philip Mcclain MRN: [id number] Date of Birth: 11-24-1933  [facility] CM/SW Contact  Salome Arnt, [place] Phone Number: 03/01/2021, 10:23 AM  Clinical Narrative:  LCSW followed up with pt's daughter, Sharyn Lull regarding d/c plan as pt has been hallucinating this morning per RN. Sharyn Lull states she is Economist and is faxing over paperwork. Sharyn Lull feels pt will require SNF. Bed offers provided and she chooses [facility]. Facility notified. Pam with [facility] also aware of d/c plan. Awaiting stability for d/c.      Expected Discharge Plan: [place] Barriers to Discharge: Continued Medical Work up  Expected Discharge Plan and Services Expected Discharge Plan: [place] Choice: [place] arrangements for the past 2 months: Single Family Home                                       Social Determinants of Health (SDOH) Interventions    Readmission Risk Interventions No flowsheet data found.
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
## 2021-03-01 NOTE — Progress Notes (Signed)
While this nurse was at lunch, patient pulled his ice man apart and apparently became agitated.Staff assisted in cleaning the patient and the room. Writer returned to room to assist and patient given medication as ordered for agitation, as well as pain medication which he called for after his physician removed his dressing to his knee. Provided to patient. He continued to pull at gown and lines, concern for PICC line, so mittens were placed and medical team notified.

## 2021-03-01 NOTE — Progress Notes (Signed)
Physical Therapy Treatment Patient Details Name: Philip Mcclain MRN: [id number] DOB: 02-15-1934 Today's Date: 03/01/2021    History of Present Illness Pt is an 87 yo s/p left knee washout/lavage on 6/22. Pt brought into the emergency room with a swollen left knee and confusion on 6/18 and found to be in severe sepsis from infected left knee joint. Of note, pt seen by orthopedics in the outpatient setting and underwent left knee aspiration for steroid injection on June 14 with fluid findings consistent with gout, but also grew out Pseudomonas. PMH: pulmonary hypertension, afib, HTN    PT Comments    Pt assisted to EOB, attempted STS, but requires max A for squatted standing and requests to return to sitting after ~5 seconds. Pt noted to have BM so assisted back to supine and nursing notified. Pt rolling R/L requiring min assist to turn. Pt requires increased time, significant verbal cues, multimodal cues at times, unsure if due to [facility] or cognition and no family present during session. Pt moaning in pain with L knee mobility, overall limited by L knee pain. Will continue acute PT as able.   Follow Up Recommendations  SNF;Supervision for mobility/OOB;Supervision/Assistance - 24 hour     Equipment Recommendations       Recommendations for Other Services       Precautions / Restrictions Precautions Precautions: Fall Restrictions Weight Bearing Restrictions: No LLE Weight Bearing: Weight bearing as tolerated Other Position/Activity Restrictions: PROM/AROM Left knee    Mobility  Bed Mobility Overal bed mobility: Needs Assistance Bed Mobility: Supine to Sit;Sit to Supine;Rolling Rolling: Min assist  Supine to sit: Mod assist;HOB elevated Sit to supine: Mod assist   General bed mobility comments: mod A to lift trunk into sitting with therapist also assiting BLE over to EOB, pt able to inch RLE over to EOB and lift back into bed; mod A to lift LLE Back into bed; mod A with bed assist to  scoot pt up in bed using bed pad, pt cued for hooklying position and able to assist with RLE only; min A rollying R/L for pericare and linen change    Transfers Overall transfer level: Needs assistance Equipment used: 1 person hand held assist Transfers: Sit to/from Stand Sit to Stand: Max assist    General transfer comment: max A to power to stand with +1 assist, only able to achieve squatted position and clear bottom ~2 inches from bed, pt very painful, noted BM so assisted back to supine  Ambulation/Gait  General Gait Details: not attempted   Stairs             Wheelchair Mobility    Modified Rankin (Stroke Patients Only)       Balance Overall balance assessment: Needs assistance Sitting-balance support: Feet supported;Bilateral upper extremity supported Sitting balance-Leahy Scale: Poor Sitting balance - Comments: reliant on UE support while seated EOB     Standing balance-Leahy Scale: Zero Standing balance comment: total A in squatted stand position for ~5 seconds       Cognition Arousal/Alertness: Awake/alert Behavior During Therapy: WFL for tasks assessed/performed Overall Cognitive Status: No family/caregiver present to determine baseline cognitive functioning  General Comments: Pt very HOH, requires increased time with commands, multimodal cues for sequencing      Exercises General Exercises - Lower Extremity Ankle Circles/Pumps: Supine;AAROM;Both;20 reps    General Comments        Pertinent Vitals/Pain Pain Assessment: Faces Faces Pain Scale: Hurts worst Pain Location: L knee Pain Descriptors / Indicators:  Grimacing;Guarding;Moaning Pain Intervention(s): Limited activity within patient's tolerance;Monitored during session;Premedicated before session;Repositioned;Ice applied    Home Living                      Prior Function            PT Goals (current goals can now be found in the care plan section) Acute Rehab PT  Goals Patient Stated Goal: Go home with help from family. PT Goal Formulation: With patient Time For Goal Achievement: 03/10/21 Potential to Achieve Goals: Fair Progress towards PT goals: Not progressing toward goals - comment (limited 2* pain)    Frequency    Min 3X/week      PT Plan Current plan remains appropriate    Co-evaluation              AM-PAC PT "6 Clicks" Mobility   Outcome Measure  Help needed turning from your back to your side while in a flat bed without using bedrails?: A Little Help needed moving from lying on your back to sitting on the side of a flat bed without using bedrails?: A Lot Help needed moving to and from a bed to a chair (including a wheelchair)?: Total Help needed standing up from a chair using your arms (e.g., wheelchair or bedside chair)?: Total Help needed to walk in hospital room?: Total Help needed climbing 3-5 steps with a railing? : Total 6 Click Score: 9    End of Session Equipment Utilized During Treatment: Gait belt Activity Tolerance: Patient limited by pain Patient left: in bed;with call bell/phone within reach;with bed alarm set Nurse Communication: Mobility status;Other (comment) (BM) PT Visit Diagnosis: Unsteadiness on feet (R26.81);Other abnormalities of gait and mobility (R26.89);Muscle weakness (generalized) (M62.81);Difficulty in walking, not elsewhere classified (R26.2);Pain Pain - Right/Left: Left Pain - part of body: Knee     Time: [id number] PT Time Calculation (min) (ACUTE ONLY): 25 min  Charges:  $Therapeutic Activity: 23-37 mins                      Tori Tashawn Laswell PT, DPT 03/01/21, 10:33 AM

## 2021-03-01 NOTE — Progress Notes (Signed)
Patient ID: Philip Mcclain, male   DOB: [birth year], 87 y.o.   MRN: [id number]   POD 6   SALK LAVAGE;  DRESSING CHANGE - WOUNDS ARE CLEAN; THE ACE WRAP HAS CAUSED THE SWELLING IN THE FOOT AND ANKLE   I V WRAPPED THE LEG TO IMPROVE THE SWELLING

## 2021-03-01 NOTE — Progress Notes (Signed)
Patient arrived back to room from MRI, complaints of pain and having had a BM. Patient cleansed and turned. Provided pain meds as ordered. Patient daughter is at bedside to assist with meal. Drinking and eating well with this assist. He seems more oriented now with her here as well.  Reminder to turn hearing aides down to zero and charge tonight. Some redness to scrotum noted after stool, barrier cream applied.

## 2021-03-02 ENCOUNTER — Telehealth: Payer: Self-pay | Admitting: Orthopedic Surgery

## 2021-03-02 ENCOUNTER — Inpatient Hospital Stay (HOSPITAL_COMMUNITY)
Admit: 2021-03-02 | Discharge: 2021-03-02 | Disposition: A | Discharge status: Home / Self Care | Payer: Medicare Other | Attending: Neurology | Admitting: Neurology

## 2021-03-02 ENCOUNTER — Inpatient Hospital Stay (HOSPITAL_COMMUNITY): Payer: Medicare Other

## 2021-03-02 DIAGNOSIS — R4182 Altered mental status, unspecified: Secondary | ICD-10-CM

## 2021-03-02 DIAGNOSIS — G9341 Metabolic encephalopathy: Secondary | ICD-10-CM

## 2021-03-02 LAB — COMPREHENSIVE METABOLIC PANEL
ALT: 14 U/L (ref 0–44)
AST: 16 U/L (ref 15–41)
Albumin: 2.1 g/dL — ABNORMAL LOW (ref 3.5–5.0)
Alkaline Phosphatase: 98 U/L (ref 38–126)
Anion gap: 6 (ref 5–15)
BUN: 34 mg/dL — ABNORMAL HIGH (ref 8–23)
CO2: 20 mmol/L — ABNORMAL LOW (ref 22–32)
Calcium: 8.8 mg/dL — ABNORMAL LOW (ref 8.9–10.3)
Chloride: 105 mmol/L (ref 98–111)
Creatinine, Ser: 1.84 mg/dL — ABNORMAL HIGH (ref 0.61–1.24)
GFR, Estimated: 36 mL/min — ABNORMAL LOW (ref >60)
Glucose, Bld: 96 mg/dL (ref 70–99)
Potassium: 3.5 mmol/L (ref 3.5–5.1)
Sodium: 131 mmol/L — ABNORMAL LOW (ref 135–145)
Total Bilirubin: 0.5 mg/dL (ref 0.3–1.2)
Total Protein: 5.3 g/dL — ABNORMAL LOW (ref 6.5–8.1)

## 2021-03-02 LAB — CBC
HCT: 26.9 % — ABNORMAL LOW (ref 39.0–52.0)
Hemoglobin: 8.8 g/dL — ABNORMAL LOW (ref 13.0–17.0)
MCH: 31 pg (ref 26.0–34.0)
MCHC: 32.7 g/dL (ref 30.0–36.0)
MCV: 94.7 fL (ref 80.0–100.0)
Platelets: 235 10*3/uL (ref 150–400)
RBC: 2.84 MIL/uL — ABNORMAL LOW (ref 4.22–5.81)
RDW: 14.6 % (ref 11.5–15.5)
WBC: 14 10*3/uL — ABNORMAL HIGH (ref 4.0–10.5)
nRBC: 0 % (ref 0.0–0.2)

## 2021-03-02 LAB — C-REACTIVE PROTEIN: CRP: 5.2 mg/dL — ABNORMAL HIGH (ref <1.0)

## 2021-03-02 LAB — SEDIMENTATION RATE: Sed Rate: 109 mm/hr — ABNORMAL HIGH (ref 0–16)

## 2021-03-02 MED ORDER — POTASSIUM CHLORIDE IN NACL 20-0.9 MEQ/L-% IV SOLN: INTRAVENOUS | Status: AC

## 2021-03-02 MED ORDER — HYDRALAZINE HCL 25 MG PO TABS: 50.0000 mg | ORAL_TABLET | Freq: Four times a day (QID) | ORAL | Status: DC | PRN

## 2021-03-02 NOTE — Progress Notes (Signed)
Patient ID: PAPE YAHR, male   DOB: 21-Jul-1934, 87 y.o.   MRN: [id number]  Discussed with radiology   Most of the areas of possible myositis were in the calf which clinically is consistent with edema from a tight dressing   Also Radiologist entertained that the fluid in the joint is c/w gout

## 2021-03-02 NOTE — Telephone Encounter (Signed)
Patient's daughter Philip Mcclain called and would like to know results of the MRI.  (There is a signed DPR on file)  Would you please call her?  Thanks

## 2021-03-02 NOTE — Progress Notes (Signed)
EEG Completed; Results Pending  

## 2021-03-02 NOTE — Progress Notes (Signed)
Patient ID: Philip Mcclain, male   DOB: 1933-11-20, 87 y.o.   MRN: [id number] I just spoke to Sharyn Lull   I told her I conferred with radiology about the mri of the knee   While myositis is less likely, gout is more likely   CRP down  Wbc down  Procalcitonin normal   Consider steroids for gout

## 2021-03-02 NOTE — Progress Notes (Signed)
PROGRESS NOTE  AVIR DERUITER FXO:[id number] DOB: [birth year] DOA: 02/19/2021 PCP: Glenda Chroman, MD  Brief History:  87 year old with past medical history of pulmonary hypertension, persistent atrial fibrillation on Eliquis [place] and hypertension seen by orthopedics in the outpatient setting and underwent left knee aspiration for steroid injection on June 14 with fluid findings consistent with gout, but also grew out Pseudomonas.  Brought into the emergency room with a swollen left knee and confusion on 6/18 and found to be in severe sepsis from infected left knee joint.  Patient started on IV fluids and antibiotics and admitted to the hospitalist service.  Orthopedics consulted and patient underwent left knee washout on 6/22.  Since then was having some severe knee pain over the next 24 hours and on night of 6/23, developed upper extremity tremors   Initially, knee pain and tremors have both improved, especially with discontinuation of morphine and Oxy IR.  In the last 2 days, patient a bit more confused.  Seems a little more lucid today although having significant knee pain.  Blood pressures have also trended up in the last few days with systolic in the 600K to 599H.  Head CT negative.  This morning, patient a bit more appropriate, but as day progressed, became somnolent and then confused this afternoon.  Assessment/Plan: Severe pseudomonal sepsis from septic joint of left knee joint [facility]):  -Patient met criteria for severe sepsis given tachycardia, tachypnea, leukocytosis with acute kidney injury, lactic acidosis, present on admission.   -Sepsis stabilized.  Lactic acid now normalized.  -knee washout 6/22.  Continue cefepime  Previous cultures grew out Pseudomonas and cultures from washout grew out sensitive Pseudomonas added Toradol x1 for knee pain in addition to his other pain medications.  Discussed with infectious disease who recommend 4 weeks of IV antibiotics.  PICC line placed 6/25.   Infectious disease following.  Patient to be on cefepime 2 g twice a day for the next 3 weeks for total of 4 weeks.   -6/28 MR knee--mod size exudative effusion with synovitis.  Periarticular and subchondral bony edema;  extensive soft tissue edema -CRP 19.4>>5.2  Pulmonary HTN ([place]):  -Clinically compensated.   Essential hypertension:  -Blood pressures have been trending upward.  Have restarted patient's Lasix.  Has responded with significant diuresis and has diuresed over 9 L since hospitalization.  -hold additional lasix with uptrending creatinine -Adding as needed hydralazine.     Persistent atrial fibrillation ([place]):  -Rate controlled.  Eliquis restarted.   Acute toxic/metabolic Encephalopathy -multifactorial including infectious process, hyponatremia, increasing serum creatinine, and anticholinergic side effects -MRI brain neg for acute findings -B12--1453 -folate 14.3 -ammonia 13 -ABG--no hypoxia nor hypercarbia -UA--neg for pyuria -appreciate neurology input -restart IVF   AKI in the setting of stage IIIb chronic kidney disease:  -Patient on admission with creatinine of 2.16, due to sepsis.   -baseline creatinine 1.4-1.5 -hold lasix IV -restart gentle IVF   Tremors: Unclear etiology.   -Possibly from morphine so this was discontinued.  Resolved      BPH with obstruction/lower urinary tract symptoms -continue flomax -urinating fine   Acute hyponatremia:  -Secondary to hypovolemia and poor solute inake.    Status is: Inpatient  Remains inpatient appropriate because:Altered mental status  Dispo: The patient is from: Home              Anticipated d/c is to: Home  Patient currently is not medically stable to d/c.   Difficult to place patient No        Family Communication:   daughter updated 6/29  Consultants:  neurology  Code Status: DNR  DVT Prophylaxis:  apixaban   Procedures: As Listed in Progress Note  Above  Antibiotics: Cefepime 6/19>>>      Subjective: Patient denies fevers, chills, headache, chest pain, dyspnea, nausea, vomiting, diarrhea, abdominal pain, dysuria, hematuria,   Objective: Vitals:   03/01/21 0009 03/01/21 1000 03/01/21 2150 03/02/21 0520  BP: (!) 160/77  126/66 133/66  Pulse: 76  63 67  Resp:   18   Temp:   98.1 F (36.7 C) 97.8 F (36.6 C)  TempSrc:   Oral Oral  SpO2:  96% 99% 98%  Weight:      Height:        Intake/Output Summary (Last 24 hours) at 03/02/2021 1427 Last data filed at 03/02/2021 1027 Gross per 24 hour  Intake 250 ml  Output 650 ml  Net -400 ml   Weight change:  Exam:  General:  Pt is alert, follows commands appropriately, not in acute distress HEENT: No icterus, No thrush, No neck mass, [place]/AT Cardiovascular: RRR, S1/S2, no rubs, no gallops Respiratory: bibasilar crackles. No wheeze Abdomen: Soft/+BS, non tender, non distended, no guarding Extremities: No edema, No lymphangitis, No petechiae, No rashes, no synovitis;  left knee pain, ,mild edema   Data Reviewed: I have personally reviewed following labs and imaging studies Basic Metabolic Panel: Recent Labs  Lab 02/26/21 0854 02/27/21 0336 02/28/21 0529 03/01/21 0504 03/02/21 0036  NA 133* 133* 132* 131* 131*  K 4.1 4.1 3.8 3.4* 3.5  CL 109 107 108 106 105  CO2 18* 18* 19* 21* 20*  GLUCOSE 98 108* 107* 97 96  BUN 40* 39* 34* 33* 34*  CREATININE 1.81* 1.79* 1.67* 1.64* 1.84*  CALCIUM 9.1 8.9 9.2 9.1 8.8*   Liver Function Tests: Recent Labs  Lab 02/24/21 0357 03/01/21 0504 03/02/21 0036  AST 13* 14* 16  ALT $Re'15 13 14  'qSo$ ALKPHOS 80 93 98  BILITOT 0.8 0.4 0.5  PROT 5.1* 5.0* 5.3*  ALBUMIN 2.1* 2.0* 2.1*   No results for input(s): LIPASE, AMYLASE in the last 168 hours. Recent Labs  Lab 03/01/21 1516  AMMONIA 13   Coagulation Profile: No results for input(s): INR, PROTIME in the last 168 hours. CBC: Recent Labs  Lab 02/26/21 0854 02/27/21 0336  02/28/21 0529 03/01/21 0504 03/02/21 0036  WBC 14.2* 15.8* 17.1* 13.6* 14.0*  HGB 9.5* 9.5* 9.9* 8.8* 8.8*  HCT 29.5* 29.6* 29.7* 27.1* 26.9*  MCV 97.7 96.7 94.6 94.4 94.7  PLT 312 335 308 266 235   Cardiac Enzymes: No results for input(s): CKTOTAL, CKMB, CKMBINDEX, TROPONINI in the last 168 hours. BNP: Invalid input(s): POCBNP CBG: No results for input(s): GLUCAP in the last 168 hours. HbA1C: No results for input(s): HGBA1C in the last 72 hours. Urine analysis:    Component Value Date/Time   COLORURINE YELLOW 02/28/2021 1020   APPEARANCEUR CLEAR 02/28/2021 1020   APPEARANCEUR Clear 01/17/2021 1342   LABSPEC 1.011 02/28/2021 1020   PHURINE 5.0 02/28/2021 1020   GLUCOSEU NEGATIVE 02/28/2021 1020   HGBUR MODERATE (A) 02/28/2021 1020   BILIRUBINUR NEGATIVE 02/28/2021 1020   BILIRUBINUR Negative 01/17/2021 [place] 02/28/2021 1020   PROTEINUR 30 (A) 02/28/2021 1020   NITRITE NEGATIVE 02/28/2021 1020   LEUKOCYTESUR NEGATIVE 02/28/2021 1020   Sepsis Labs: $RemoveBefo'@[email or web address]'jvcGdVZaOSH$ (procalcitonin:4,lacticidven:4) )  Recent Results (from the past 240 hour(s))  Surgical PCR screen     Status: None   Collection Time: 02/23/21  2:36 AM   Specimen: Nasal Mucosa; Nasal Swab  Result Value Ref Range Status   MRSA, PCR NEGATIVE NEGATIVE Final   Staphylococcus aureus NEGATIVE NEGATIVE Final    Comment: (NOTE) The Xpert SA Assay (FDA approved for NASAL specimens in patients 67 years of age and older), is one component of a comprehensive surveillance program. It is not intended to diagnose infection nor to guide or monitor treatment. Performed at [facility], [address] [place], [place] [address]      Scheduled Meds:  acetaminophen  650 mg Oral Q6H   allopurinol  100 mg Oral Daily   apixaban  2.5 mg Oral BID   Chlorhexidine Gluconate Cloth  6 each Topical Daily   diphenhydrAMINE  50 mg Oral QHS   docusate sodium  100 mg Oral BID   finasteride  5 mg Oral Daily    furosemide  20 mg Intravenous BID   irbesartan  150 mg Oral Daily   metoprolol succinate  50 mg Oral BID   pantoprazole  40 mg Oral QPM   sodium chloride flush  10-40 mL Intracatheter Q12H   tamsulosin  0.4 mg Oral BID   Continuous Infusions:  ceFEPime (MAXIPIME) IV 2 g (03/02/21 1026)    Procedures/Studies: DG Pelvis 1-2 Views  Result Date: 02/19/2021 CLINICAL DATA:  Fall today EXAM: PELVIS - 1-2 VIEW COMPARISON:  None. FINDINGS: There is no evidence of pelvic fracture or diastasis. No pelvic bone lesions are seen. Atherosclerotic calcification. IMPRESSION: Negative. Electronically Signed   By: Marlan Palau M.D.   On: 02/19/2021 15:50   DG Knee 1-2 Views Left  Result Date: 02/19/2021 CLINICAL DATA:  Left lower leg pain and swelling after fall EXAM: LEFT TIBIA AND FIBULA - 2 VIEW; LEFT KNEE - 1-2 VIEW COMPARISON:  None FINDINGS: Subtle cortical irregularity involving the peripheral aspect of the lateral tibial plateau articular surface suspicious for tibial plateau fracture. There is a large knee joint effusion. Severe tricompartmental osteoarthritis, most pronounced laterally. Ossification within the region of the distal patellar tendon. Remainder of the tibia and fibula are intact. Ankle mortise appears congruent. Advanced degenerative changes within the midfoot. Diffuse soft tissue edema, nonspecific. Extensive severe vascular calcifications. IMPRESSION: 1. Findings suspicious for a non-depressed lateral tibial plateau fracture. 2. Large knee joint effusion, likely hemarthrosis. 3. Severe tricompartmental osteoarthritis of the left knee. Electronically Signed   By: Duanne Guess D.O.   On: 02/19/2021 16:43   DG Tibia/Fibula Left  Result Date: 02/19/2021 CLINICAL DATA:  Left lower leg pain and swelling after fall EXAM: LEFT TIBIA AND FIBULA - 2 VIEW; LEFT KNEE - 1-2 VIEW COMPARISON:  None FINDINGS: Subtle cortical irregularity involving the peripheral aspect of the lateral tibial plateau  articular surface suspicious for tibial plateau fracture. There is a large knee joint effusion. Severe tricompartmental osteoarthritis, most pronounced laterally. Ossification within the region of the distal patellar tendon. Remainder of the tibia and fibula are intact. Ankle mortise appears congruent. Advanced degenerative changes within the midfoot. Diffuse soft tissue edema, nonspecific. Extensive severe vascular calcifications. IMPRESSION: 1. Findings suspicious for a non-depressed lateral tibial plateau fracture. 2. Large knee joint effusion, likely hemarthrosis. 3. Severe tricompartmental osteoarthritis of the left knee. Electronically Signed   By: Duanne Guess D.O.   On: 02/19/2021 16:43   CT HEAD WO CONTRAST  Result Date: 02/28/2021 CLINICAL DATA:  Mental status changes of unknown cause. Acute delirium. EXAM: CT HEAD WITHOUT CONTRAST TECHNIQUE: Contiguous axial images were obtained from the base of the skull through the vertex without intravenous contrast. COMPARISON:  02/19/2021 FINDINGS: Brain: Age related volume loss. Chronic small-vessel ischemic changes of the cerebral hemispheric white matter. No sign of acute infarction, mass lesion, hemorrhage, hydrocephalus or extra-axial collection. Vascular: There is atherosclerotic calcification of the major vessels at the base of the brain. Skull: Negative Sinuses/Orbits: Chronic right maxillary sinusitis.  Orbits negative. Other: None IMPRESSION: No acute intracranial finding. Atrophy and chronic small-vessel ischemic changes of the white matter Chronic right maxillary sinusitis. Electronically Signed   By: Nelson Chimes M.D.   On: 02/28/2021 19:44   CT Head Wo Contrast  Result Date: 02/19/2021 CLINICAL DATA:  Head trauma, loss of consciousness.  Altered, falls. EXAM: CT HEAD WITHOUT CONTRAST TECHNIQUE: Contiguous axial images were obtained from the base of the skull through the vertex without intravenous contrast. COMPARISON:  Brain MRI 08/19/2020.   Head CT 08/19/2020. FINDINGS: Brain: Mild generalized cerebral atrophy. Moderate patchy and ill-defined hypoattenuation within the cerebral white matter, nonspecific but compatible with chronic small vessel ischemic disease. There is no acute intracranial hemorrhage. No demarcated cortical infarct. No extra-axial fluid collection. No evidence of intracranial mass. No midline shift. Vascular: No hyperdense vessel.  Atherosclerotic calcifications. Skull: Normal. Negative for fracture or focal lesion. Sinuses/Orbits: Visualized orbits show no acute finding. Fairly extensive partial opacification of the right maxillary sinus. Trace left maxillary sinus mucosal thickening. IMPRESSION: No evidence of acute intracranial abnormality. Moderate cerebral white matter chronic small vessel ischemic disease. Mild generalized parenchymal atrophy. Chronic right maxillary sinusitis. Electronically Signed   By: Kellie Simmering DO   On: 02/19/2021 14:12   MR BRAIN WO CONTRAST  Result Date: 03/02/2021 CLINICAL DATA:  Altered mental status EXAM: MRI HEAD WITHOUT CONTRAST TECHNIQUE: Multiplanar, multiecho pulse sequences of the brain and surrounding structures were obtained without intravenous contrast. COMPARISON:  December 2021 FINDINGS: Motion artifact is present. Brain: There is no acute infarction or intracranial hemorrhage. There is no intracranial mass, mass effect, or edema. There is no hydrocephalus or extra-axial fluid collection. Ventricles and sulci are stable in size and configuration. Patchy and confluent areas of T2 hyperintensity in the supratentorial white matter likely reflects stable chronic microvascular ischemic changes. Focus of susceptibility is again identified in the right cerebellum along the fourth ventricle most compatible with chronic microhemorrhage or occult cavernous malformation. Vascular: Major vessel flow voids at the skull base are preserved. Skull and upper cervical spine: Normal marrow signal is  preserved. Sinuses/Orbits: Chronic right maxillary sinusitis. Bilateral lens replacements. Other: Sella is unremarkable.  Mastoid air cells are clear. IMPRESSION: Motion degradation. No evidence of recent infarction, hemorrhage, or mass. Stable chronic findings detailed above. Electronically Signed   By: Macy Mis M.D.   On: 03/02/2021 09:24   MR KNEE LEFT WO CONTRAST  Result Date: 03/01/2021 CLINICAL DATA:  Knee replacement, known infection s/p knee washout, increased knee pain, worsening confusion and white blood cell ct, EXAM: MRI OF THE LEFT KNEE WITHOUT CONTRAST TECHNIQUE: Multiplanar, multisequence MR imaging of the knee was performed. No intravenous contrast was administered. COMPARISON:  Left knee radiograph 02/19/2021 FINDINGS: Severe image degradation due to motion artifact. MENISCI Medial: Macerated appearance of the anterior horn and body of the medial meniscus with multidirectional tearing in the posterior horn. Lateral: Macerated anterior horn of the lateral meniscus with likely torn meniscal root and mild extrusion. LIGAMENTS Cruciates: The ACL  is not well visualized and possibly chronically torn. Intact PCL. Collaterals: Medial collateral ligament is intact. Lateral collateral ligament complex is intact. CARTILAGE Patellofemoral:  Moderate-severe chondrosis. Medial:  Moderate-severe chondrosis Lateral: Severe chondrosis with full-thickness cartilage loss along the weight-bearing surfaces. JOINT: Moderate-sized exudative appearing joint effusion with synovitis. POPLITEAL FOSSA: Moderate size Baker cyst. EXTENSOR MECHANISM: Intact quadriceps tendon. Intact patellar tendon. Intact lateral patellar retinaculum. Intact medial patellar retinaculum. Intact MPFL. BONES: Periarticular, subchondral bony edema favored to be related to severe degenerative arthritis. Other: Extensive soft tissue swelling along the knee including edema signal within the visualized musculature. IMPRESSION: Moderate-sized  exudative appearing joint effusion with synovitis. Periarticular, subchondral bony edema, favored to be due to severe degenerative arthritis, though early changes of osteomyelitis is possible. Extensive soft tissue swelling along the knee including within the visualized musculature consistent with myositis. Severe tricompartment osteoarthritis, worst in the lateral compartment with essentially full-thickness cartilage loss along the weight-bearing surfaces. Macerated appearance of the anterior horn and body of the medial meniscus with multidirectional degenerative tearing in the posterior horn. Macerated anterior horn of the lateral meniscus with torn meniscal root and mild extrusion. Poorly visualized ACL, possibly chronically torn, correlate for instability. Moderate size Baker cyst. Electronically Signed   By: Maurine Simmering   On: 03/01/2021 19:54   DG CHEST PORT 1 VIEW  Result Date: 02/26/2021 CLINICAL DATA:  Evaluate PICC line placement EXAM: PORTABLE CHEST 1 VIEW COMPARISON:  February 19, 2021 FINDINGS: A new right PICC line terminates in the central SVC. No pneumothorax. Stable cardiomegaly. No other changes. IMPRESSION: The new right PICC line terminates in the central SVC. No pneumothorax. No other changes. Electronically Signed   By: Dorise Bullion III M.D   On: 02/26/2021 15:22   DG Chest Port 1 View  Result Date: 02/19/2021 CLINICAL DATA:  Question sepsis EXAM: PORTABLE CHEST 1 VIEW COMPARISON:  12/05/2009. FINDINGS: The heart size and mediastinal contours are within normal limits. Atherosclerotic calcification aortic arch. Both lungs are clear. Degenerative change in both shoulders. IMPRESSION: No active disease. Electronically Signed   By: Franchot Gallo M.D.   On: 02/19/2021 14:25   [place] EKG SITE RITE  Result Date: 02/26/2021 If Site Rite image not attached, placement could not be confirmed due to current cardiac rhythm.   Orson Eva, DO  [facility]  If 7PM-7AM, please contact  night-coverage [email or web address] Password [email or web address] 03/02/2021, 2:27 PM   LOS: 11 days

## 2021-03-02 NOTE — Procedures (Signed)
Patient Name: Philip Mcclain  MRN: [id number]  Epilepsy Attending: Lora Havens  Referring Physician/Provider: Dr Phillips Odor Date: 03/02/2021 Duration: 22.50 mins  Patient history: 87yo M with ams. EEG to evaluate for seizure  Level of alertness: Awake  AEDs during EEG study: None  Technical aspects: This EEG study was done with scalp electrodes positioned according to the 10-20 International system of electrode placement. Electrical activity was acquired at a sampling rate of '500Hz'$  and reviewed with a high frequency filter of '70Hz'$  and a low frequency filter of '1Hz'$ . EEG data were recorded continuously and digitally stored.   Description: EEG showed continuous generalized 3 to 6 Hz theta-delta slowing. Intermittent generalized periodic discharges with triphasic morphology at  '1Hz'$  were also noted. Hyperventilation and photic stimulation were not performed.     ABNORMALITY - Periodic discharges with triphasic morphology, generalized ( GPDs) - Continuous slow, generalized  IMPRESSION: This study is suggestive of moderate diffuse encephalopathy, nonspecific etiology but likely related to toxic-metabolic causes. No seizures or definite epileptiform discharges were seen throughout the recording.   Murl Zogg Barbra Sarks
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
## 2021-03-03 LAB — HEPATIC FUNCTION PANEL
ALT: 12 U/L (ref 0–44)
AST: 15 U/L (ref 15–41)
Albumin: 2 g/dL — ABNORMAL LOW (ref 3.5–5.0)
Alkaline Phosphatase: 94 U/L (ref 38–126)
Bilirubin, Direct: 0.1 mg/dL (ref 0.0–0.2)
Indirect Bilirubin: 0.4 mg/dL (ref 0.3–0.9)
Total Bilirubin: 0.5 mg/dL (ref 0.3–1.2)
Total Protein: 5.2 g/dL — ABNORMAL LOW (ref 6.5–8.1)

## 2021-03-03 LAB — BASIC METABOLIC PANEL
Anion gap: 5 (ref 5–15)
BUN: 34 mg/dL — ABNORMAL HIGH (ref 8–23)
CO2: 21 mmol/L — ABNORMAL LOW (ref 22–32)
Calcium: 8.9 mg/dL (ref 8.9–10.3)
Chloride: 105 mmol/L (ref 98–111)
Creatinine, Ser: 2.01 mg/dL — ABNORMAL HIGH (ref 0.61–1.24)
GFR, Estimated: 32 mL/min — ABNORMAL LOW (ref >60)
Glucose, Bld: 92 mg/dL (ref 70–99)
Potassium: 3.6 mmol/L (ref 3.5–5.1)
Sodium: 131 mmol/L — ABNORMAL LOW (ref 135–145)

## 2021-03-03 LAB — MAGNESIUM: Magnesium: 1.5 mg/dL — ABNORMAL LOW (ref 1.7–2.4)

## 2021-03-03 LAB — OCCULT BLOOD X 1 CARD TO LAB, STOOL: Fecal Occult Bld: POSITIVE — AB

## 2021-03-03 LAB — HEMOGLOBIN AND HEMATOCRIT, BLOOD
HCT: 26.7 % — ABNORMAL LOW (ref 39.0–52.0)
Hemoglobin: 8.6 g/dL — ABNORMAL LOW (ref 13.0–17.0)

## 2021-03-03 MED ORDER — MAGNESIUM SULFATE 2 GM/50ML IV SOLN
2.0000 g | Freq: Once | INTRAVENOUS | Status: AC
  Administered 2021-03-03: 2 g via INTRAVENOUS
  Filled 2021-03-03: qty 50

## 2021-03-03 NOTE — Progress Notes (Signed)
Nurse called due to suspicion for GI bleed.  FOBT was done and was positive.  H/H was ordered and pending.

## 2021-03-03 NOTE — Progress Notes (Signed)
PROGRESS NOTE  Philip Mcclain:[id number] DOB: 1934-05-08 DOA: 02/19/2021 PCP: Glenda Chroman, MD  Brief History:  87 year old with past medical history of pulmonary hypertension, persistent atrial fibrillation on Eliquis [place] and hypertension seen by orthopedics in the outpatient setting and underwent left knee aspiration for steroid injection on June 14 with fluid findings consistent with gout, but also grew out Pseudomonas.  Brought into the emergency room with a swollen left knee and confusion on 6/18 and found to be in severe sepsis from infected left knee joint.  Patient started on IV fluids and antibiotics and admitted to the hospitalist service.  Orthopedics consulted and patient underwent left knee washout on 6/22.  Since then was having some severe knee pain over the next 24 hours and on night of 6/23, developed upper extremity tremors   Initially, knee pain and tremors have both improved, especially with discontinuation of morphine and Oxy IR.  In the last 2 days, patient a bit more confused.  Seems a little more lucid today although having significant knee pain.  Blood pressures have also trended up in the last few days with systolic in the 628B to 151V.  Head CT negative.  This morning, patient a bit more appropriate, but as day progressed, became somnolent and then confused this afternoon.   Assessment/Plan: Severe pseudomonal sepsis from septic joint of left knee joint [facility]):  -Patient met criteria for severe sepsis given tachycardia, tachypnea, leukocytosis with acute kidney injury, lactic acidosis, present on admission.   -Sepsis stabilized.  Lactic acid now normalized.  -knee washout 6/22.  Continue cefepime  Previous cultures grew out Pseudomonas and cultures from washout grew out sensitive Pseudomonas added Toradol x1 for knee pain in addition to his other pain medications.  Discussed with infectious disease who recommend 4 weeks of IV antibiotics.  PICC line placed 6/25.   Infectious disease following.  Patient to be on cefepime 2 g twice a day for the next 3 weeks for total of 4 weeks.   -6/28 MR knee--mod size exudative effusion with synovitis.  Periarticular and subchondral bony edema;  extensive soft tissue edema -CRP 19.4>>5.2   Pulmonary HTN ([place]):  -Clinically compensated.   Essential hypertension:  -Blood pressures have been trending upward.  Have restarted patient's Lasix.  Has responded with significant diuresis  -hold additional lasix with uptrending creatinine and does not appear volume overloaded -Adding as needed hydralazine.     Persistent atrial fibrillation ([place]):  -Rate controlled.  -hold apixaban temporarily-->heme positive stool   Acute toxic/metabolic Encephalopathy -multifactorial including infectious process, hyponatremia, increasing serum creatinine, and anticholinergic side effects -MRI brain neg for acute findings -B12--1453 -folate 14.3 -ammonia 13 -ABG--no hypoxia nor hypercarbia -UA--neg for pyuria -appreciate neurology input -restart IVF  Heme Positive stool -unclear if RN actually saw any hematochezia or melena -GI consult  -hold apixaban temporarily   AKI in the setting of stage IIIb chronic kidney disease:  -Patient on admission with creatinine of 2.16, due to sepsis.   -baseline creatinine 1.4-1.5 -hold lasix IV -restart gentle IVF   Tremors -Possibly from morphine so this was discontinued.  Resolved      BPH  -continue flomax -urinating fine   Acute hyponatremia:  -Secondary to hypovolemia and poor solute inake.      Status is: Inpatient   Remains inpatient appropriate because:Altered mental status   Dispo: The patient is from: Home  Anticipated d/c is to: Home              Patient currently is not medically stable to d/c.              Difficult to place patient No               Family Communication:   daughter updated 6/30   Consultants:  neurology, ortho   Code Status:  DNR   DVT Prophylaxis:  apixaban     Procedures: As Listed in Progress Note Above   Antibiotics: Cefepime 6/19>>>    Total time spent 35 minutes.  Greater than 50% spent face to face counseling and coordinating care.   Subjective:  More alert, but remains confused.  Denies cp, sob, headache.  Remainder ROS not obtainable. Objective: Vitals:   03/02/21 0520 03/02/21 2057 03/03/21 0527 03/03/21 0843  BP: 133/66 (!) 146/70 (!) 147/74 99/66  Pulse: 67 69 69 65  Resp:  16 17   Temp: 97.8 F (36.6 C) 98.6 F (37 C) 98.7 F (37.1 C)   TempSrc: Oral Oral    SpO2: 98% 96% 100%   Weight:      Height:        Intake/Output Summary (Last 24 hours) at 03/03/2021 1407 Last data filed at 03/03/2021 1300 Gross per 24 hour  Intake 960 ml  Output 1500 ml  Net -540 ml   Weight change:  Exam:  General:  Pt is alert, follows commands appropriately, not in acute distress HEENT: No icterus, No thrush, No neck mass, [place]/AT Cardiovascular: IRRR, S1/S2, no rubs, no gallops Respiratory: bibasilar crackles. No wheeze Abdomen: Soft/+BS, non tender, non distended, no guarding Extremities: No edema, No lymphangitis, No petechiae, No rashes, no synovitis;  left leg in wrap   Data Reviewed: I have personally reviewed following labs and imaging studies Basic Metabolic Panel: Recent Labs  Lab 02/27/21 0336 02/28/21 0529 03/01/21 0504 03/02/21 0036 03/03/21 0421  NA 133* 132* 131* 131* 131*  K 4.1 3.8 3.4* 3.5 3.6  CL 107 108 106 105 105  CO2 18* 19* 21* 20* 21*  GLUCOSE 108* 107* 97 96 92  BUN 39* 34* 33* 34* 34*  CREATININE 1.79* 1.67* 1.64* 1.84* 2.01*  CALCIUM 8.9 9.2 9.1 8.8* 8.9  MG  --   --   --   --  1.5*   Liver Function Tests: Recent Labs  Lab 03/01/21 0504 03/02/21 0036 03/03/21 0421  AST 14* 16 15  ALT _0 ALKPHOS 93 98 94  BILITOT 0.4 0.5 0.5  PROT 5.0* 5.3* 5.2*  ALBUMIN 2.0* 2.1* 2.0*   No results for input(s): LIPASE, AMYLASE in the last 168  hours. Recent Labs  Lab 03/01/21 1516  AMMONIA 13   Coagulation Profile: No results for input(s): INR, PROTIME in the last 168 hours. CBC: Recent Labs  Lab 02/26/21 0854 02/27/21 0336 02/28/21 0529 03/01/21 0504 03/02/21 0036 03/03/21 0315  WBC 14.2* 15.8* 17.1* 13.6* 14.0*  --   HGB 9.5* 9.5* 9.9* 8.8* 8.8* 8.6*  HCT 29.5* 29.6* 29.7* 27.1* 26.9* 26.7*  MCV 97.7 96.7 94.6 94.4 94.7  --   PLT 312 335 308 266 235  --    Cardiac Enzymes: No results for input(s): CKTOTAL, CKMB, CKMBINDEX, TROPONINI in the last 168 hours. BNP: Invalid input(s): POCBNP CBG: No results for input(s): GLUCAP in the last 168 hours. HbA1C: No results for input(s): HGBA1C in the last 72 hours. Urine analysis:  Component Value Date/Time   COLORURINE YELLOW 02/28/2021 1020   APPEARANCEUR CLEAR 02/28/2021 1020   APPEARANCEUR Clear 01/17/2021 1342   LABSPEC 1.011 02/28/2021 1020   PHURINE 5.0 02/28/2021 1020   GLUCOSEU NEGATIVE 02/28/2021 1020   HGBUR MODERATE (A) 02/28/2021 1020   BILIRUBINUR NEGATIVE 02/28/2021 1020   BILIRUBINUR Negative 01/17/2021 [place] 02/28/2021 1020   PROTEINUR 30 (A) 02/28/2021 1020   NITRITE NEGATIVE 02/28/2021 1020   LEUKOCYTESUR NEGATIVE 02/28/2021 1020   Sepsis Labs: _0 (procalcitonin:4,lacticidven:4) ) Recent Results (from the past 240 hour(s))  Surgical PCR screen     Status: None   Collection Time: 02/23/21  2:36 AM   Specimen: Nasal Mucosa; Nasal Swab  Result Value Ref Range Status   MRSA, PCR NEGATIVE NEGATIVE Final   Staphylococcus aureus NEGATIVE NEGATIVE Final    Comment: (NOTE) The Xpert SA Assay (FDA approved for NASAL specimens in patients 21 years of age and older), is one component of a comprehensive surveillance program. It is not intended to diagnose infection nor to guide or monitor treatment. Performed at [facility], [address], [place],  [address]      Scheduled Meds:  acetaminophen  650 mg  Oral Q6H   allopurinol  100 mg Oral Daily   apixaban  2.5 mg Oral BID   Chlorhexidine Gluconate Cloth  6 each Topical Daily   docusate sodium  100 mg Oral BID   finasteride  5 mg Oral Daily   metoprolol succinate  50 mg Oral BID   pantoprazole  40 mg Oral QPM   sodium chloride flush  10-40 mL Intracatheter Q12H   tamsulosin  0.4 mg Oral BID   Continuous Infusions:  0.9 % NaCl with KCl 20 mEq / L 75 mL/hr at 03/03/21 0313   ceFEPime (MAXIPIME) IV 2 g (03/03/21 0854)   magnesium sulfate bolus IVPB      Procedures/Studies: DG Pelvis 1-2 Views  Result Date: 02/19/2021 CLINICAL DATA:  Fall today EXAM: PELVIS - 1-2 VIEW COMPARISON:  None. FINDINGS: There is no evidence of pelvic fracture or diastasis. No pelvic bone lesions are seen. Atherosclerotic calcification. IMPRESSION: Negative. Electronically Signed   By: Franchot Gallo M.D.   On: 02/19/2021 15:50   DG Knee 1-2 Views Left  Result Date: 02/19/2021 CLINICAL DATA:  Left lower leg pain and swelling after fall EXAM: LEFT TIBIA AND FIBULA - 2 VIEW; LEFT KNEE - 1-2 VIEW COMPARISON:  None FINDINGS: Subtle cortical irregularity involving the peripheral aspect of the lateral tibial plateau articular surface suspicious for tibial plateau fracture. There is a large knee joint effusion. Severe tricompartmental osteoarthritis, most pronounced laterally. Ossification within the region of the distal patellar tendon. Remainder of the tibia and fibula are intact. Ankle mortise appears congruent. Advanced degenerative changes within the midfoot. Diffuse soft tissue edema, nonspecific. Extensive severe vascular calcifications. IMPRESSION: 1. Findings suspicious for a non-depressed lateral tibial plateau fracture. 2. Large knee joint effusion, likely hemarthrosis. 3. Severe tricompartmental osteoarthritis of the left knee. Electronically Signed   By: Davina Poke D.O.   On: 02/19/2021 16:43   DG Tibia/Fibula Left  Result Date: 02/19/2021 CLINICAL DATA:   Left lower leg pain and swelling after fall EXAM: LEFT TIBIA AND FIBULA - 2 VIEW; LEFT KNEE - 1-2 VIEW COMPARISON:  None FINDINGS: Subtle cortical irregularity involving the peripheral aspect of the lateral tibial plateau articular surface suspicious for tibial plateau fracture. There is a large knee joint effusion. Severe tricompartmental osteoarthritis, most pronounced laterally.  Ossification within the region of the distal patellar tendon. Remainder of the tibia and fibula are intact. Ankle mortise appears congruent. Advanced degenerative changes within the midfoot. Diffuse soft tissue edema, nonspecific. Extensive severe vascular calcifications. IMPRESSION: 1. Findings suspicious for a non-depressed lateral tibial plateau fracture. 2. Large knee joint effusion, likely hemarthrosis. 3. Severe tricompartmental osteoarthritis of the left knee. Electronically Signed   By: Davina Poke D.O.   On: 02/19/2021 16:43   CT HEAD WO CONTRAST  Result Date: 02/28/2021 CLINICAL DATA:  Mental status changes of unknown cause. Acute delirium. EXAM: CT HEAD WITHOUT CONTRAST TECHNIQUE: Contiguous axial images were obtained from the base of the skull through the vertex without intravenous contrast. COMPARISON:  02/19/2021 FINDINGS: Brain: Age related volume loss. Chronic small-vessel ischemic changes of the cerebral hemispheric white matter. No sign of acute infarction, mass lesion, hemorrhage, hydrocephalus or extra-axial collection. Vascular: There is atherosclerotic calcification of the major vessels at the base of the brain. Skull: Negative Sinuses/Orbits: Chronic right maxillary sinusitis.  Orbits negative. Other: None IMPRESSION: No acute intracranial finding. Atrophy and chronic small-vessel ischemic changes of the white matter Chronic right maxillary sinusitis. Electronically Signed   By: Nelson Chimes M.D.   On: 02/28/2021 19:44   CT Head Wo Contrast  Result Date: 02/19/2021 CLINICAL DATA:  Head trauma, loss of  consciousness.  Altered, falls. EXAM: CT HEAD WITHOUT CONTRAST TECHNIQUE: Contiguous axial images were obtained from the base of the skull through the vertex without intravenous contrast. COMPARISON:  Brain MRI 08/19/2020.  Head CT 08/19/2020. FINDINGS: Brain: Mild generalized cerebral atrophy. Moderate patchy and ill-defined hypoattenuation within the cerebral white matter, nonspecific but compatible with chronic small vessel ischemic disease. There is no acute intracranial hemorrhage. No demarcated cortical infarct. No extra-axial fluid collection. No evidence of intracranial mass. No midline shift. Vascular: No hyperdense vessel.  Atherosclerotic calcifications. Skull: Normal. Negative for fracture or focal lesion. Sinuses/Orbits: Visualized orbits show no acute finding. Fairly extensive partial opacification of the right maxillary sinus. Trace left maxillary sinus mucosal thickening. IMPRESSION: No evidence of acute intracranial abnormality. Moderate cerebral white matter chronic small vessel ischemic disease. Mild generalized parenchymal atrophy. Chronic right maxillary sinusitis. Electronically Signed   By: Kellie Simmering DO   On: 02/19/2021 14:12   MR BRAIN WO CONTRAST  Result Date: 03/02/2021 CLINICAL DATA:  Altered mental status EXAM: MRI HEAD WITHOUT CONTRAST TECHNIQUE: Multiplanar, multiecho pulse sequences of the brain and surrounding structures were obtained without intravenous contrast. COMPARISON:  December 2021 FINDINGS: Motion artifact is present. Brain: There is no acute infarction or intracranial hemorrhage. There is no intracranial mass, mass effect, or edema. There is no hydrocephalus or extra-axial fluid collection. Ventricles and sulci are stable in size and configuration. Patchy and confluent areas of T2 hyperintensity in the supratentorial white matter likely reflects stable chronic microvascular ischemic changes. Focus of susceptibility is again identified in the right cerebellum along  the fourth ventricle most compatible with chronic microhemorrhage or occult cavernous malformation. Vascular: Major vessel flow voids at the skull base are preserved. Skull and upper cervical spine: Normal marrow signal is preserved. Sinuses/Orbits: Chronic right maxillary sinusitis. Bilateral lens replacements. Other: Sella is unremarkable.  Mastoid air cells are clear. IMPRESSION: Motion degradation. No evidence of recent infarction, hemorrhage, or mass. Stable chronic findings detailed above. Electronically Signed   By: Macy Mis M.D.   On: 03/02/2021 09:24   MR KNEE LEFT WO CONTRAST  Result Date: 03/01/2021 CLINICAL DATA:  Knee replacement, known infection s/p knee  washout, increased knee pain, worsening confusion and white blood cell ct, EXAM: MRI OF THE LEFT KNEE WITHOUT CONTRAST TECHNIQUE: Multiplanar, multisequence MR imaging of the knee was performed. No intravenous contrast was administered. COMPARISON:  Left knee radiograph 02/19/2021 FINDINGS: Severe image degradation due to motion artifact. MENISCI Medial: Macerated appearance of the anterior horn and body of the medial meniscus with multidirectional tearing in the posterior horn. Lateral: Macerated anterior horn of the lateral meniscus with likely torn meniscal root and mild extrusion. LIGAMENTS Cruciates: The ACL is not well visualized and possibly chronically torn. Intact PCL. Collaterals: Medial collateral ligament is intact. Lateral collateral ligament complex is intact. CARTILAGE Patellofemoral:  Moderate-severe chondrosis. Medial:  Moderate-severe chondrosis Lateral: Severe chondrosis with full-thickness cartilage loss along the weight-bearing surfaces. JOINT: Moderate-sized exudative appearing joint effusion with synovitis. POPLITEAL FOSSA: Moderate size Baker cyst. EXTENSOR MECHANISM: Intact quadriceps tendon. Intact patellar tendon. Intact lateral patellar retinaculum. Intact medial patellar retinaculum. Intact MPFL. BONES:  Periarticular, subchondral bony edema favored to be related to severe degenerative arthritis. Other: Extensive soft tissue swelling along the knee including edema signal within the visualized musculature. IMPRESSION: Moderate-sized exudative appearing joint effusion with synovitis. Periarticular, subchondral bony edema, favored to be due to severe degenerative arthritis, though early changes of osteomyelitis is possible. Extensive soft tissue swelling along the knee including within the visualized musculature consistent with myositis. Severe tricompartment osteoarthritis, worst in the lateral compartment with essentially full-thickness cartilage loss along the weight-bearing surfaces. Macerated appearance of the anterior horn and body of the medial meniscus with multidirectional degenerative tearing in the posterior horn. Macerated anterior horn of the lateral meniscus with torn meniscal root and mild extrusion. Poorly visualized ACL, possibly chronically torn, correlate for instability. Moderate size Baker cyst. Electronically Signed   By: Maurine Simmering   On: 03/01/2021 19:54   DG CHEST PORT 1 VIEW  Result Date: 02/26/2021 CLINICAL DATA:  Evaluate PICC line placement EXAM: PORTABLE CHEST 1 VIEW COMPARISON:  February 19, 2021 FINDINGS: A new right PICC line terminates in the central SVC. No pneumothorax. Stable cardiomegaly. No other changes. IMPRESSION: The new right PICC line terminates in the central SVC. No pneumothorax. No other changes. Electronically Signed   By: Dorise Bullion III M.D   On: 02/26/2021 15:22   DG Chest Port 1 View  Result Date: 02/19/2021 CLINICAL DATA:  Question sepsis EXAM: PORTABLE CHEST 1 VIEW COMPARISON:  12/05/2009. FINDINGS: The heart size and mediastinal contours are within normal limits. Atherosclerotic calcification aortic arch. Both lungs are clear. Degenerative change in both shoulders. IMPRESSION: No active disease. Electronically Signed   By: Franchot Gallo M.D.   On:  02/19/2021 14:25   EEG adult  Result Date: 03/02/2021 Lora Havens, MD     03/02/2021  5:11 PM Patient Name: KYEN TAITE MRN: [id number] Epilepsy Attending: Lora Havens Referring Physician/Provider: Dr Phillips Odor Date: 03/02/2021 Duration: 22.50 mins Patient history: 87yo M with ams. EEG to evaluate for seizure Level of alertness: Awake AEDs during EEG study: None Technical aspects: This EEG study was done with scalp electrodes positioned according to the 10-20 International system of electrode placement. Electrical activity was acquired at a sampling rate of _0  and reviewed with a high frequency filter of _1  and a low frequency filter of _2 . EEG data were recorded continuously and digitally stored. Description: EEG showed continuous generalized 3 to 6 Hz theta-delta slowing. Intermittent generalized periodic discharges with triphasic morphology at  _3  were also noted. Hyperventilation and photic stimulation were  not performed.   ABNORMALITY - Periodic discharges with triphasic morphology, generalized ( GPDs) - Continuous slow, generalized IMPRESSION: This study is suggestive of moderate diffuse encephalopathy, nonspecific etiology but likely related to toxic-metabolic causes. No seizures or definite epileptiform discharges were seen throughout the recording. Priyanka O Yadav   [place] EKG SITE RITE  Result Date: 02/26/2021 If [facility] image not attached, placement could not be confirmed due to current cardiac rhythm.   Orson Eva, DO  [facility]  If 7PM-7AM, please contact night-coverage [email or web address] Password [email or web address] 03/03/2021, 2:07 PM   LOS: 12 days
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
## 2021-03-04 ENCOUNTER — Telehealth: Payer: Self-pay | Admitting: Orthopedic Surgery

## 2021-03-04 LAB — BASIC METABOLIC PANEL
Anion gap: 8 (ref 5–15)
BUN: 36 mg/dL — ABNORMAL HIGH (ref 8–23)
CO2: 18 mmol/L — ABNORMAL LOW (ref 22–32)
Calcium: 9 mg/dL (ref 8.9–10.3)
Chloride: 107 mmol/L (ref 98–111)
Creatinine, Ser: 2.07 mg/dL — ABNORMAL HIGH (ref 0.61–1.24)
GFR, Estimated: 31 mL/min — ABNORMAL LOW (ref >60)
Glucose, Bld: 98 mg/dL (ref 70–99)
Potassium: 3.8 mmol/L (ref 3.5–5.1)
Sodium: 133 mmol/L — ABNORMAL LOW (ref 135–145)

## 2021-03-04 LAB — CBC
HCT: 24.1 % — ABNORMAL LOW (ref 39.0–52.0)
Hemoglobin: 7.9 g/dL — ABNORMAL LOW (ref 13.0–17.0)
MCH: 31.5 pg (ref 26.0–34.0)
MCHC: 32.8 g/dL (ref 30.0–36.0)
MCV: 96 fL (ref 80.0–100.0)
Platelets: 201 10*3/uL (ref 150–400)
RBC: 2.51 MIL/uL — ABNORMAL LOW (ref 4.22–5.81)
RDW: 15.5 % (ref 11.5–15.5)
WBC: 10 10*3/uL (ref 4.0–10.5)
nRBC: 0 % (ref 0.0–0.2)

## 2021-03-04 MED ORDER — METOPROLOL SUCCINATE ER 25 MG PO TB24
25.0000 mg | ORAL_TABLET | Freq: Two times a day (BID) | ORAL | Status: DC
  Administered 2021-03-04 – 2021-03-07 (×7): 25 mg via ORAL
  Filled 2021-03-04 (×6): qty 1

## 2021-03-04 MED ORDER — SODIUM CHLORIDE 0.9 % IV SOLN
2.0000 g | INTRAVENOUS | Status: DC
  Administered 2021-03-05 – 2021-03-08 (×4): 2 g via INTRAVENOUS
  Filled 2021-03-04 (×4): qty 2

## 2021-03-04 MED ORDER — POTASSIUM CHLORIDE IN NACL 20-0.9 MEQ/L-% IV SOLN: INTRAVENOUS | Status: AC

## 2021-03-04 NOTE — Telephone Encounter (Signed)
Patient's daughter Sharyn Lull has a question regarding his knee brace  Would you please give her a call?  Thanks

## 2021-03-04 NOTE — Telephone Encounter (Signed)
Called about cryocuff told her okay to take it off but make sure he is still using it 5 or 6 times daily to help with swelling. Told her when he has it off encourage him to bend his knee, she said may be hard with it wrapped like it is, but hopefully he will be able to do this soon.

## 2021-03-04 NOTE — Progress Notes (Signed)
PHARMACY NOTE:  ANTIMICROBIAL RENAL DOSAGE ADJUSTMENT  Current antimicrobial regimen includes a mismatch between antimicrobial dosage and estimated renal function.  As per policy approved by the Pharmacy & Therapeutics and Medical Executive Committees, the antimicrobial dosage will be adjusted accordingly.  Current antimicrobial dosage:  Cefepime 2000 mg IV every 12 hours.  Indication: Pseudomonas   Renal Function:  Estimated Creatinine Clearance: 28.3 mL/min (A) (by C-G formula based on SCr of 2.07 mg/dL (H)). '[]'$      On intermittent HD, scheduled: '[]'$      On CRRT    Antimicrobial dosage has been changed to:  Cefepime 2000 mg IV every 24 hours.  Additional comments:   Thank you for allowing pharmacy to be a part of this patient's care.  Ramond Craver, [facility] 03/04/2021 10:52 AM

## 2021-03-04 NOTE — Progress Notes (Signed)
Physical Therapy Treatment Patient Details Name: Philip Mcclain MRN: [id number] DOB: 1934-02-12 Today's Date: 03/04/2021    History of Present Illness Pt is an 87 yo s/p left knee washout/lavage on 6/22. Pt brought into the emergency room with a swollen left knee and confusion on 6/18 and found to be in severe sepsis from infected left knee joint. Of note, pt seen by orthopedics in the outpatient setting and underwent left knee aspiration for steroid injection on June 14 with fluid findings consistent with gout, but also grew out Pseudomonas. Head CT 27: No acute intracranial finding. PMH: pulmonary hypertension, afib, HTN    PT Comments    Patient requires mod assist to to upright trunk and for LE movement to transition to seated EOB. Patient demonstrates poor sitting balance requiring UE support to maintain balance. Patient completes ankle exercises at bed with cueing. He is able to complete LAQ on RLE but no on LLE secondary to knee pain. Patient limited by fatigue today and is assisted back to supine. Patient will benefit from continued physical therapy in hospital and recommended venue below to increase strength, balance, endurance for safe ADLs and gait.    Follow Up Recommendations  SNF;Supervision for mobility/OOB;Supervision/Assistance - 24 hour     Equipment Recommendations  None recommended by PT    Recommendations for Other Services       Precautions / Restrictions Precautions Precautions: Fall Restrictions LLE Weight Bearing: Weight bearing as tolerated Other Position/Activity Restrictions: PROM/AROM Left knee    Mobility  Bed Mobility Overal bed mobility: Needs Assistance Bed Mobility: Supine to Sit;Sit to Supine;Rolling Rolling: Min assist   Supine to sit: Mod assist;HOB elevated Sit to supine: Mod assist   General bed mobility comments: requires mod assist to upright trunk and for LE movement to transition to seated EOB    Transfers                     Ambulation/Gait                 Stairs             Wheelchair Mobility    Modified Rankin (Stroke Patients Only)       Balance Overall balance assessment: Needs assistance Sitting-balance support: Feet supported;Bilateral upper extremity supported Sitting balance-Leahy Scale: Poor Sitting balance - Comments: reliant on UE support while seated EOB                                    Cognition Arousal/Alertness: Awake/alert Behavior During Therapy: WFL for tasks assessed/performed Overall Cognitive Status: No family/caregiver present to determine baseline cognitive functioning                                 General Comments: Pt very HOH, requires increased time with commands, multimodal cues for sequencing      Exercises General Exercises - Lower Extremity Ankle Circles/Pumps: AROM;Both;10 reps;Seated    General Comments        Pertinent Vitals/Pain Pain Assessment: Faces Faces Pain Scale: Hurts whole lot Pain Location: L knee Pain Descriptors / Indicators: Grimacing;Guarding;Moaning Pain Intervention(s): Limited activity within patient's tolerance;Repositioned;Monitored during session    Home Living                      Prior Function  PT Goals (current goals can now be found in the care plan section) Acute Rehab PT Goals Patient Stated Goal: Go home with help from family. PT Goal Formulation: With patient Time For Goal Achievement: 03/10/21 Potential to Achieve Goals: Fair Progress towards PT goals: Not progressing toward goals - comment (limited by pain)    Frequency    Min 3X/week      PT Plan Current plan remains appropriate    Co-evaluation              AM-PAC PT "6 Clicks" Mobility   Outcome Measure  Help needed turning from your back to your side while in a flat bed without using bedrails?: A Little Help needed moving from lying on your back to sitting on the side of a  flat bed without using bedrails?: A Lot Help needed moving to and from a bed to a chair (including a wheelchair)?: Total Help needed standing up from a chair using your arms (e.g., wheelchair or bedside chair)?: Total Help needed to walk in hospital room?: Total Help needed climbing 3-5 steps with a railing? : Total 6 Click Score: 9    End of Session Equipment Utilized During Treatment: Gait belt Activity Tolerance: Patient limited by pain Patient left: in bed;with call bell/phone within reach;with bed alarm set Nurse Communication: Mobility status PT Visit Diagnosis: Unsteadiness on feet (R26.81);Other abnormalities of gait and mobility (R26.89);Muscle weakness (generalized) (M62.81);Difficulty in walking, not elsewhere classified (R26.2);Pain Pain - Right/Left: Left Pain - part of body: Knee     Time: [id number] PT Time Calculation (min) (ACUTE ONLY): 12 min  Charges:  $Therapeutic Activity: 8-22 mins                    1:52 PM, 03/04/21 Mearl Latin PT, DPT Physical Therapist at [facility]

## 2021-03-04 NOTE — Care Management Important Message (Signed)
Important Message  Patient Details  Name: Philip Mcclain MRN: [id number] Date of Birth: May 17, 1934   Medicare Important Message Given:  Yes     Tommy Medal 03/04/2021, 4:05 PM

## 2021-03-04 NOTE — TOC Progression Note (Signed)
Transition of Care [facility]) - Progression Note    Patient Details  Name: Philip Mcclain MRN: [id number] Date of Birth: 09-14-1933  Transition of Care [facility]) CM/SW Contact  Boneta Lucks, RN Phone Number: 03/04/2021, 11:03 AM  Clinical Narrative:   [facility] is offering a bed. Anderson [facility] called [facility], family is agreeing.  Patient will transfer today or tomorrow, when bed available and pending GI consult. Ebony Hail at [facility] updated.  Expected Discharge Plan: Long Term Acute Care (LTAC) Barriers to Discharge: Continued Medical Work up  Expected Discharge Plan and Services Expected Discharge Plan: Long Term Acute Care (LTAC)     Post Acute Care Choice: [place] arrangements for the past 2 months: Single Family Home                                       Social Determinants of Health (SDOH) Interventions    Readmission Risk Interventions Readmission Risk Prevention Plan 03/04/2021  Transportation Screening Complete  PCP or Specialist Appt within 5-7 Days Complete  Home Care Screening Complete  Medication Review (RN CM) Complete  Some recent data might be hidden

## 2021-03-04 NOTE — Progress Notes (Signed)
PROGRESS NOTE  Philip Mcclain:[id number] DOB: [birth year] DOA: 02/19/2021 PCP: Glenda Chroman, MD Brief History:  87 year old with past medical history of pulmonary hypertension, persistent atrial fibrillation on Eliquis [place] and hypertension seen by orthopedics in the outpatient setting and underwent left knee aspiration for steroid injection on June 14 with fluid findings consistent with gout, but also grew out Pseudomonas.  Brought into the emergency room with a swollen left knee and confusion on 6/18 and found to be in severe sepsis from infected left knee joint.  Patient started on IV fluids and antibiotics and admitted to the hospitalist service.  Orthopedics consulted and patient underwent left knee washout on 6/22.  Since then was having some severe knee pain over the next 24 hours and on night of 6/23, developed upper extremity tremors   Initially, knee pain and tremors have both improved, especially with discontinuation of morphine and Oxy IR.  In the last 2 days, patient a bit more confused.  Seems a little more lucid today although having significant knee pain.  Blood pressures have also trended up in the last few days with systolic in the 397Q to 734L.  Head CT negative.  This morning, patient a bit more appropriate, but as day progressed, became somnolent and then confused this afternoon.   Assessment/Plan: Severe pseudomonal sepsis from septic joint of left knee joint [facility]):  -Patient met criteria for severe sepsis given tachycardia, tachypnea, leukocytosis with acute kidney injury, lactic acidosis, present on admission.   -Sepsis stabilized.  Lactic acid now normalized.  -knee washout 6/22.  Continue cefepime  Previous cultures grew out Pseudomonas and cultures from washout grew out sensitive Pseudomonas added Toradol x1 for knee pain in addition to his other pain medications.  Discussed with infectious disease who recommend 4 weeks of IV antibiotics.  PICC line placed 6/25.   Infectious disease following.  Patient to be on cefepime 2 g twice a day for the next 3 weeks for total of 4 weeks.   -6/28 MR knee--mod size exudative effusion with synovitis.  Periarticular and subchondral bony edema;  extensive soft tissue edema -CRP 19.4>>5.2 -plan cefepime with last day on 03/21/21   Pulmonary HTN ([place]):  -Clinically compensated.   Essential hypertension:  -Blood pressures have been trending upward.  Have restarted patient's Lasix.  Has responded with significant diuresis -hold additional lasix with uptrending creatinine and does not appear volume overloaded -Adding as needed hydralazine.     Persistent atrial fibrillation ([place]):  -Rate controlled.  -held apixaban temporarily -no further hematochezia==>restart apixaban and monitor   Acute toxic/metabolic Encephalopathy -multifactorial including infectious process, hyponatremia, increasing serum creatinine, and anticholinergic side effects -MRI brain neg for acute findings -B12--1453 -folate 14.3 -ammonia 13 -ABG--no hypoxia nor hypercarbia -UA--neg for pyuria -appreciate neurology input -restart IVF   Heme Positive stool -unclear if RN actually saw any hematochezia or melena -GI consult -held apixaban temporarily   AKI on chronic stage IIIb chronic kidney disease:  -Patient on admission with creatinine of 2.16, due to sepsis.   -baseline creatinine 1.4-1.5 -discontinue lasix IV -restart gentle IVF   Tremors -Possibly from morphine so this was discontinued.  Resolved   BPH -continue flomax -urinating fine   Acute hyponatremia:  -Secondary to hypovolemia and poor solute inake.      Status is: Inpatient   Remains inpatient appropriate because:Altered mental status   Dispo: The patient is from: Home  Anticipated d/c is to: Home              Patient currently is not medically stable to d/c.              Difficult to place patient No               Family Communication:    daughter updated 7/1   Consultants:  neurology, ortho   Code Status: DNR   DVT Prophylaxis:  apixaban     Procedures: As Listed in Progress Note Above   Antibiotics: Cefepime 6/19>>>      Subjective: Patient is pleasantly confused, but more communicative today and more alert.  Denies cp, sob, n/v/d, abd pain.  Complains of left leg pain.  Objective: Vitals:   03/03/21 1429 03/03/21 2020 03/04/21 0518 03/04/21 0930  BP: 126/68 (!) 149/69 135/65 (!) 117/58  Pulse: 61 64 70 (!) 55  Resp: 18 18    Temp: 98.4 F (36.9 C) 98.4 F (36.9 C)    TempSrc: Oral Oral    SpO2: 94% 100% 100%   Weight:      Height:        Intake/Output Summary (Last 24 hours) at 03/04/2021 1256 Last data filed at 03/04/2021 0900 Gross per 24 hour  Intake 2749.59 ml  Output 550 ml  Net 2199.59 ml   Weight change:  Exam:  General:  Pt is alert, follows commands appropriately, not in acute distress HEENT: No icterus, No thrush, No neck mass, [place]/AT Cardiovascular: IRRR, S1/S2, no rubs, no gallops Respiratory: bibasilar crackles. No wheeze Abdomen: Soft/+BS, non tender, non distended, no guarding Extremities: trace LE edema, No lymphangitis, No petechiae, No rashes, no synovitis   Data Reviewed: I have personally reviewed following labs and imaging studies Basic Metabolic Panel: Recent Labs  Lab 02/28/21 0529 03/01/21 0504 03/02/21 0036 03/03/21 0421 03/04/21 0602  NA 132* 131* 131* 131* 133*  K 3.8 3.4* 3.5 3.6 3.8  CL 108 106 105 105 107  CO2 19* 21* 20* 21* 18*  GLUCOSE 107* 97 96 92 98  BUN 34* 33* 34* 34* 36*  CREATININE 1.67* 1.64* 1.84* 2.01* 2.07*  CALCIUM 9.2 9.1 8.8* 8.9 9.0  MG  --   --   --  1.5*  --    Liver Function Tests: Recent Labs  Lab 03/01/21 0504 03/02/21 0036 03/03/21 0421  AST 14* 16 15  ALT _0 ALKPHOS 93 98 94  BILITOT 0.4 0.5 0.5  PROT 5.0* 5.3* 5.2*  ALBUMIN 2.0* 2.1* 2.0*   No results for input(s): LIPASE, AMYLASE in the last 168  hours. Recent Labs  Lab 03/01/21 1516  AMMONIA 13   Coagulation Profile: No results for input(s): INR, PROTIME in the last 168 hours. CBC: Recent Labs  Lab 02/27/21 0336 02/28/21 0529 03/01/21 0504 03/02/21 0036 03/03/21 0315 03/04/21 0602  WBC 15.8* 17.1* 13.6* 14.0*  --  10.0  HGB 9.5* 9.9* 8.8* 8.8* 8.6* 7.9*  HCT 29.6* 29.7* 27.1* 26.9* 26.7* 24.1*  MCV 96.7 94.6 94.4 94.7  --  96.0  PLT 335 308 266 235  --  201   Cardiac Enzymes: No results for input(s): CKTOTAL, CKMB, CKMBINDEX, TROPONINI in the last 168 hours. BNP: Invalid input(s): POCBNP CBG: No results for input(s): GLUCAP in the last 168 hours. HbA1C: No results for input(s): HGBA1C in the last 72 hours. Urine analysis:    Component Value Date/Time   COLORURINE YELLOW 02/28/2021 [place]  02/28/2021 1020   APPEARANCEUR Clear 01/17/2021 1342   LABSPEC 1.011 02/28/2021 1020   PHURINE 5.0 02/28/2021 1020   GLUCOSEU NEGATIVE 02/28/2021 1020   HGBUR MODERATE (A) 02/28/2021 1020   BILIRUBINUR NEGATIVE 02/28/2021 1020   BILIRUBINUR Negative 01/17/2021 [place] 02/28/2021 1020   PROTEINUR 30 (A) 02/28/2021 1020   NITRITE NEGATIVE 02/28/2021 1020   LEUKOCYTESUR NEGATIVE 02/28/2021 1020   Sepsis Labs: _0 (procalcitonin:4,lacticidven:4) ) Recent Results (from the past 240 hour(s))  Surgical PCR screen     Status: None   Collection Time: 02/23/21  2:36 AM   Specimen: Nasal Mucosa; Nasal Swab  Result Value Ref Range Status   MRSA, PCR NEGATIVE NEGATIVE Final   Staphylococcus aureus NEGATIVE NEGATIVE Final    Comment: (NOTE) The Xpert SA Assay (FDA approved for NASAL specimens in patients 80 years of age and older), is one component of a comprehensive surveillance program. It is not intended to diagnose infection nor to guide or monitor treatment. Performed at [facility], [address] [place], [place] [address]      Scheduled Meds:  acetaminophen  650 mg Oral  Q6H   allopurinol  100 mg Oral Daily   apixaban  2.5 mg Oral BID   Chlorhexidine Gluconate Cloth  6 each Topical Daily   docusate sodium  100 mg Oral BID   finasteride  5 mg Oral Daily   metoprolol succinate  25 mg Oral BID   pantoprazole  40 mg Oral QPM   sodium chloride flush  10-40 mL Intracatheter Q12H   tamsulosin  0.4 mg Oral BID   Continuous Infusions:  [START ON 03/05/2021] ceFEPime (MAXIPIME) IV      Procedures/Studies: DG Pelvis 1-2 Views  Result Date: 02/19/2021 CLINICAL DATA:  Fall today EXAM: PELVIS - 1-2 VIEW COMPARISON:  None. FINDINGS: There is no evidence of pelvic fracture or diastasis. No pelvic bone lesions are seen. Atherosclerotic calcification. IMPRESSION: Negative. Electronically Signed   By: Franchot Gallo M.D.   On: 02/19/2021 15:50   DG Knee 1-2 Views Left  Result Date: 02/19/2021 CLINICAL DATA:  Left lower leg pain and swelling after fall EXAM: LEFT TIBIA AND FIBULA - 2 VIEW; LEFT KNEE - 1-2 VIEW COMPARISON:  None FINDINGS: Subtle cortical irregularity involving the peripheral aspect of the lateral tibial plateau articular surface suspicious for tibial plateau fracture. There is a large knee joint effusion. Severe tricompartmental osteoarthritis, most pronounced laterally. Ossification within the region of the distal patellar tendon. Remainder of the tibia and fibula are intact. Ankle mortise appears congruent. Advanced degenerative changes within the midfoot. Diffuse soft tissue edema, nonspecific. Extensive severe vascular calcifications. IMPRESSION: 1. Findings suspicious for a non-depressed lateral tibial plateau fracture. 2. Large knee joint effusion, likely hemarthrosis. 3. Severe tricompartmental osteoarthritis of the left knee. Electronically Signed   By: Davina Poke D.O.   On: 02/19/2021 16:43   DG Tibia/Fibula Left  Result Date: 02/19/2021 CLINICAL DATA:  Left lower leg pain and swelling after fall EXAM: LEFT TIBIA AND FIBULA - 2 VIEW; LEFT KNEE -  1-2 VIEW COMPARISON:  None FINDINGS: Subtle cortical irregularity involving the peripheral aspect of the lateral tibial plateau articular surface suspicious for tibial plateau fracture. There is a large knee joint effusion. Severe tricompartmental osteoarthritis, most pronounced laterally. Ossification within the region of the distal patellar tendon. Remainder of the tibia and fibula are intact. Ankle mortise appears congruent. Advanced degenerative changes within the midfoot. Diffuse soft tissue edema, nonspecific. Extensive severe vascular calcifications.  IMPRESSION: 1. Findings suspicious for a non-depressed lateral tibial plateau fracture. 2. Large knee joint effusion, likely hemarthrosis. 3. Severe tricompartmental osteoarthritis of the left knee. Electronically Signed   By: Davina Poke D.O.   On: 02/19/2021 16:43   CT HEAD WO CONTRAST  Result Date: 02/28/2021 CLINICAL DATA:  Mental status changes of unknown cause. Acute delirium. EXAM: CT HEAD WITHOUT CONTRAST TECHNIQUE: Contiguous axial images were obtained from the base of the skull through the vertex without intravenous contrast. COMPARISON:  02/19/2021 FINDINGS: Brain: Age related volume loss. Chronic small-vessel ischemic changes of the cerebral hemispheric white matter. No sign of acute infarction, mass lesion, hemorrhage, hydrocephalus or extra-axial collection. Vascular: There is atherosclerotic calcification of the major vessels at the base of the brain. Skull: Negative Sinuses/Orbits: Chronic right maxillary sinusitis.  Orbits negative. Other: None IMPRESSION: No acute intracranial finding. Atrophy and chronic small-vessel ischemic changes of the white matter Chronic right maxillary sinusitis. Electronically Signed   By: Nelson Chimes M.D.   On: 02/28/2021 19:44   CT Head Wo Contrast  Result Date: 02/19/2021 CLINICAL DATA:  Head trauma, loss of consciousness.  Altered, falls. EXAM: CT HEAD WITHOUT CONTRAST TECHNIQUE: Contiguous axial  images were obtained from the base of the skull through the vertex without intravenous contrast. COMPARISON:  Brain MRI 08/19/2020.  Head CT 08/19/2020. FINDINGS: Brain: Mild generalized cerebral atrophy. Moderate patchy and ill-defined hypoattenuation within the cerebral white matter, nonspecific but compatible with chronic small vessel ischemic disease. There is no acute intracranial hemorrhage. No demarcated cortical infarct. No extra-axial fluid collection. No evidence of intracranial mass. No midline shift. Vascular: No hyperdense vessel.  Atherosclerotic calcifications. Skull: Normal. Negative for fracture or focal lesion. Sinuses/Orbits: Visualized orbits show no acute finding. Fairly extensive partial opacification of the right maxillary sinus. Trace left maxillary sinus mucosal thickening. IMPRESSION: No evidence of acute intracranial abnormality. Moderate cerebral white matter chronic small vessel ischemic disease. Mild generalized parenchymal atrophy. Chronic right maxillary sinusitis. Electronically Signed   By: Kellie Simmering DO   On: 02/19/2021 14:12   MR BRAIN WO CONTRAST  Result Date: 03/02/2021 CLINICAL DATA:  Altered mental status EXAM: MRI HEAD WITHOUT CONTRAST TECHNIQUE: Multiplanar, multiecho pulse sequences of the brain and surrounding structures were obtained without intravenous contrast. COMPARISON:  December 2021 FINDINGS: Motion artifact is present. Brain: There is no acute infarction or intracranial hemorrhage. There is no intracranial mass, mass effect, or edema. There is no hydrocephalus or extra-axial fluid collection. Ventricles and sulci are stable in size and configuration. Patchy and confluent areas of T2 hyperintensity in the supratentorial white matter likely reflects stable chronic microvascular ischemic changes. Focus of susceptibility is again identified in the right cerebellum along the fourth ventricle most compatible with chronic microhemorrhage or occult cavernous  malformation. Vascular: Major vessel flow voids at the skull base are preserved. Skull and upper cervical spine: Normal marrow signal is preserved. Sinuses/Orbits: Chronic right maxillary sinusitis. Bilateral lens replacements. Other: Sella is unremarkable.  Mastoid air cells are clear. IMPRESSION: Motion degradation. No evidence of recent infarction, hemorrhage, or mass. Stable chronic findings detailed above. Electronically Signed   By: Macy Mis M.D.   On: 03/02/2021 09:24   MR KNEE LEFT WO CONTRAST  Result Date: 03/01/2021 CLINICAL DATA:  Knee replacement, known infection s/p knee washout, increased knee pain, worsening confusion and white blood cell ct, EXAM: MRI OF THE LEFT KNEE WITHOUT CONTRAST TECHNIQUE: Multiplanar, multisequence MR imaging of the knee was performed. No intravenous contrast was administered. COMPARISON:  Left knee radiograph 02/19/2021 FINDINGS: Severe image degradation due to motion artifact. MENISCI Medial: Macerated appearance of the anterior horn and body of the medial meniscus with multidirectional tearing in the posterior horn. Lateral: Macerated anterior horn of the lateral meniscus with likely torn meniscal root and mild extrusion. LIGAMENTS Cruciates: The ACL is not well visualized and possibly chronically torn. Intact PCL. Collaterals: Medial collateral ligament is intact. Lateral collateral ligament complex is intact. CARTILAGE Patellofemoral:  Moderate-severe chondrosis. Medial:  Moderate-severe chondrosis Lateral: Severe chondrosis with full-thickness cartilage loss along the weight-bearing surfaces. JOINT: Moderate-sized exudative appearing joint effusion with synovitis. POPLITEAL FOSSA: Moderate size Baker cyst. EXTENSOR MECHANISM: Intact quadriceps tendon. Intact patellar tendon. Intact lateral patellar retinaculum. Intact medial patellar retinaculum. Intact MPFL. BONES: Periarticular, subchondral bony edema favored to be related to severe degenerative arthritis.  Other: Extensive soft tissue swelling along the knee including edema signal within the visualized musculature. IMPRESSION: Moderate-sized exudative appearing joint effusion with synovitis. Periarticular, subchondral bony edema, favored to be due to severe degenerative arthritis, though early changes of osteomyelitis is possible. Extensive soft tissue swelling along the knee including within the visualized musculature consistent with myositis. Severe tricompartment osteoarthritis, worst in the lateral compartment with essentially full-thickness cartilage loss along the weight-bearing surfaces. Macerated appearance of the anterior horn and body of the medial meniscus with multidirectional degenerative tearing in the posterior horn. Macerated anterior horn of the lateral meniscus with torn meniscal root and mild extrusion. Poorly visualized ACL, possibly chronically torn, correlate for instability. Moderate size Baker cyst. Electronically Signed   By: Maurine Simmering   On: 03/01/2021 19:54   DG CHEST PORT 1 VIEW  Result Date: 02/26/2021 CLINICAL DATA:  Evaluate PICC line placement EXAM: PORTABLE CHEST 1 VIEW COMPARISON:  February 19, 2021 FINDINGS: A new right PICC line terminates in the central SVC. No pneumothorax. Stable cardiomegaly. No other changes. IMPRESSION: The new right PICC line terminates in the central SVC. No pneumothorax. No other changes. Electronically Signed   By: Dorise Bullion III M.D   On: 02/26/2021 15:22   DG Chest Port 1 View  Result Date: 02/19/2021 CLINICAL DATA:  Question sepsis EXAM: PORTABLE CHEST 1 VIEW COMPARISON:  12/05/2009. FINDINGS: The heart size and mediastinal contours are within normal limits. Atherosclerotic calcification aortic arch. Both lungs are clear. Degenerative change in both shoulders. IMPRESSION: No active disease. Electronically Signed   By: Franchot Gallo M.D.   On: 02/19/2021 14:25   EEG adult  Result Date: 03/02/2021 Lora Havens, MD     03/02/2021  5:11  PM Patient Name: Philip Mcclain MRN: [id number] Epilepsy Attending: Lora Havens Referring Physician/Provider: Dr Phillips Odor Date: 03/02/2021 Duration: 22.50 mins Patient history: 87yo M with ams. EEG to evaluate for seizure Level of alertness: Awake AEDs during EEG study: None Technical aspects: This EEG study was done with scalp electrodes positioned according to the 10-20 International system of electrode placement. Electrical activity was acquired at a sampling rate of _0  and reviewed with a high frequency filter of _1  and a low frequency filter of _2 . EEG data were recorded continuously and digitally stored. Description: EEG showed continuous generalized 3 to 6 Hz theta-delta slowing. Intermittent generalized periodic discharges with triphasic morphology at  _3  were also noted. Hyperventilation and photic stimulation were not performed.   ABNORMALITY - Periodic discharges with triphasic morphology, generalized ( GPDs) - Continuous slow, generalized IMPRESSION: This study is suggestive of moderate diffuse encephalopathy, nonspecific etiology but likely related to toxic-metabolic causes. No  seizures or definite epileptiform discharges were seen throughout the recording. Priyanka O Yadav   [place] EKG SITE RITE  Result Date: 02/26/2021 If [facility] image not attached, placement could not be confirmed due to current cardiac rhythm.   Orson Eva, DO  [facility]  If 7PM-7AM, please contact night-coverage [email or web address] Password [email or web address] 03/04/2021, 12:56 PM   LOS: 13 days

## 2021-03-05 LAB — BASIC METABOLIC PANEL
Anion gap: 9 (ref 5–15)
BUN: 35 mg/dL — ABNORMAL HIGH (ref 8–23)
CO2: 17 mmol/L — ABNORMAL LOW (ref 22–32)
Calcium: 9.3 mg/dL (ref 8.9–10.3)
Chloride: 107 mmol/L (ref 98–111)
Creatinine, Ser: 2.02 mg/dL — ABNORMAL HIGH (ref 0.61–1.24)
GFR, Estimated: 32 mL/min — ABNORMAL LOW (ref >60)
Glucose, Bld: 94 mg/dL (ref 70–99)
Potassium: 4.2 mmol/L (ref 3.5–5.1)
Sodium: 133 mmol/L — ABNORMAL LOW (ref 135–145)

## 2021-03-05 LAB — CBC
HCT: 25.6 % — ABNORMAL LOW (ref 39.0–52.0)
Hemoglobin: 8.3 g/dL — ABNORMAL LOW (ref 13.0–17.0)
MCH: 31.3 pg (ref 26.0–34.0)
MCHC: 32.4 g/dL (ref 30.0–36.0)
MCV: 96.6 fL (ref 80.0–100.0)
Platelets: 214 10*3/uL (ref 150–400)
RBC: 2.65 MIL/uL — ABNORMAL LOW (ref 4.22–5.81)
RDW: 15.8 % — ABNORMAL HIGH (ref 11.5–15.5)
WBC: 11.6 10*3/uL — ABNORMAL HIGH (ref 4.0–10.5)
nRBC: 0 % (ref 0.0–0.2)

## 2021-03-05 LAB — MAGNESIUM: Magnesium: 1.8 mg/dL (ref 1.7–2.4)

## 2021-03-05 NOTE — Progress Notes (Addendum)
PROGRESS NOTE  JASAI SORG ATF:[id number] DOB: Jan 09, 1934 DOA: 02/19/2021 PCP: Glenda Chroman, MD  Brief History:  87 year old with past medical history of pulmonary hypertension, persistent atrial fibrillation on Eliquis [place] and hypertension seen by orthopedics in the outpatient setting and underwent left knee aspiration for steroid injection on June 14 with fluid findings consistent with gout, but also grew out Pseudomonas.  Brought into the emergency room with a swollen left knee and confusion on 6/18 and found to be in severe sepsis from infected left knee joint.  Patient started on IV fluids and antibiotics and admitted to the hospitalist service.  Orthopedics consulted and patient underwent left knee washout on 6/22.  Since then was having some severe knee pain over the next 24 hours and on night of 6/23, developed upper extremity tremors   Initially, knee pain and tremors have both improved, especially with discontinuation of morphine and Oxy IR.  In the last 2 days, patient a bit more confused.  Seems a little more lucid today although having significant knee pain.  Blood pressures have also trended up in the last few days with systolic in the 270W to 237S.  Head CT negative. Overall, the patient's mental status gradually improved despite being pleasantly confused.  Inflammatory markers suggested improving infectious process.   Assessment/Plan: Severe pseudomonal sepsis from septic joint of left knee joint [facility]):  -Patient met criteria for severe sepsis given tachycardia, tachypnea, leukocytosis with acute kidney injury, lactic acidosis, present on admission.   -Sepsis stabilized.  Lactic acid now normalized.  -knee washout 6/22.  Continue cefepime  Previous cultures grew out Pseudomonas and cultures from washout grew out sensitive Pseudomonas added Toradol x1 for knee pain in addition to his other pain medications.  Discussed with infectious disease who recommend 4 weeks of IV  antibiotics.  PICC line placed 6/25.  Infectious disease following.  Patient to be on cefepime 2 g twice a day for the next 3 weeks for total of 4 weeks.   -6/28 MR knee--mod size exudative effusion with synovitis.  Periarticular and subchondral bony edema;  extensive soft tissue edema -CRP 19.4>>5.2 -plan cefepime with last day on 03/21/21   Pulmonary HTN ([place]):  -Clinically compensated.   Essential hypertension:  -Blood pressures have been trending upward.  Have restarted patient's Lasix.  Has responded with significant diuresis -hold additional lasix with uptrending creatinine and does not appear volume overloaded -Adding as needed hydralazine.     Persistent atrial fibrillation ([place]):  -Rate controlled.  -held apixaban temporarily -no further hematochezia==>restart apixaban and monitor   Acute toxic/metabolic Encephalopathy -multifactorial including infectious process, hyponatremia, increasing serum creatinine, and anticholinergic side effects -MRI brain neg for acute findings -B12--1453 -folate 14.3 -ammonia 13 -ABG--no hypoxia nor hypercarbia -UA--neg for pyuria -appreciate neurology input -restart IVF -overall slowly improving although remains pleasantly confused   Heme Positive stool -unclear if RN actually saw any hematochezia or melena -GI consult -held apixaban temporarily>>restarted and Hgb remained stable   AKI on chronic stage IIIb chronic kidney disease:  -Patient on admission with creatinine of 2.16, due to sepsis.   -baseline creatinine 1.4-1.5 -discontinue lasix IV -restarted gentle IVF   Tremors -Possibly from morphine so this was discontinued.  Resolved   BPH -continue flomax -urinating fine   Acute hyponatremia:  -Secondary to hypovolemia and poor solute inake.      Status is: Inpatient   Remains inpatient appropriate because:Altered mental status   Dispo:  The patient is from: Home              Anticipated d/c is to: Home               Patient currently is not medically stable to d/c.              Difficult to place patient No               Family Communication:   daughter updated 7/1   Consultants:  neurology, ortho   Code Status: DNR   DVT Prophylaxis:  apixaban     Procedures: As Listed in Progress Note Above   Antibiotics: Cefepime 6/19>>>       Subjective:   Objective: Vitals:   03/04/21 1332 03/04/21 2057 03/05/21 0422 03/05/21 1300  BP: 124/72 (!) 141/61 (!) 144/93 (!) 142/88  Pulse: 66 (!) 53 67 66  Resp: $Remo'18 18 18 18  'MgKKs$ Temp: 98.3 F (36.8 C) 97.6 F (36.4 C) 98.4 F (36.9 C) 98.5 F (36.9 C)  TempSrc: Oral   Oral  SpO2: 96% 99% 97% 97%  Weight:      Height:        Intake/Output Summary (Last 24 hours) at 03/05/2021 1748 Last data filed at 03/05/2021 1518 Gross per 24 hour  Intake 1905.32 ml  Output 500 ml  Net 1405.32 ml   Weight change:  Exam:  General:  Pt is alert, follows commands appropriately, not in acute distress HEENT: No icterus, No thrush, No neck mass, [place]/AT Cardiovascular: RRR, S1/S2, no rubs, no gallops Respiratory: CTA bilaterally, no wheezing, no crackles, no rhonchi Abdomen: Soft/+BS, non tender, non distended, no guarding Extremities: No edema, No lymphangitis, No petechiae, No rashes, no synovitis   Data Reviewed: I have personally reviewed following labs and imaging studies Basic Metabolic Panel: Recent Labs  Lab 03/01/21 0504 03/02/21 0036 03/03/21 0421 03/04/21 0602 03/05/21 0659  NA 131* 131* 131* 133* 133*  K 3.4* 3.5 3.6 3.8 4.2  CL 106 105 105 107 107  CO2 21* 20* 21* 18* 17*  GLUCOSE 97 96 92 98 94  BUN 33* 34* 34* 36* 35*  CREATININE 1.64* 1.84* 2.01* 2.07* 2.02*  CALCIUM 9.1 8.8* 8.9 9.0 9.3  MG  --   --  1.5*  --  1.8   Liver Function Tests: Recent Labs  Lab 03/01/21 0504 03/02/21 0036 03/03/21 0421  AST 14* 16 15  ALT $Re'13 14 12  'reT$ ALKPHOS 93 98 94  BILITOT 0.4 0.5 0.5  PROT 5.0* 5.3* 5.2*  ALBUMIN 2.0* 2.1* 2.0*   No  results for input(s): LIPASE, AMYLASE in the last 168 hours. Recent Labs  Lab 03/01/21 1516  AMMONIA 13   Coagulation Profile: No results for input(s): INR, PROTIME in the last 168 hours. CBC: Recent Labs  Lab 02/28/21 0529 03/01/21 0504 03/02/21 0036 03/03/21 0315 03/04/21 0602 03/05/21 0659  WBC 17.1* 13.6* 14.0*  --  10.0 11.6*  HGB 9.9* 8.8* 8.8* 8.6* 7.9* 8.3*  HCT 29.7* 27.1* 26.9* 26.7* 24.1* 25.6*  MCV 94.6 94.4 94.7  --  96.0 96.6  PLT 308 266 235  --  201 214   Cardiac Enzymes: No results for input(s): CKTOTAL, CKMB, CKMBINDEX, TROPONINI in the last 168 hours. BNP: Invalid input(s): POCBNP CBG: No results for input(s): GLUCAP in the last 168 hours. HbA1C: No results for input(s): HGBA1C in the last 72 hours. Urine analysis:    Component Value Date/Time   COLORURINE YELLOW 02/28/2021 1020  APPEARANCEUR CLEAR 02/28/2021 1020   APPEARANCEUR Clear 01/17/2021 1342   LABSPEC 1.011 02/28/2021 1020   PHURINE 5.0 02/28/2021 1020   GLUCOSEU NEGATIVE 02/28/2021 1020   HGBUR MODERATE (A) 02/28/2021 1020   BILIRUBINUR NEGATIVE 02/28/2021 1020   BILIRUBINUR Negative 01/17/2021 [place] 02/28/2021 1020   PROTEINUR 30 (A) 02/28/2021 1020   NITRITE NEGATIVE 02/28/2021 1020   LEUKOCYTESUR NEGATIVE 02/28/2021 1020   Sepsis Labs: $RemoveBefo'@LABRCNTIP'MDGkqZPxfPL$ (procalcitonin:4,lacticidven:4) )No results found for this or any previous visit (from the past 240 hour(s)).   Scheduled Meds:  acetaminophen  650 mg Oral Q6H   allopurinol  100 mg Oral Daily   apixaban  2.5 mg Oral BID   Chlorhexidine Gluconate Cloth  6 each Topical Daily   docusate sodium  100 mg Oral BID   finasteride  5 mg Oral Daily   metoprolol succinate  25 mg Oral BID   pantoprazole  40 mg Oral QPM   sodium chloride flush  10-40 mL Intracatheter Q12H   tamsulosin  0.4 mg Oral BID   Continuous Infusions:  ceFEPime (MAXIPIME) IV Stopped (03/05/21 1002)    Procedures/Studies: DG Pelvis 1-2  Views  Result Date: 02/19/2021 CLINICAL DATA:  Fall today EXAM: PELVIS - 1-2 VIEW COMPARISON:  None. FINDINGS: There is no evidence of pelvic fracture or diastasis. No pelvic bone lesions are seen. Atherosclerotic calcification. IMPRESSION: Negative. Electronically Signed   By: Franchot Gallo M.D.   On: 02/19/2021 15:50   DG Knee 1-2 Views Left  Result Date: 02/19/2021 CLINICAL DATA:  Left lower leg pain and swelling after fall EXAM: LEFT TIBIA AND FIBULA - 2 VIEW; LEFT KNEE - 1-2 VIEW COMPARISON:  None FINDINGS: Subtle cortical irregularity involving the peripheral aspect of the lateral tibial plateau articular surface suspicious for tibial plateau fracture. There is a large knee joint effusion. Severe tricompartmental osteoarthritis, most pronounced laterally. Ossification within the region of the distal patellar tendon. Remainder of the tibia and fibula are intact. Ankle mortise appears congruent. Advanced degenerative changes within the midfoot. Diffuse soft tissue edema, nonspecific. Extensive severe vascular calcifications. IMPRESSION: 1. Findings suspicious for a non-depressed lateral tibial plateau fracture. 2. Large knee joint effusion, likely hemarthrosis. 3. Severe tricompartmental osteoarthritis of the left knee. Electronically Signed   By: Davina Poke D.O.   On: 02/19/2021 16:43   DG Tibia/Fibula Left  Result Date: 02/19/2021 CLINICAL DATA:  Left lower leg pain and swelling after fall EXAM: LEFT TIBIA AND FIBULA - 2 VIEW; LEFT KNEE - 1-2 VIEW COMPARISON:  None FINDINGS: Subtle cortical irregularity involving the peripheral aspect of the lateral tibial plateau articular surface suspicious for tibial plateau fracture. There is a large knee joint effusion. Severe tricompartmental osteoarthritis, most pronounced laterally. Ossification within the region of the distal patellar tendon. Remainder of the tibia and fibula are intact. Ankle mortise appears congruent. Advanced degenerative changes  within the midfoot. Diffuse soft tissue edema, nonspecific. Extensive severe vascular calcifications. IMPRESSION: 1. Findings suspicious for a non-depressed lateral tibial plateau fracture. 2. Large knee joint effusion, likely hemarthrosis. 3. Severe tricompartmental osteoarthritis of the left knee. Electronically Signed   By: Davina Poke D.O.   On: 02/19/2021 16:43   CT HEAD WO CONTRAST  Result Date: 02/28/2021 CLINICAL DATA:  Mental status changes of unknown cause. Acute delirium. EXAM: CT HEAD WITHOUT CONTRAST TECHNIQUE: Contiguous axial images were obtained from the base of the skull through the vertex without intravenous contrast. COMPARISON:  02/19/2021 FINDINGS: Brain: Age related volume loss. Chronic small-vessel ischemic  changes of the cerebral hemispheric white matter. No sign of acute infarction, mass lesion, hemorrhage, hydrocephalus or extra-axial collection. Vascular: There is atherosclerotic calcification of the major vessels at the base of the brain. Skull: Negative Sinuses/Orbits: Chronic right maxillary sinusitis.  Orbits negative. Other: None IMPRESSION: No acute intracranial finding. Atrophy and chronic small-vessel ischemic changes of the white matter Chronic right maxillary sinusitis. Electronically Signed   By: Nelson Chimes M.D.   On: 02/28/2021 19:44   CT Head Wo Contrast  Result Date: 02/19/2021 CLINICAL DATA:  Head trauma, loss of consciousness.  Altered, falls. EXAM: CT HEAD WITHOUT CONTRAST TECHNIQUE: Contiguous axial images were obtained from the base of the skull through the vertex without intravenous contrast. COMPARISON:  Brain MRI 08/19/2020.  Head CT 08/19/2020. FINDINGS: Brain: Mild generalized cerebral atrophy. Moderate patchy and ill-defined hypoattenuation within the cerebral white matter, nonspecific but compatible with chronic small vessel ischemic disease. There is no acute intracranial hemorrhage. No demarcated cortical infarct. No extra-axial fluid collection.  No evidence of intracranial mass. No midline shift. Vascular: No hyperdense vessel.  Atherosclerotic calcifications. Skull: Normal. Negative for fracture or focal lesion. Sinuses/Orbits: Visualized orbits show no acute finding. Fairly extensive partial opacification of the right maxillary sinus. Trace left maxillary sinus mucosal thickening. IMPRESSION: No evidence of acute intracranial abnormality. Moderate cerebral white matter chronic small vessel ischemic disease. Mild generalized parenchymal atrophy. Chronic right maxillary sinusitis. Electronically Signed   By: Kellie Simmering DO   On: 02/19/2021 14:12   MR BRAIN WO CONTRAST  Result Date: 03/02/2021 CLINICAL DATA:  Altered mental status EXAM: MRI HEAD WITHOUT CONTRAST TECHNIQUE: Multiplanar, multiecho pulse sequences of the brain and surrounding structures were obtained without intravenous contrast. COMPARISON:  December 2021 FINDINGS: Motion artifact is present. Brain: There is no acute infarction or intracranial hemorrhage. There is no intracranial mass, mass effect, or edema. There is no hydrocephalus or extra-axial fluid collection. Ventricles and sulci are stable in size and configuration. Patchy and confluent areas of T2 hyperintensity in the supratentorial white matter likely reflects stable chronic microvascular ischemic changes. Focus of susceptibility is again identified in the right cerebellum along the fourth ventricle most compatible with chronic microhemorrhage or occult cavernous malformation. Vascular: Major vessel flow voids at the skull base are preserved. Skull and upper cervical spine: Normal marrow signal is preserved. Sinuses/Orbits: Chronic right maxillary sinusitis. Bilateral lens replacements. Other: Sella is unremarkable.  Mastoid air cells are clear. IMPRESSION: Motion degradation. No evidence of recent infarction, hemorrhage, or mass. Stable chronic findings detailed above. Electronically Signed   By: Macy Mis M.D.   On:  03/02/2021 09:24   MR KNEE LEFT WO CONTRAST  Result Date: 03/01/2021 CLINICAL DATA:  Knee replacement, known infection s/p knee washout, increased knee pain, worsening confusion and white blood cell ct, EXAM: MRI OF THE LEFT KNEE WITHOUT CONTRAST TECHNIQUE: Multiplanar, multisequence MR imaging of the knee was performed. No intravenous contrast was administered. COMPARISON:  Left knee radiograph 02/19/2021 FINDINGS: Severe image degradation due to motion artifact. MENISCI Medial: Macerated appearance of the anterior horn and body of the medial meniscus with multidirectional tearing in the posterior horn. Lateral: Macerated anterior horn of the lateral meniscus with likely torn meniscal root and mild extrusion. LIGAMENTS Cruciates: The ACL is not well visualized and possibly chronically torn. Intact PCL. Collaterals: Medial collateral ligament is intact. Lateral collateral ligament complex is intact. CARTILAGE Patellofemoral:  Moderate-severe chondrosis. Medial:  Moderate-severe chondrosis Lateral: Severe chondrosis with full-thickness cartilage loss along the weight-bearing surfaces. JOINT:  Moderate-sized exudative appearing joint effusion with synovitis. POPLITEAL FOSSA: Moderate size Baker cyst. EXTENSOR MECHANISM: Intact quadriceps tendon. Intact patellar tendon. Intact lateral patellar retinaculum. Intact medial patellar retinaculum. Intact MPFL. BONES: Periarticular, subchondral bony edema favored to be related to severe degenerative arthritis. Other: Extensive soft tissue swelling along the knee including edema signal within the visualized musculature. IMPRESSION: Moderate-sized exudative appearing joint effusion with synovitis. Periarticular, subchondral bony edema, favored to be due to severe degenerative arthritis, though early changes of osteomyelitis is possible. Extensive soft tissue swelling along the knee including within the visualized musculature consistent with myositis. Severe tricompartment  osteoarthritis, worst in the lateral compartment with essentially full-thickness cartilage loss along the weight-bearing surfaces. Macerated appearance of the anterior horn and body of the medial meniscus with multidirectional degenerative tearing in the posterior horn. Macerated anterior horn of the lateral meniscus with torn meniscal root and mild extrusion. Poorly visualized ACL, possibly chronically torn, correlate for instability. Moderate size Baker cyst. Electronically Signed   By: Maurine Simmering   On: 03/01/2021 19:54   DG CHEST PORT 1 VIEW  Result Date: 02/26/2021 CLINICAL DATA:  Evaluate PICC line placement EXAM: PORTABLE CHEST 1 VIEW COMPARISON:  February 19, 2021 FINDINGS: A new right PICC line terminates in the central SVC. No pneumothorax. Stable cardiomegaly. No other changes. IMPRESSION: The new right PICC line terminates in the central SVC. No pneumothorax. No other changes. Electronically Signed   By: Dorise Bullion III M.D   On: 02/26/2021 15:22   DG Chest Port 1 View  Result Date: 02/19/2021 CLINICAL DATA:  Question sepsis EXAM: PORTABLE CHEST 1 VIEW COMPARISON:  12/05/2009. FINDINGS: The heart size and mediastinal contours are within normal limits. Atherosclerotic calcification aortic arch. Both lungs are clear. Degenerative change in both shoulders. IMPRESSION: No active disease. Electronically Signed   By: Franchot Gallo M.D.   On: 02/19/2021 14:25   EEG adult  Result Date: 03/02/2021 Lora Havens, MD     03/02/2021  5:11 PM Patient Name: Philip Mcclain MRN: [id number] Epilepsy Attending: Lora Havens Referring Physician/Provider: Dr Phillips Odor Date: 03/02/2021 Duration: 22.50 mins Patient history: 87yo M with ams. EEG to evaluate for seizure Level of alertness: Awake AEDs during EEG study: None Technical aspects: This EEG study was done with scalp electrodes positioned according to the 10-20 International system of electrode placement. Electrical activity was acquired at a  sampling rate of $Remov'500Hz'mgOOYp$  and reviewed with a high frequency filter of $RemoveB'70Hz'VWYVXTiQ$  and a low frequency filter of $RemoveB'1Hz'WJhIwtvt$ . EEG data were recorded continuously and digitally stored. Description: EEG showed continuous generalized 3 to 6 Hz theta-delta slowing. Intermittent generalized periodic discharges with triphasic morphology at  $R'1Hz'Vy$  were also noted. Hyperventilation and photic stimulation were not performed.   ABNORMALITY - Periodic discharges with triphasic morphology, generalized ( GPDs) - Continuous slow, generalized IMPRESSION: This study is suggestive of moderate diffuse encephalopathy, nonspecific etiology but likely related to toxic-metabolic causes. No seizures or definite epileptiform discharges were seen throughout the recording. Priyanka O Yadav   [place] EKG SITE RITE  Result Date: 02/26/2021 If [facility] image not attached, placement could not be confirmed due to current cardiac rhythm.   Orson Eva, DO  [facility]  If 7PM-7AM, please contact night-coverage [email or web address] Password [email or web address] 03/05/2021, 5:48 PM   LOS: 14 days

## 2021-03-05 NOTE — TOC Progression Note (Signed)
1Transition of Care [facility]) - Progression Note    Patient Details  Name: Philip Mcclain MRN: [id number] Date of Birth: 11/29/1933  [facility]) CM/SW Contact  Natasha Bence, LCSW Phone Number: 03/05/2021, 3:19 PM  Clinical Narrative:    CSW contacted Anderson Malta with [facility] to inquire if a [facility] bed is available. Anderson Malta reported that no bed availability today. Anderson Malta also reported that previously [facility] has been utilized for transportation. TOC to follow.   Expected Discharge Plan: Long Term Acute Care (LTAC) Barriers to Discharge: Continued Medical Work up  Expected Discharge Plan and Services Expected Discharge Plan: Long Term Acute Care (LTAC)     Post Acute Care Choice: [place] arrangements for the past 2 months: Single Family Home                                       Social Determinants of Health (SDOH) Interventions    Readmission Risk Interventions Readmission Risk Prevention Plan 03/04/2021  Transportation Screening Complete  PCP or Specialist Appt within 5-7 Days Complete  Home Care Screening Complete  Medication Review (RN CM) Complete  Some recent data might be hidden

## 2021-03-06 MED ORDER — CAMPHOR-MENTHOL 0.5-0.5 % EX LOTN
TOPICAL_LOTION | CUTANEOUS | Status: DC | PRN
  Filled 2021-03-06: qty 222

## 2021-03-06 NOTE — Progress Notes (Signed)
PROGRESS NOTE  IVEN EARNHART QIW:[id number] DOB: Aug 28, 1934 DOA: 02/19/2021 PCP: Glenda Chroman, MD    Brief History:  87 year old with past medical history of pulmonary hypertension, persistent atrial fibrillation on Eliquis [place] and hypertension seen by orthopedics in the outpatient setting and underwent left knee aspiration for steroid injection on June 14 with fluid findings consistent with gout, but also grew out Pseudomonas.  Brought into the emergency room with a swollen left knee and confusion on 6/18 and found to be in severe sepsis from infected left knee joint.  Patient started on IV fluids and antibiotics and admitted to the hospitalist service.  Orthopedics consulted and patient underwent left knee washout on 6/22.  Since then was having some severe knee pain over the next 24 hours and on night of 6/23, developed upper extremity tremors   Initially, knee pain and tremors have both improved, especially with discontinuation of morphine and Oxy IR.  In the last 2 days, patient a bit more confused.  Seems a little more lucid today although having significant knee pain.  Blood pressures have also trended up in the last few days with systolic in the 417E to 081K.  Head CT negative. Overall, the patient's mental status gradually improved despite being pleasantly confused.  Inflammatory markers suggested improving infectious process.   Assessment/Plan: Severe pseudomonal sepsis from septic joint of left knee joint [facility]):  -Patient met criteria for severe sepsis given tachycardia, tachypnea, leukocytosis with acute kidney injury, lactic acidosis, present on admission.   -Sepsis stabilized.  Lactic acid now normalized.  -knee washout 6/22.  Continue cefepime  Previous cultures grew out Pseudomonas and cultures from washout grew out sensitive Pseudomonas added Toradol x1 for knee pain in addition to his other pain medications.  Discussed with infectious disease who recommend 4 weeks of IV  antibiotics.  PICC line placed 6/25.  Infectious disease following.  Patient to be on cefepime 2 g twice a day for the next 3 weeks for total of 4 weeks.   -6/28 MR knee--mod size exudative effusion with synovitis.  Periarticular and subchondral bony edema;  extensive soft tissue edema -CRP 19.4>>5.2 -plan cefepime with last day on 03/21/21   Pulmonary HTN ([place]):  -Clinically compensated.   Essential hypertension:  -Blood pressures have been trending upward.    -Had responded with significant diuresis -hold additional lasix with uptrending creatinine and does not appear volume overloaded -Adding as needed hydralazine.     Persistent atrial fibrillation ([place]):  -Rate controlled.  -held apixaban temporarily -no further hematochezia==>restart apixaban and monitor   Acute toxic/metabolic Encephalopathy -multifactorial including infectious process, hyponatremia, increasing serum creatinine, and anticholinergic side effects -MRI brain neg for acute findings -B12--1453 -folate 14.3 -ammonia 13 -ABG--no hypoxia nor hypercarbia -UA--neg for pyuria -appreciate neurology input -restart IVF>>saline locked 7/1 -overall slowly improving each day, but remains pleasantly confused  Heme Positive stool -unclear if RN actually saw any hematochezia or melena -GI consult -held apixaban temporarily>>restarted and Hgb remained stable   AKI on chronic stage IIIb chronic kidney disease:  -Patient on admission with creatinine of 2.16, due to sepsis.   -baseline creatinine 1.4-1.5 -discontinue lasix IV -restarted gentle IVF-->now stable off IVF   Tremors -Possibly from morphine so this was discontinued.  Resolved   BPH -continue flomax -urinating fine   Acute hyponatremia:  -Secondary to hypovolemia and poor solute inake.      Status is: Inpatient   Remains inpatient appropriate because:Altered  mental status   Dispo: The patient is from: Home              Anticipated d/c is to: Home               Patient currently is not medically stable to d/c.              Difficult to place patient No               Family Communication:   daughter updated 7/3   Consultants:  neurology, ortho   Code Status: DNR   DVT Prophylaxis:  apixaban     Procedures: As Listed in Progress Note Above   Antibiotics: Cefepime 6/19>>>      Subjective: Patient denies fevers, chills, headache, chest pain, dyspnea, nausea, vomiting, diarrhea, abdominal pain, dysuria,  Objective: Vitals:   03/05/21 1300 03/05/21 2132 03/06/21 0346 03/06/21 1433  BP: (!) 142/88 (!) 147/67 129/72 (!) 121/57  Pulse: 66 (!) 58 60 (!) 51  Resp: $Remo'18 18 18   'dDIRx$ Temp: 98.5 F (36.9 C) 98 F (36.7 C) 98.6 F (37 C) 98.9 F (37.2 C)  TempSrc: Oral Oral Oral Oral  SpO2: 97% 99% 97% 99%  Weight:      Height:        Intake/Output Summary (Last 24 hours) at 03/06/2021 1609 Last data filed at 03/06/2021 0600 Gross per 24 hour  Intake 1040 ml  Output 450 ml  Net 590 ml   Weight change:  Exam:  General:  Pt is alert, follows commands appropriately, not in acute distress HEENT: No icterus, No thrush, No neck mass, [place]/AT Cardiovascular: RRR, S1/S2, no rubs, no gallops Respiratory: bibasilar rales. No wheeze Abdomen: Soft/+BS, non tender, non distended, no guarding Extremities: 1+LE edema, No lymphangitis, No petechiae, No rashes, no synovitis   Data Reviewed: I have personally reviewed following labs and imaging studies Basic Metabolic Panel: Recent Labs  Lab 03/01/21 0504 03/02/21 0036 03/03/21 0421 03/04/21 0602 03/05/21 0659  NA 131* 131* 131* 133* 133*  K 3.4* 3.5 3.6 3.8 4.2  CL 106 105 105 107 107  CO2 21* 20* 21* 18* 17*  GLUCOSE 97 96 92 98 94  BUN 33* 34* 34* 36* 35*  CREATININE 1.64* 1.84* 2.01* 2.07* 2.02*  CALCIUM 9.1 8.8* 8.9 9.0 9.3  MG  --   --  1.5*  --  1.8   Liver Function Tests: Recent Labs  Lab 03/01/21 0504 03/02/21 0036 03/03/21 0421  AST 14* 16 15  ALT $Re'13 14 12   'yJc$ ALKPHOS 93 98 94  BILITOT 0.4 0.5 0.5  PROT 5.0* 5.3* 5.2*  ALBUMIN 2.0* 2.1* 2.0*   No results for input(s): LIPASE, AMYLASE in the last 168 hours. Recent Labs  Lab 03/01/21 1516  AMMONIA 13   Coagulation Profile: No results for input(s): INR, PROTIME in the last 168 hours. CBC: Recent Labs  Lab 02/28/21 0529 03/01/21 0504 03/02/21 0036 03/03/21 0315 03/04/21 0602 03/05/21 0659  WBC 17.1* 13.6* 14.0*  --  10.0 11.6*  HGB 9.9* 8.8* 8.8* 8.6* 7.9* 8.3*  HCT 29.7* 27.1* 26.9* 26.7* 24.1* 25.6*  MCV 94.6 94.4 94.7  --  96.0 96.6  PLT 308 266 235  --  201 214   Cardiac Enzymes: No results for input(s): CKTOTAL, CKMB, CKMBINDEX, TROPONINI in the last 168 hours. BNP: Invalid input(s): POCBNP CBG: No results for input(s): GLUCAP in the last 168 hours. HbA1C: No results for input(s): HGBA1C in the last 72 hours.  Urine analysis:    Component Value Date/Time   COLORURINE YELLOW 02/28/2021 1020   APPEARANCEUR CLEAR 02/28/2021 1020   APPEARANCEUR Clear 01/17/2021 1342   LABSPEC 1.011 02/28/2021 1020   PHURINE 5.0 02/28/2021 1020   GLUCOSEU NEGATIVE 02/28/2021 1020   HGBUR MODERATE (A) 02/28/2021 1020   BILIRUBINUR NEGATIVE 02/28/2021 1020   BILIRUBINUR Negative 01/17/2021 [place] 02/28/2021 1020   PROTEINUR 30 (A) 02/28/2021 1020   NITRITE NEGATIVE 02/28/2021 1020   LEUKOCYTESUR NEGATIVE 02/28/2021 1020   Sepsis Labs: $RemoveBefo'@LABRCNTIP'QBtPPuogpBq$ (procalcitonin:4,lacticidven:4) )No results found for this or any previous visit (from the past 240 hour(s)).   Scheduled Meds:  acetaminophen  650 mg Oral Q6H   allopurinol  100 mg Oral Daily   apixaban  2.5 mg Oral BID   Chlorhexidine Gluconate Cloth  6 each Topical Daily   docusate sodium  100 mg Oral BID   finasteride  5 mg Oral Daily   metoprolol succinate  25 mg Oral BID   pantoprazole  40 mg Oral QPM   sodium chloride flush  10-40 mL Intracatheter Q12H   tamsulosin  0.4 mg Oral BID   Continuous Infusions:   ceFEPime (MAXIPIME) IV 2 g (03/06/21 0951)    Procedures/Studies: DG Pelvis 1-2 Views  Result Date: 02/19/2021 CLINICAL DATA:  Fall today EXAM: PELVIS - 1-2 VIEW COMPARISON:  None. FINDINGS: There is no evidence of pelvic fracture or diastasis. No pelvic bone lesions are seen. Atherosclerotic calcification. IMPRESSION: Negative. Electronically Signed   By: Franchot Gallo M.D.   On: 02/19/2021 15:50   DG Knee 1-2 Views Left  Result Date: 02/19/2021 CLINICAL DATA:  Left lower leg pain and swelling after fall EXAM: LEFT TIBIA AND FIBULA - 2 VIEW; LEFT KNEE - 1-2 VIEW COMPARISON:  None FINDINGS: Subtle cortical irregularity involving the peripheral aspect of the lateral tibial plateau articular surface suspicious for tibial plateau fracture. There is a large knee joint effusion. Severe tricompartmental osteoarthritis, most pronounced laterally. Ossification within the region of the distal patellar tendon. Remainder of the tibia and fibula are intact. Ankle mortise appears congruent. Advanced degenerative changes within the midfoot. Diffuse soft tissue edema, nonspecific. Extensive severe vascular calcifications. IMPRESSION: 1. Findings suspicious for a non-depressed lateral tibial plateau fracture. 2. Large knee joint effusion, likely hemarthrosis. 3. Severe tricompartmental osteoarthritis of the left knee. Electronically Signed   By: Davina Poke D.O.   On: 02/19/2021 16:43   DG Tibia/Fibula Left  Result Date: 02/19/2021 CLINICAL DATA:  Left lower leg pain and swelling after fall EXAM: LEFT TIBIA AND FIBULA - 2 VIEW; LEFT KNEE - 1-2 VIEW COMPARISON:  None FINDINGS: Subtle cortical irregularity involving the peripheral aspect of the lateral tibial plateau articular surface suspicious for tibial plateau fracture. There is a large knee joint effusion. Severe tricompartmental osteoarthritis, most pronounced laterally. Ossification within the region of the distal patellar tendon. Remainder of the tibia and  fibula are intact. Ankle mortise appears congruent. Advanced degenerative changes within the midfoot. Diffuse soft tissue edema, nonspecific. Extensive severe vascular calcifications. IMPRESSION: 1. Findings suspicious for a non-depressed lateral tibial plateau fracture. 2. Large knee joint effusion, likely hemarthrosis. 3. Severe tricompartmental osteoarthritis of the left knee. Electronically Signed   By: Davina Poke D.O.   On: 02/19/2021 16:43   CT HEAD WO CONTRAST  Result Date: 02/28/2021 CLINICAL DATA:  Mental status changes of unknown cause. Acute delirium. EXAM: CT HEAD WITHOUT CONTRAST TECHNIQUE: Contiguous axial images were obtained from the base of the skull through  the vertex without intravenous contrast. COMPARISON:  02/19/2021 FINDINGS: Brain: Age related volume loss. Chronic small-vessel ischemic changes of the cerebral hemispheric white matter. No sign of acute infarction, mass lesion, hemorrhage, hydrocephalus or extra-axial collection. Vascular: There is atherosclerotic calcification of the major vessels at the base of the brain. Skull: Negative Sinuses/Orbits: Chronic right maxillary sinusitis.  Orbits negative. Other: None IMPRESSION: No acute intracranial finding. Atrophy and chronic small-vessel ischemic changes of the white matter Chronic right maxillary sinusitis. Electronically Signed   By: Nelson Chimes M.D.   On: 02/28/2021 19:44   CT Head Wo Contrast  Result Date: 02/19/2021 CLINICAL DATA:  Head trauma, loss of consciousness.  Altered, falls. EXAM: CT HEAD WITHOUT CONTRAST TECHNIQUE: Contiguous axial images were obtained from the base of the skull through the vertex without intravenous contrast. COMPARISON:  Brain MRI 08/19/2020.  Head CT 08/19/2020. FINDINGS: Brain: Mild generalized cerebral atrophy. Moderate patchy and ill-defined hypoattenuation within the cerebral white matter, nonspecific but compatible with chronic small vessel ischemic disease. There is no acute  intracranial hemorrhage. No demarcated cortical infarct. No extra-axial fluid collection. No evidence of intracranial mass. No midline shift. Vascular: No hyperdense vessel.  Atherosclerotic calcifications. Skull: Normal. Negative for fracture or focal lesion. Sinuses/Orbits: Visualized orbits show no acute finding. Fairly extensive partial opacification of the right maxillary sinus. Trace left maxillary sinus mucosal thickening. IMPRESSION: No evidence of acute intracranial abnormality. Moderate cerebral white matter chronic small vessel ischemic disease. Mild generalized parenchymal atrophy. Chronic right maxillary sinusitis. Electronically Signed   By: Kellie Simmering DO   On: 02/19/2021 14:12   MR BRAIN WO CONTRAST  Result Date: 03/02/2021 CLINICAL DATA:  Altered mental status EXAM: MRI HEAD WITHOUT CONTRAST TECHNIQUE: Multiplanar, multiecho pulse sequences of the brain and surrounding structures were obtained without intravenous contrast. COMPARISON:  December 2021 FINDINGS: Motion artifact is present. Brain: There is no acute infarction or intracranial hemorrhage. There is no intracranial mass, mass effect, or edema. There is no hydrocephalus or extra-axial fluid collection. Ventricles and sulci are stable in size and configuration. Patchy and confluent areas of T2 hyperintensity in the supratentorial white matter likely reflects stable chronic microvascular ischemic changes. Focus of susceptibility is again identified in the right cerebellum along the fourth ventricle most compatible with chronic microhemorrhage or occult cavernous malformation. Vascular: Major vessel flow voids at the skull base are preserved. Skull and upper cervical spine: Normal marrow signal is preserved. Sinuses/Orbits: Chronic right maxillary sinusitis. Bilateral lens replacements. Other: Sella is unremarkable.  Mastoid air cells are clear. IMPRESSION: Motion degradation. No evidence of recent infarction, hemorrhage, or mass. Stable  chronic findings detailed above. Electronically Signed   By: Macy Mis M.D.   On: 03/02/2021 09:24   MR KNEE LEFT WO CONTRAST  Result Date: 03/01/2021 CLINICAL DATA:  Knee replacement, known infection s/p knee washout, increased knee pain, worsening confusion and white blood cell ct, EXAM: MRI OF THE LEFT KNEE WITHOUT CONTRAST TECHNIQUE: Multiplanar, multisequence MR imaging of the knee was performed. No intravenous contrast was administered. COMPARISON:  Left knee radiograph 02/19/2021 FINDINGS: Severe image degradation due to motion artifact. MENISCI Medial: Macerated appearance of the anterior horn and body of the medial meniscus with multidirectional tearing in the posterior horn. Lateral: Macerated anterior horn of the lateral meniscus with likely torn meniscal root and mild extrusion. LIGAMENTS Cruciates: The ACL is not well visualized and possibly chronically torn. Intact PCL. Collaterals: Medial collateral ligament is intact. Lateral collateral ligament complex is intact. CARTILAGE Patellofemoral:  Moderate-severe  chondrosis. Medial:  Moderate-severe chondrosis Lateral: Severe chondrosis with full-thickness cartilage loss along the weight-bearing surfaces. JOINT: Moderate-sized exudative appearing joint effusion with synovitis. POPLITEAL FOSSA: Moderate size Baker cyst. EXTENSOR MECHANISM: Intact quadriceps tendon. Intact patellar tendon. Intact lateral patellar retinaculum. Intact medial patellar retinaculum. Intact MPFL. BONES: Periarticular, subchondral bony edema favored to be related to severe degenerative arthritis. Other: Extensive soft tissue swelling along the knee including edema signal within the visualized musculature. IMPRESSION: Moderate-sized exudative appearing joint effusion with synovitis. Periarticular, subchondral bony edema, favored to be due to severe degenerative arthritis, though early changes of osteomyelitis is possible. Extensive soft tissue swelling along the knee  including within the visualized musculature consistent with myositis. Severe tricompartment osteoarthritis, worst in the lateral compartment with essentially full-thickness cartilage loss along the weight-bearing surfaces. Macerated appearance of the anterior horn and body of the medial meniscus with multidirectional degenerative tearing in the posterior horn. Macerated anterior horn of the lateral meniscus with torn meniscal root and mild extrusion. Poorly visualized ACL, possibly chronically torn, correlate for instability. Moderate size Baker cyst. Electronically Signed   By: Maurine Simmering   On: 03/01/2021 19:54   DG CHEST PORT 1 VIEW  Result Date: 02/26/2021 CLINICAL DATA:  Evaluate PICC line placement EXAM: PORTABLE CHEST 1 VIEW COMPARISON:  February 19, 2021 FINDINGS: A new right PICC line terminates in the central SVC. No pneumothorax. Stable cardiomegaly. No other changes. IMPRESSION: The new right PICC line terminates in the central SVC. No pneumothorax. No other changes. Electronically Signed   By: Dorise Bullion III M.D   On: 02/26/2021 15:22   DG Chest Port 1 View  Result Date: 02/19/2021 CLINICAL DATA:  Question sepsis EXAM: PORTABLE CHEST 1 VIEW COMPARISON:  12/05/2009. FINDINGS: The heart size and mediastinal contours are within normal limits. Atherosclerotic calcification aortic arch. Both lungs are clear. Degenerative change in both shoulders. IMPRESSION: No active disease. Electronically Signed   By: Franchot Gallo M.D.   On: 02/19/2021 14:25   EEG adult  Result Date: 03/02/2021 Lora Havens, MD     03/02/2021  5:11 PM Patient Name: CONSTANCE HACKENBERG MRN: [id number] Epilepsy Attending: Lora Havens Referring Physician/Provider: Dr Phillips Odor Date: 03/02/2021 Duration: 22.50 mins Patient history: 87yo M with ams. EEG to evaluate for seizure Level of alertness: Awake AEDs during EEG study: None Technical aspects: This EEG study was done with scalp electrodes positioned according to the  10-20 International system of electrode placement. Electrical activity was acquired at a sampling rate of $Remov'500Hz'wVqxEi$  and reviewed with a high frequency filter of $RemoveB'70Hz'pgmthUKy$  and a low frequency filter of $RemoveB'1Hz'sTCWtUKF$ . EEG data were recorded continuously and digitally stored. Description: EEG showed continuous generalized 3 to 6 Hz theta-delta slowing. Intermittent generalized periodic discharges with triphasic morphology at  $R'1Hz'iQ$  were also noted. Hyperventilation and photic stimulation were not performed.   ABNORMALITY - Periodic discharges with triphasic morphology, generalized ( GPDs) - Continuous slow, generalized IMPRESSION: This study is suggestive of moderate diffuse encephalopathy, nonspecific etiology but likely related to toxic-metabolic causes. No seizures or definite epileptiform discharges were seen throughout the recording. Priyanka O Yadav   [place] EKG SITE RITE  Result Date: 02/26/2021 If [facility] image not attached, placement could not be confirmed due to current cardiac rhythm.   Orson Eva, DO  [facility]  If 7PM-7AM, please contact night-coverage [email or web address] Password [email or web address] 03/06/2021, 4:09 PM   LOS: 15 days

## 2021-03-06 NOTE — Progress Notes (Signed)
Received call from central tele reporting that pt was having episodes of HR down to upper 30's and 40's. Upon exam, pt sleeping soundly, resp deep and even. Easily awakened, denies c/o, VSS.

## 2021-03-07 LAB — BASIC METABOLIC PANEL
Anion gap: 6 (ref 5–15)
BUN: 36 mg/dL — ABNORMAL HIGH (ref 8–23)
CO2: 17 mmol/L — ABNORMAL LOW (ref 22–32)
Calcium: 9.1 mg/dL (ref 8.9–10.3)
Chloride: 106 mmol/L (ref 98–111)
Creatinine, Ser: 2.25 mg/dL — ABNORMAL HIGH (ref 0.61–1.24)
GFR, Estimated: 28 mL/min — ABNORMAL LOW (ref >60)
Glucose, Bld: 88 mg/dL (ref 70–99)
Potassium: 4.1 mmol/L (ref 3.5–5.1)
Sodium: 129 mmol/L — ABNORMAL LOW (ref 135–145)

## 2021-03-07 LAB — CBC
HCT: 25.1 % — ABNORMAL LOW (ref 39.0–52.0)
Hemoglobin: 8.1 g/dL — ABNORMAL LOW (ref 13.0–17.0)
MCH: 31.3 pg (ref 26.0–34.0)
MCHC: 32.3 g/dL (ref 30.0–36.0)
MCV: 96.9 fL (ref 80.0–100.0)
Platelets: 189 10*3/uL (ref 150–400)
RBC: 2.59 MIL/uL — ABNORMAL LOW (ref 4.22–5.81)
RDW: 16.1 % — ABNORMAL HIGH (ref 11.5–15.5)
WBC: 8.5 10*3/uL (ref 4.0–10.5)
nRBC: 0 % (ref 0.0–0.2)

## 2021-03-07 LAB — OSMOLALITY: Osmolality: 292 mOsm/kg (ref 275–295)

## 2021-03-07 LAB — MAGNESIUM: Magnesium: 1.7 mg/dL (ref 1.7–2.4)

## 2021-03-07 MED ORDER — SODIUM CHLORIDE 0.9 % IV SOLN: INTRAVENOUS | Status: AC

## 2021-03-07 MED ORDER — MAGNESIUM SULFATE 2 GM/50ML IV SOLN
2.0000 g | Freq: Once | INTRAVENOUS | Status: AC
  Administered 2021-03-07: 2 g via INTRAVENOUS
  Filled 2021-03-07: qty 50

## 2021-03-07 NOTE — Progress Notes (Signed)
Tele called patient had a 2.01 slow ventricular response. Made MD Tat aware.

## 2021-03-07 NOTE — Progress Notes (Signed)
PROGRESS NOTE  Philip Mcclain PPI:[id number] DOB: 15-Apr-1934 DOA: 02/19/2021 PCP: Glenda Chroman, MD  Brief History:  87 year old with past medical history of pulmonary hypertension, persistent atrial fibrillation on Eliquis [place] and hypertension seen by orthopedics in the outpatient setting and underwent left knee aspiration for steroid injection on June 14 with fluid findings consistent with gout, but also grew out Pseudomonas.  Brought into the emergency room with a swollen left knee and confusion on 6/18 and found to be in severe sepsis from infected left knee joint.  Patient started on IV fluids and antibiotics and admitted to the hospitalist service.  Orthopedics consulted and patient underwent left knee washout on 6/22.  Since then was having some severe knee pain over the next 24 hours and on night of 6/23, developed upper extremity tremors   Initially, knee pain and tremors have both improved, especially with discontinuation of morphine and Oxy IR.  In the last 2 days, patient a bit more confused.  Seems a little more lucid today although having significant knee pain.  Blood pressures have also trended up in the last few days with systolic in the 063K to 160F.  Head CT negative. Overall, the patient's mental status gradually improved despite being pleasantly confused.  Inflammatory markers suggested improving infectious process.   Assessment/Plan: Severe pseudomonal sepsis from septic joint of left knee joint [facility]):  -Patient met criteria for severe sepsis given tachycardia, tachypnea, leukocytosis with acute kidney injury, lactic acidosis, present on admission.   -Sepsis stabilized.  Lactic acid now normalized.  -knee washout 6/22.  Continue cefepime  Previous cultures grew out Pseudomonas and cultures from washout grew out sensitive Pseudomonas added Toradol x1 for knee pain in addition to his other pain medications.  Discussed with infectious disease who recommend 4 weeks of IV  antibiotics.  PICC line placed 6/25.  Infectious disease following.  Patient to be on cefepime 2 g twice a day for the next 3 weeks for total of 4 weeks.   -6/28 MR knee--mod size exudative effusion with synovitis.  Periarticular and subchondral bony edema;  extensive soft tissue edema -CRP 19.4>>5.2 -plan cefepime with last day on 03/21/21   Pulmonary HTN ([place]):  -Clinically compensated.   Essential hypertension:  -Blood pressures have been trending upward.    -Had responded with significant diuresis -hold additional lasix with uptrending creatinine and does not appear volume overloaded -Adding as needed hydralazine.     Persistent atrial fibrillation ([place]):  -Rate controlled.  -continue metoprolol succinate -held apixaban temporarily -no further hematochezia==>restart apixaban and monitor   Acute toxic/metabolic Encephalopathy -multifactorial including infectious process, hyponatremia, increasing serum creatinine, and anticholinergic side effects -MRI brain neg for acute findings -B12--1453 -folate 14.3 -ammonia 13 -ABG--no hypoxia nor hypercarbia -UA--neg for pyuria -appreciate neurology input -restart IVF>>saline locked 7/1 -overall slowly improving each day, but remains pleasantly confused   Heme Positive stool -unclear if RN actually saw any hematochezia or melena -GI consult -held apixaban temporarily>>restarted and Hgb remained stable   AKI on chronic stage IIIb chronic kidney disease:  -Patient on admission with creatinine of 2.16, due to sepsis.   -baseline creatinine 1.4-1.5 -discontinue lasix IV -restarted gentle IVF-->now stable off IVF   Tremors -Possibly from morphine so this was discontinued.  Resolved   BPH -continue flomax -urinating fine   Acute hyponatremia:  -Secondary to hypovolemia and poor solute inake.  -urine osm -serum osm -urine Na -urine Cr  Status is: Inpatient   Remains inpatient appropriate because:Altered mental status    Dispo: The patient is from: Home              Anticipated d/c is to: Home              Patient currently is not medically stable to d/c.              Difficult to place patient No               Family Communication:   daughter updated 7/4   Consultants:  neurology, ortho   Code Status: DNR   DVT Prophylaxis:  apixaban     Procedures: As Listed in Progress Note Above   Antibiotics: Cefepime 6/19>>>  Subjective: Patient denies fevers, chills, headache, chest pain, dyspnea, nausea, vomiting, diarrhea, abdominal pain, dysuria.  He complains of right knee pain.  Objective: Vitals:   03/06/21 1615 03/06/21 2215 03/07/21 0554 03/07/21 0819  BP: (!) 130/50 136/65 135/65 (!) 145/63  Pulse: 60 (!) 59 64 65  Resp: $Remo'16 18 18   'aCqFa$ Temp:  98.6 F (37 C) 98.2 F (36.8 C)   TempSrc:   Oral   SpO2: 100% 97% 98%   Weight:      Height:        Intake/Output Summary (Last 24 hours) at 03/07/2021 1251 Last data filed at 03/07/2021 1100 Gross per 24 hour  Intake 2090 ml  Output 1275 ml  Net 815 ml   Weight change:  Exam:  General:  Pt is alert, follows commands appropriately, not in acute distress HEENT: No icterus, No thrush, No neck mass, [place]/AT Cardiovascular: RRR, S1/S2, no rubs, no gallops Respiratory: bibasilar rales. No wheeze Abdomen: Soft/+BS, non tender, non distended, no guarding Extremities: trace LE edema, No lymphangitis, No petechiae, No rashes, no synovitis   Data Reviewed: I have personally reviewed following labs and imaging studies Basic Metabolic Panel: Recent Labs  Lab 03/02/21 0036 03/03/21 0421 03/04/21 0602 03/05/21 0659 03/07/21 0705  NA 131* 131* 133* 133* 129*  K 3.5 3.6 3.8 4.2 4.1  CL 105 105 107 107 106  CO2 20* 21* 18* 17* 17*  GLUCOSE 96 92 98 94 88  BUN 34* 34* 36* 35* 36*  CREATININE 1.84* 2.01* 2.07* 2.02* 2.25*  CALCIUM 8.8* 8.9 9.0 9.3 9.1  MG  --  1.5*  --  1.8 1.7   Liver Function Tests: Recent Labs  Lab 03/01/21 0504  03/02/21 0036 03/03/21 0421  AST 14* 16 15  ALT $Re'13 14 12  'aFr$ ALKPHOS 93 98 94  BILITOT 0.4 0.5 0.5  PROT 5.0* 5.3* 5.2*  ALBUMIN 2.0* 2.1* 2.0*   No results for input(s): LIPASE, AMYLASE in the last 168 hours. Recent Labs  Lab 03/01/21 1516  AMMONIA 13   Coagulation Profile: No results for input(s): INR, PROTIME in the last 168 hours. CBC: Recent Labs  Lab 03/01/21 0504 03/02/21 0036 03/03/21 0315 03/04/21 0602 03/05/21 0659 03/07/21 0705  WBC 13.6* 14.0*  --  10.0 11.6* 8.5  HGB 8.8* 8.8* 8.6* 7.9* 8.3* 8.1*  HCT 27.1* 26.9* 26.7* 24.1* 25.6* 25.1*  MCV 94.4 94.7  --  96.0 96.6 96.9  PLT 266 235  --  201 214 189   Cardiac Enzymes: No results for input(s): CKTOTAL, CKMB, CKMBINDEX, TROPONINI in the last 168 hours. BNP: Invalid input(s): POCBNP CBG: No results for input(s): GLUCAP in the last 168 hours. HbA1C: No results for input(s): HGBA1C in the  last 72 hours. Urine analysis:    Component Value Date/Time   COLORURINE YELLOW 02/28/2021 1020   APPEARANCEUR CLEAR 02/28/2021 1020   APPEARANCEUR Clear 01/17/2021 1342   LABSPEC 1.011 02/28/2021 1020   PHURINE 5.0 02/28/2021 1020   GLUCOSEU NEGATIVE 02/28/2021 1020   HGBUR MODERATE (A) 02/28/2021 1020   BILIRUBINUR NEGATIVE 02/28/2021 1020   BILIRUBINUR Negative 01/17/2021 [place] 02/28/2021 1020   PROTEINUR 30 (A) 02/28/2021 1020   NITRITE NEGATIVE 02/28/2021 1020   LEUKOCYTESUR NEGATIVE 02/28/2021 1020   Sepsis Labs: $RemoveBefo'@LABRCNTIP'LhmpTkOxFXi$ (procalcitonin:4,lacticidven:4) )No results found for this or any previous visit (from the past 240 hour(s)).   Scheduled Meds:  acetaminophen  650 mg Oral Q6H   allopurinol  100 mg Oral Daily   apixaban  2.5 mg Oral BID   Chlorhexidine Gluconate Cloth  6 each Topical Daily   docusate sodium  100 mg Oral BID   finasteride  5 mg Oral Daily   metoprolol succinate  25 mg Oral BID   pantoprazole  40 mg Oral QPM   sodium chloride flush  10-40 mL Intracatheter Q12H    tamsulosin  0.4 mg Oral BID   Continuous Infusions:  sodium chloride     ceFEPime (MAXIPIME) IV 2 g (03/07/21 0832)    Procedures/Studies: DG Pelvis 1-2 Views  Result Date: 02/19/2021 CLINICAL DATA:  Fall today EXAM: PELVIS - 1-2 VIEW COMPARISON:  None. FINDINGS: There is no evidence of pelvic fracture or diastasis. No pelvic bone lesions are seen. Atherosclerotic calcification. IMPRESSION: Negative. Electronically Signed   By: Franchot Gallo M.D.   On: 02/19/2021 15:50   DG Knee 1-2 Views Left  Result Date: 02/19/2021 CLINICAL DATA:  Left lower leg pain and swelling after fall EXAM: LEFT TIBIA AND FIBULA - 2 VIEW; LEFT KNEE - 1-2 VIEW COMPARISON:  None FINDINGS: Subtle cortical irregularity involving the peripheral aspect of the lateral tibial plateau articular surface suspicious for tibial plateau fracture. There is a large knee joint effusion. Severe tricompartmental osteoarthritis, most pronounced laterally. Ossification within the region of the distal patellar tendon. Remainder of the tibia and fibula are intact. Ankle mortise appears congruent. Advanced degenerative changes within the midfoot. Diffuse soft tissue edema, nonspecific. Extensive severe vascular calcifications. IMPRESSION: 1. Findings suspicious for a non-depressed lateral tibial plateau fracture. 2. Large knee joint effusion, likely hemarthrosis. 3. Severe tricompartmental osteoarthritis of the left knee. Electronically Signed   By: Davina Poke D.O.   On: 02/19/2021 16:43   DG Tibia/Fibula Left  Result Date: 02/19/2021 CLINICAL DATA:  Left lower leg pain and swelling after fall EXAM: LEFT TIBIA AND FIBULA - 2 VIEW; LEFT KNEE - 1-2 VIEW COMPARISON:  None FINDINGS: Subtle cortical irregularity involving the peripheral aspect of the lateral tibial plateau articular surface suspicious for tibial plateau fracture. There is a large knee joint effusion. Severe tricompartmental osteoarthritis, most pronounced laterally.  Ossification within the region of the distal patellar tendon. Remainder of the tibia and fibula are intact. Ankle mortise appears congruent. Advanced degenerative changes within the midfoot. Diffuse soft tissue edema, nonspecific. Extensive severe vascular calcifications. IMPRESSION: 1. Findings suspicious for a non-depressed lateral tibial plateau fracture. 2. Large knee joint effusion, likely hemarthrosis. 3. Severe tricompartmental osteoarthritis of the left knee. Electronically Signed   By: Davina Poke D.O.   On: 02/19/2021 16:43   CT HEAD WO CONTRAST  Result Date: 02/28/2021 CLINICAL DATA:  Mental status changes of unknown cause. Acute delirium. EXAM: CT HEAD WITHOUT CONTRAST TECHNIQUE: Contiguous axial images  were obtained from the base of the skull through the vertex without intravenous contrast. COMPARISON:  02/19/2021 FINDINGS: Brain: Age related volume loss. Chronic small-vessel ischemic changes of the cerebral hemispheric white matter. No sign of acute infarction, mass lesion, hemorrhage, hydrocephalus or extra-axial collection. Vascular: There is atherosclerotic calcification of the major vessels at the base of the brain. Skull: Negative Sinuses/Orbits: Chronic right maxillary sinusitis.  Orbits negative. Other: None IMPRESSION: No acute intracranial finding. Atrophy and chronic small-vessel ischemic changes of the white matter Chronic right maxillary sinusitis. Electronically Signed   By: Nelson Chimes M.D.   On: 02/28/2021 19:44   CT Head Wo Contrast  Result Date: 02/19/2021 CLINICAL DATA:  Head trauma, loss of consciousness.  Altered, falls. EXAM: CT HEAD WITHOUT CONTRAST TECHNIQUE: Contiguous axial images were obtained from the base of the skull through the vertex without intravenous contrast. COMPARISON:  Brain MRI 08/19/2020.  Head CT 08/19/2020. FINDINGS: Brain: Mild generalized cerebral atrophy. Moderate patchy and ill-defined hypoattenuation within the cerebral white matter,  nonspecific but compatible with chronic small vessel ischemic disease. There is no acute intracranial hemorrhage. No demarcated cortical infarct. No extra-axial fluid collection. No evidence of intracranial mass. No midline shift. Vascular: No hyperdense vessel.  Atherosclerotic calcifications. Skull: Normal. Negative for fracture or focal lesion. Sinuses/Orbits: Visualized orbits show no acute finding. Fairly extensive partial opacification of the right maxillary sinus. Trace left maxillary sinus mucosal thickening. IMPRESSION: No evidence of acute intracranial abnormality. Moderate cerebral white matter chronic small vessel ischemic disease. Mild generalized parenchymal atrophy. Chronic right maxillary sinusitis. Electronically Signed   By: Kellie Simmering DO   On: 02/19/2021 14:12   MR BRAIN WO CONTRAST  Result Date: 03/02/2021 CLINICAL DATA:  Altered mental status EXAM: MRI HEAD WITHOUT CONTRAST TECHNIQUE: Multiplanar, multiecho pulse sequences of the brain and surrounding structures were obtained without intravenous contrast. COMPARISON:  December 2021 FINDINGS: Motion artifact is present. Brain: There is no acute infarction or intracranial hemorrhage. There is no intracranial mass, mass effect, or edema. There is no hydrocephalus or extra-axial fluid collection. Ventricles and sulci are stable in size and configuration. Patchy and confluent areas of T2 hyperintensity in the supratentorial white matter likely reflects stable chronic microvascular ischemic changes. Focus of susceptibility is again identified in the right cerebellum along the fourth ventricle most compatible with chronic microhemorrhage or occult cavernous malformation. Vascular: Major vessel flow voids at the skull base are preserved. Skull and upper cervical spine: Normal marrow signal is preserved. Sinuses/Orbits: Chronic right maxillary sinusitis. Bilateral lens replacements. Other: Sella is unremarkable.  Mastoid air cells are clear.  IMPRESSION: Motion degradation. No evidence of recent infarction, hemorrhage, or mass. Stable chronic findings detailed above. Electronically Signed   By: Macy Mis M.D.   On: 03/02/2021 09:24   MR KNEE LEFT WO CONTRAST  Result Date: 03/01/2021 CLINICAL DATA:  Knee replacement, known infection s/p knee washout, increased knee pain, worsening confusion and white blood cell ct, EXAM: MRI OF THE LEFT KNEE WITHOUT CONTRAST TECHNIQUE: Multiplanar, multisequence MR imaging of the knee was performed. No intravenous contrast was administered. COMPARISON:  Left knee radiograph 02/19/2021 FINDINGS: Severe image degradation due to motion artifact. MENISCI Medial: Macerated appearance of the anterior horn and body of the medial meniscus with multidirectional tearing in the posterior horn. Lateral: Macerated anterior horn of the lateral meniscus with likely torn meniscal root and mild extrusion. LIGAMENTS Cruciates: The ACL is not well visualized and possibly chronically torn. Intact PCL. Collaterals: Medial collateral ligament is intact. Lateral  collateral ligament complex is intact. CARTILAGE Patellofemoral:  Moderate-severe chondrosis. Medial:  Moderate-severe chondrosis Lateral: Severe chondrosis with full-thickness cartilage loss along the weight-bearing surfaces. JOINT: Moderate-sized exudative appearing joint effusion with synovitis. POPLITEAL FOSSA: Moderate size Baker cyst. EXTENSOR MECHANISM: Intact quadriceps tendon. Intact patellar tendon. Intact lateral patellar retinaculum. Intact medial patellar retinaculum. Intact MPFL. BONES: Periarticular, subchondral bony edema favored to be related to severe degenerative arthritis. Other: Extensive soft tissue swelling along the knee including edema signal within the visualized musculature. IMPRESSION: Moderate-sized exudative appearing joint effusion with synovitis. Periarticular, subchondral bony edema, favored to be due to severe degenerative arthritis, though  early changes of osteomyelitis is possible. Extensive soft tissue swelling along the knee including within the visualized musculature consistent with myositis. Severe tricompartment osteoarthritis, worst in the lateral compartment with essentially full-thickness cartilage loss along the weight-bearing surfaces. Macerated appearance of the anterior horn and body of the medial meniscus with multidirectional degenerative tearing in the posterior horn. Macerated anterior horn of the lateral meniscus with torn meniscal root and mild extrusion. Poorly visualized ACL, possibly chronically torn, correlate for instability. Moderate size Baker cyst. Electronically Signed   By: Maurine Simmering   On: 03/01/2021 19:54   DG CHEST PORT 1 VIEW  Result Date: 02/26/2021 CLINICAL DATA:  Evaluate PICC line placement EXAM: PORTABLE CHEST 1 VIEW COMPARISON:  February 19, 2021 FINDINGS: A new right PICC line terminates in the central SVC. No pneumothorax. Stable cardiomegaly. No other changes. IMPRESSION: The new right PICC line terminates in the central SVC. No pneumothorax. No other changes. Electronically Signed   By: Dorise Bullion III M.D   On: 02/26/2021 15:22   DG Chest Port 1 View  Result Date: 02/19/2021 CLINICAL DATA:  Question sepsis EXAM: PORTABLE CHEST 1 VIEW COMPARISON:  12/05/2009. FINDINGS: The heart size and mediastinal contours are within normal limits. Atherosclerotic calcification aortic arch. Both lungs are clear. Degenerative change in both shoulders. IMPRESSION: No active disease. Electronically Signed   By: Franchot Gallo M.D.   On: 02/19/2021 14:25   EEG adult  Result Date: 03/02/2021 Lora Havens, MD     03/02/2021  5:11 PM Patient Name: SANDY TORIVIO MRN: [id number] Epilepsy Attending: Lora Havens Referring Physician/Provider: Dr Phillips Odor Date: 03/02/2021 Duration: 22.50 mins Patient history: 87yo M with ams. EEG to evaluate for seizure Level of alertness: Awake AEDs during EEG study: None  Technical aspects: This EEG study was done with scalp electrodes positioned according to the 10-20 International system of electrode placement. Electrical activity was acquired at a sampling rate of '500Hz'$  and reviewed with a high frequency filter of '70Hz'$  and a low frequency filter of '1Hz'$ . EEG data were recorded continuously and digitally stored. Description: EEG showed continuous generalized 3 to 6 Hz theta-delta slowing. Intermittent generalized periodic discharges with triphasic morphology at  '1Hz'$  were also noted. Hyperventilation and photic stimulation were not performed.   ABNORMALITY - Periodic discharges with triphasic morphology, generalized ( GPDs) - Continuous slow, generalized IMPRESSION: This study is suggestive of moderate diffuse encephalopathy, nonspecific etiology but likely related to toxic-metabolic causes. No seizures or definite epileptiform discharges were seen throughout the recording. Priyanka O Yadav   [place] EKG SITE RITE  Result Date: 02/26/2021 If [facility] image not attached, placement could not be confirmed due to current cardiac rhythm.   Orson Eva, DO  [facility]  If 7PM-7AM, please contact night-coverage [email or web address] Password [email or web address] 03/07/2021, 12:51 PM   LOS: 16 days

## 2021-03-07 NOTE — Progress Notes (Signed)
Tele called patient had three more slow ventricular responses 2.05 at 1304, 2.08 at 1334 and 2.09 at 1339. Tele stated HR dropped in the 30s during the pauses and HR returned to 50s and 60s after pause. Made MD Tat aware.

## 2021-03-07 NOTE — Progress Notes (Signed)
Physical Therapy Treatment Patient Details Name: Philip Mcclain MRN: [id number] DOB: 26-Jan-1934 Today's Date: 03/07/2021    History of Present Illness Pt is an 87 yo s/p left knee washout/lavage on 6/22. Pt brought into the emergency room with a swollen left knee and confusion on 6/18 and found to be in severe sepsis from infected left knee joint. Of note, pt seen by orthopedics in the outpatient setting and underwent left knee aspiration for steroid injection on June 14 with fluid findings consistent with gout, but also grew out Pseudomonas. Head CT 27: No acute intracranial finding. PMH: pulmonary hypertension, afib, HTN    PT Comments    Pt HOH, therapist offered him hearing aids though refused to wear this session.  Pt limited by pain and increased time to complete bed mobility and exercises with multimodal cueing required to do cognition and tendency to get off track during discussion.  AAROM complete with BLE in pain free pain.   EOS pt left in bed with call bell within reach and bed alarm set.     Follow Up Recommendations  SNF;Supervision for mobility/OOB;Supervision/Assistance - 24 hour     Equipment Recommendations  None recommended by PT    Recommendations for Other Services       Precautions / Restrictions Precautions Precautions: Fall Restrictions LLE Weight Bearing: Weight bearing as tolerated Other Position/Activity Restrictions: PROM/AROM Left knee    Mobility  Bed Mobility Overal bed mobility: Needs Assistance Bed Mobility: Sit to Supine;Rolling;Supine to Sit Rolling: Min assist   Supine to sit: Mod assist;HOB elevated     General bed mobility comments: requires mod assist to upright trunk and for LE movement to transition to seated EOB    Transfers                    Ambulation/Gait                 Stairs             Wheelchair Mobility    Modified Rankin (Stroke Patients Only)       Balance                                             Cognition Arousal/Alertness: Awake/alert Behavior During Therapy: WFL for tasks assessed/performed Overall Cognitive Status: No family/caregiver present to determine baseline cognitive functioning Area of Impairment: Following commands;Problem solving                       Following Commands: Follows one step commands inconsistently     Problem Solving: Slow processing;Difficulty sequencing;Requires verbal cues;Requires tactile cues General Comments: Pt very HOH, requires increased time with commands, multimodal cues for sequencing      Exercises General Exercises - Lower Extremity Ankle Circles/Pumps: AROM;Both;10 reps;Seated Quad Sets: Both;AAROM;10 reps Short Arc Quad: AAROM;Strengthening;Both;10 reps Heel Slides: AAROM;Strengthening;Both;10 reps Hip ABduction/ADduction: AAROM;Strengthening;Both;10 reps    General Comments        Pertinent Vitals/Pain Faces Pain Scale: Hurts whole lot Pain Location: L knee Pain Descriptors / Indicators: Grimacing;Guarding;Moaning Pain Intervention(s): Limited activity within patient's tolerance;Monitored during session;Repositioned    Home Living                      Prior Function            PT Goals (current  goals can now be found in the care plan section)      Frequency    Min 3X/week      PT Plan Current plan remains appropriate    Co-evaluation              AM-PAC PT "6 Clicks" Mobility   Outcome Measure  Help needed turning from your back to your side while in a flat bed without using bedrails?: A Little Help needed moving from lying on your back to sitting on the side of a flat bed without using bedrails?: A Lot Help needed moving to and from a bed to a chair (including a wheelchair)?: Total Help needed standing up from a chair using your arms (e.g., wheelchair or bedside chair)?: Total Help needed to walk in hospital room?: Total Help needed climbing 3-5  steps with a railing? : Total 6 Click Score: 9    End of Session Equipment Utilized During Treatment: Gait belt Activity Tolerance: Patient limited by pain Patient left: in bed;with call bell/phone within reach;with bed alarm set Nurse Communication: Mobility status PT Visit Diagnosis: Unsteadiness on feet (R26.81);Other abnormalities of gait and mobility (R26.89);Muscle weakness (generalized) (M62.81);Difficulty in walking, not elsewhere classified (R26.2);Pain Pain - Right/Left: Left Pain - part of body: Knee     Time: 0945-1010 PT Time Calculation (min) (ACUTE ONLY): 25 min  Charges:  $Therapeutic Exercise: 8-22 mins $Therapeutic Activity: 8-22 mins                    Philip Mcclain, LPTA/CLT; CBIS [phone]    Philip Mcclain 03/07/2021, 1:39 PM

## 2021-03-08 ENCOUNTER — Inpatient Hospital Stay
Admission: RE | Admit: 2021-03-08 | Discharge: 2021-03-23 | Disposition: A | Discharge status: Hospice | Payer: Medicare Other | Source: Other Acute Inpatient Hospital | Attending: Internal Medicine | Admitting: Internal Medicine

## 2021-03-08 DIAGNOSIS — R319 Hematuria, unspecified: Secondary | ICD-10-CM | POA: Diagnosis not present

## 2021-03-08 DIAGNOSIS — N183 Chronic kidney disease, stage 3 unspecified: Secondary | ICD-10-CM | POA: Diagnosis present

## 2021-03-08 DIAGNOSIS — F0390 Unspecified dementia without behavioral disturbance: Secondary | ICD-10-CM | POA: Diagnosis present

## 2021-03-08 DIAGNOSIS — R2242 Localized swelling, mass and lump, left lower limb: Secondary | ICD-10-CM | POA: Diagnosis not present

## 2021-03-08 DIAGNOSIS — J189 Pneumonia, unspecified organism: Secondary | ICD-10-CM | POA: Diagnosis not present

## 2021-03-08 DIAGNOSIS — G928 Other toxic encephalopathy: Secondary | ICD-10-CM | POA: Diagnosis present

## 2021-03-08 DIAGNOSIS — J90 Pleural effusion, not elsewhere classified: Secondary | ICD-10-CM | POA: Diagnosis not present

## 2021-03-08 DIAGNOSIS — I10 Essential (primary) hypertension: Secondary | ICD-10-CM | POA: Diagnosis not present

## 2021-03-08 DIAGNOSIS — E861 Hypovolemia: Secondary | ICD-10-CM | POA: Diagnosis not present

## 2021-03-08 DIAGNOSIS — Z7401 Bed confinement status: Secondary | ICD-10-CM | POA: Diagnosis not present

## 2021-03-08 DIAGNOSIS — E43 Unspecified severe protein-calorie malnutrition: Secondary | ICD-10-CM | POA: Diagnosis not present

## 2021-03-08 DIAGNOSIS — I272 Pulmonary hypertension, unspecified: Secondary | ICD-10-CM | POA: Diagnosis present

## 2021-03-08 DIAGNOSIS — N138 Other obstructive and reflux uropathy: Secondary | ICD-10-CM | POA: Diagnosis not present

## 2021-03-08 DIAGNOSIS — Z8673 Personal history of transient ischemic attack (TIA), and cerebral infarction without residual deficits: Secondary | ICD-10-CM | POA: Diagnosis not present

## 2021-03-08 DIAGNOSIS — T17908A Unspecified foreign body in respiratory tract, part unspecified causing other injury, initial encounter: Secondary | ICD-10-CM

## 2021-03-08 DIAGNOSIS — N184 Chronic kidney disease, stage 4 (severe): Secondary | ICD-10-CM | POA: Diagnosis not present

## 2021-03-08 DIAGNOSIS — N1832 Chronic kidney disease, stage 3b: Secondary | ICD-10-CM | POA: Diagnosis not present

## 2021-03-08 DIAGNOSIS — Z87891 Personal history of nicotine dependence: Secondary | ICD-10-CM | POA: Diagnosis not present

## 2021-03-08 DIAGNOSIS — I129 Hypertensive chronic kidney disease with stage 1 through stage 4 chronic kidney disease, or unspecified chronic kidney disease: Secondary | ICD-10-CM | POA: Diagnosis present

## 2021-03-08 DIAGNOSIS — R682 Dry mouth, unspecified: Secondary | ICD-10-CM | POA: Diagnosis not present

## 2021-03-08 DIAGNOSIS — I480 Paroxysmal atrial fibrillation: Secondary | ICD-10-CM | POA: Diagnosis present

## 2021-03-08 DIAGNOSIS — M109 Gout, unspecified: Secondary | ICD-10-CM | POA: Diagnosis present

## 2021-03-08 DIAGNOSIS — I517 Cardiomegaly: Secondary | ICD-10-CM | POA: Diagnosis not present

## 2021-03-08 DIAGNOSIS — Z6826 Body mass index (BMI) 26.0-26.9, adult: Secondary | ICD-10-CM | POA: Diagnosis not present

## 2021-03-08 DIAGNOSIS — N189 Chronic kidney disease, unspecified: Secondary | ICD-10-CM | POA: Diagnosis not present

## 2021-03-08 DIAGNOSIS — R609 Edema, unspecified: Secondary | ICD-10-CM | POA: Diagnosis not present

## 2021-03-08 DIAGNOSIS — N179 Acute kidney failure, unspecified: Secondary | ICD-10-CM | POA: Diagnosis present

## 2021-03-08 DIAGNOSIS — E46 Unspecified protein-calorie malnutrition: Secondary | ICD-10-CM | POA: Diagnosis present

## 2021-03-08 DIAGNOSIS — M00862 Arthritis due to other bacteria, left knee: Secondary | ICD-10-CM | POA: Diagnosis present

## 2021-03-08 DIAGNOSIS — G894 Chronic pain syndrome: Secondary | ICD-10-CM | POA: Diagnosis present

## 2021-03-08 DIAGNOSIS — M009 Pyogenic arthritis, unspecified: Secondary | ICD-10-CM | POA: Diagnosis not present

## 2021-03-08 DIAGNOSIS — G902 Horner's syndrome: Secondary | ICD-10-CM | POA: Diagnosis not present

## 2021-03-08 DIAGNOSIS — N401 Enlarged prostate with lower urinary tract symptoms: Secondary | ICD-10-CM | POA: Diagnosis present

## 2021-03-08 DIAGNOSIS — Z20822 Contact with and (suspected) exposure to covid-19: Secondary | ICD-10-CM | POA: Diagnosis not present

## 2021-03-08 DIAGNOSIS — R350 Frequency of micturition: Secondary | ICD-10-CM | POA: Diagnosis not present

## 2021-03-08 DIAGNOSIS — R0600 Dyspnea, unspecified: Secondary | ICD-10-CM | POA: Diagnosis not present

## 2021-03-08 DIAGNOSIS — R404 Transient alteration of awareness: Secondary | ICD-10-CM | POA: Diagnosis not present

## 2021-03-08 DIAGNOSIS — E222 Syndrome of inappropriate secretion of antidiuretic hormone: Secondary | ICD-10-CM | POA: Diagnosis not present

## 2021-03-08 DIAGNOSIS — J811 Chronic pulmonary edema: Secondary | ICD-10-CM | POA: Diagnosis not present

## 2021-03-08 DIAGNOSIS — L89156 Pressure-induced deep tissue damage of sacral region: Secondary | ICD-10-CM | POA: Diagnosis present

## 2021-03-08 DIAGNOSIS — A419 Sepsis, unspecified organism: Secondary | ICD-10-CM | POA: Diagnosis not present

## 2021-03-08 DIAGNOSIS — Z4659 Encounter for fitting and adjustment of other gastrointestinal appliance and device: Secondary | ICD-10-CM

## 2021-03-08 DIAGNOSIS — E871 Hypo-osmolality and hyponatremia: Secondary | ICD-10-CM | POA: Diagnosis present

## 2021-03-08 DIAGNOSIS — I4891 Unspecified atrial fibrillation: Secondary | ICD-10-CM | POA: Diagnosis not present

## 2021-03-08 DIAGNOSIS — A4152 Sepsis due to Pseudomonas: Secondary | ICD-10-CM | POA: Diagnosis not present

## 2021-03-08 DIAGNOSIS — H04129 Dry eye syndrome of unspecified lacrimal gland: Secondary | ICD-10-CM | POA: Diagnosis not present

## 2021-03-08 DIAGNOSIS — Z79899 Other long term (current) drug therapy: Secondary | ICD-10-CM | POA: Diagnosis not present

## 2021-03-08 DIAGNOSIS — F419 Anxiety disorder, unspecified: Secondary | ICD-10-CM | POA: Diagnosis not present

## 2021-03-08 DIAGNOSIS — L89896 Pressure-induced deep tissue damage of other site: Secondary | ICD-10-CM | POA: Diagnosis present

## 2021-03-08 DIAGNOSIS — R52 Pain, unspecified: Secondary | ICD-10-CM | POA: Diagnosis not present

## 2021-03-08 DIAGNOSIS — G9341 Metabolic encephalopathy: Secondary | ICD-10-CM | POA: Diagnosis not present

## 2021-03-08 DIAGNOSIS — E860 Dehydration: Secondary | ICD-10-CM | POA: Diagnosis not present

## 2021-03-08 DIAGNOSIS — M7989 Other specified soft tissue disorders: Secondary | ICD-10-CM | POA: Diagnosis not present

## 2021-03-08 DIAGNOSIS — K921 Melena: Secondary | ICD-10-CM | POA: Diagnosis not present

## 2021-03-08 DIAGNOSIS — I4819 Other persistent atrial fibrillation: Secondary | ICD-10-CM | POA: Diagnosis present

## 2021-03-08 DIAGNOSIS — B965 Pseudomonas (aeruginosa) (mallei) (pseudomallei) as the cause of diseases classified elsewhere: Secondary | ICD-10-CM | POA: Diagnosis present

## 2021-03-08 DIAGNOSIS — Z66 Do not resuscitate: Secondary | ICD-10-CM | POA: Diagnosis present

## 2021-03-08 DIAGNOSIS — E872 Acidosis: Secondary | ICD-10-CM | POA: Diagnosis not present

## 2021-03-08 DIAGNOSIS — R338 Other retention of urine: Secondary | ICD-10-CM | POA: Diagnosis present

## 2021-03-08 DIAGNOSIS — Z7901 Long term (current) use of anticoagulants: Secondary | ICD-10-CM | POA: Diagnosis not present

## 2021-03-08 DIAGNOSIS — K219 Gastro-esophageal reflux disease without esophagitis: Secondary | ICD-10-CM | POA: Diagnosis not present

## 2021-03-08 LAB — OSMOLALITY, URINE: Osmolality, Ur: 447 mOsm/kg (ref 300–900)

## 2021-03-08 LAB — BASIC METABOLIC PANEL
Anion gap: 7 (ref 5–15)
BUN: 36 mg/dL — ABNORMAL HIGH (ref 8–23)
CO2: 17 mmol/L — ABNORMAL LOW (ref 22–32)
Calcium: 9.2 mg/dL (ref 8.9–10.3)
Chloride: 107 mmol/L (ref 98–111)
Creatinine, Ser: 2.11 mg/dL — ABNORMAL HIGH (ref 0.61–1.24)
GFR, Estimated: 30 mL/min — ABNORMAL LOW (ref >60)
Glucose, Bld: 97 mg/dL (ref 70–99)
Potassium: 4 mmol/L (ref 3.5–5.1)
Sodium: 131 mmol/L — ABNORMAL LOW (ref 135–145)

## 2021-03-08 LAB — SODIUM, URINE, RANDOM: Sodium, Ur: 82 mmol/L

## 2021-03-08 LAB — CREATININE, URINE, RANDOM: Creatinine, Urine: 69.62 mg/dL

## 2021-03-08 LAB — MAGNESIUM: Magnesium: 2 mg/dL (ref 1.7–2.4)

## 2021-03-08 MED ORDER — SODIUM BICARBONATE 650 MG PO TABS: 650.0000 mg | ORAL_TABLET | Freq: Every day | ORAL | Status: AC

## 2021-03-08 MED ORDER — TRAMADOL HCL 50 MG PO TABS: 50.0000 mg | ORAL_TABLET | Freq: Four times a day (QID) | ORAL | Status: AC | PRN

## 2021-03-08 MED ORDER — CAMPHOR-MENTHOL 0.5-0.5 % EX LOTN: TOPICAL_LOTION | CUTANEOUS | 0 refills | Status: AC | PRN

## 2021-03-08 MED ORDER — SODIUM BICARBONATE 650 MG PO TABS: 650.0000 mg | ORAL_TABLET | Freq: Every day | ORAL | Status: DC

## 2021-03-08 MED ORDER — SODIUM CHLORIDE 0.9 % IV SOLN: 2.0000 g | INTRAVENOUS | Status: AC

## 2021-03-08 NOTE — TOC Transition Note (Signed)
Transition of Care [facility]) - CM/SW Discharge Note   Patient Details  Name: GEORGIO GO MRN: [id number] Date of Birth: 1934/06/06  Transition of Care [facility]) CM/SW Contact:  Boneta Lucks, RN Phone Number: 03/08/2021, 12:02 PM   Clinical Narrative:   [facility] has [facility] available today.  Dr Merton Border accepting, room 10. RN to call report [phone]. [facility] set up for 5PM. TOC called Sharyn Lull to update with DC plan, she will be here before 5PM to collect his belongings.    Final next level of care: Long Term Acute Care (LTAC) Barriers to Discharge: Barriers Resolved   Patient Goals and CMS Choice Patient states their goals for this hospitalization and ongoing recovery are:: agreeing to [facility] CMS Medicare.gov Compare Post Acute Care list provided to:: Patient Represenative (must comment) Choice offered to / list presented to : Adult Children  Discharge Placement              Patient chooses bed at:  ([facility]) Patient to be transferred to facility by: [facility] Name of family member notified: Sharyn Lull Patient and family notified of of transfer: 03/08/21  Discharge Plan and Services     Post Acute Care Choice: Home Health                               Social Determinants of Health (SDOH) Interventions     Readmission Risk Interventions Readmission Risk Prevention Plan 03/04/2021  Transportation Screening Complete  PCP or Specialist Appt within 5-7 Days Complete  Home Care Screening Complete  Medication Review (RN CM) Complete  Some recent data might be hidden

## 2021-03-08 NOTE — Discharge Summary (Addendum)
Physician Discharge Summary  Philip Mcclain RJJ:[id number] DOB: 26-Jan-1934 DOA: 02/19/2021  PCP: Glenda Chroman, MD  Admit date: 02/19/2021 Discharge date: 03/08/2021  Admitted From: Home Disposition:  [facility]    Discharge Condition: Stable CODE STATUS: DNR Diet recommendation: Heart Healthy / Carb Modified    Brief/Interim Summary: 87 year old with past medical history of pulmonary hypertension, persistent atrial fibrillation on Eliquis [place] and hypertension seen by orthopedics in the outpatient setting and underwent left knee aspiration for steroid injection on June 14 with fluid findings consistent with gout, but also grew out Pseudomonas.  Brought into the emergency room with a swollen left knee and confusion on 6/18 and found to be in severe sepsis from infected left knee joint.  Patient started on IV fluids and antibiotics and admitted to the hospitalist service.  Orthopedics consulted and patient underwent left knee washout on 6/22.  Since then was having some severe knee pain over the next 24 hours and on night of 6/23, developed upper extremity tremors   Initially, knee pain and tremors have both improved, especially with discontinuation of morphine and Oxy IR.  In the last 2 days, patient a bit more confused.  Seems a little more lucid today although having significant knee pain.  Blood pressures have also trended up in the last few days with systolic in the 016W to 109N.  Head CT negative. Overall, the patient's mental status gradually improved despite being pleasantly confused.  Inflammatory markers suggested improving infectious process.  Work up for other reversible causes of delirium was undertaken and was largely unremarkable.  His mental status did gradually improve, but he continues to be pleasantly confused.  Discharge Diagnoses:  Severe pseudomonal sepsis from septic joint of left knee joint [facility]):  -Patient met criteria for severe sepsis given tachycardia,  tachypnea, leukocytosis with acute kidney injury, lactic acidosis, present on admission.   -Sepsis stabilized.  Lactic acid now normalized.  -knee washout 6/22.  Continue cefepime  Previous cultures grew out Pseudomonas and cultures from washout grew out sensitive Pseudomonas added Toradol x1 for knee pain in addition to his other pain medications.  Discussed with infectious disease who recommend 4 weeks of IV antibiotics.  PICC line placed 6/25.  Infectious disease following.  Patient to be on cefepime 2 g twice a day for the next 3 weeks for total of 4 weeks.   -6/28 MR knee--mod size exudative effusion with synovitis.  Periarticular and subchondral bony edema;  extensive soft tissue edema -CRP 19.4>>5.2 -plan cefepime with last day on 03/21/21   Pulmonary HTN ([place]):  -Clinically compensated.   Essential hypertension:  -Blood pressures have been trending upward.    -Had responded with significant diuresis -hold additional lasix with uptrending creatinine and does not appear volume overloaded -Adding as needed hydralazine.   -d/c metoprolol succinate due to bradycardia -d/c ARB due to CKD -BP remains stable   Persistent atrial fibrillation ([place]):  -Rate controlled.  -continue metoprolol succinate -held apixaban temporarily -no further hematochezia==>restart apixaban and monitor   Acute toxic/metabolic Encephalopathy -multifactorial including infectious process, hyponatremia, increasing serum creatinine, and anticholinergic side effects -MRI brain neg for acute findings -B12--1453 -folate 14.3 -ammonia 13 -ABG--no hypoxia nor hypercarbia -UA--neg for pyuria -appreciate neurology input -restart IVF>>saline locked 7/1 -overall slowly improving each day, but remains pleasantly confused   Heme Positive stool -unclear if RN actually saw any hematochezia or melena -GI consult -held apixaban temporarily>>restarted and Hgb remained stable -no further hematochezia/melena noted   AKI  on chronic stage IIIb chronic kidney disease:  -Patient on admission with creatinine of 2.16, due to sepsis.   -baseline creatinine 1.4-1.5 -discontinue lasix IV -restarted gentle IVF-->now stable off IVF -patient likely has new renal baseline-->1.9-2.1 -D/C ARB   Tremors -Possibly from morphine so this was discontinued.  Resolved   BPH -continue flomax -urinating fine   hyponatremia:  -Secondary to hypovolemia and poor solute inake and a degree of SIADH (due to acute medical illness) -urine osm 447 -serum osm 292 -FeNa 1.90% -Na 131 on day of d/c  Urinary retention -foley placed 03/08/21 -voiding trial in 48 hours     Discharge Instructions   Allergies as of 03/08/2021   No Known Allergies      Medication List     STOP taking these medications    furosemide 40 MG tablet Commonly known as: LASIX   HYDROcodone-acetaminophen 5-325 MG tablet Commonly known as: NORCO/VICODIN   metoprolol succinate 50 MG 24 hr tablet Commonly known as: TOPROL-XL   potassium chloride 10 MEQ tablet Commonly known as: KLOR-CON   valsartan 160 MG tablet Commonly known as: DIOVAN       TAKE these medications    allopurinol 100 MG tablet Commonly known as: ZYLOPRIM Take 1 tablet (100 mg total) by mouth daily.   atorvastatin 40 MG tablet Commonly known as: LIPITOR Take 1 tablet (40 mg total) by mouth daily.   camphor-menthol lotion Commonly known as: SARNA Apply topically as needed for itching.   ceFEPIme 2 g in sodium chloride 0.9 % 100 mL Inject 2 g into the vein daily. Last dose on 03/21/21 Start taking on: March 09, 2021   Eliquis 2.5 MG Tabs tablet Generic drug: apixaban Take 2.5 mg by mouth 2 (two) times daily.   finasteride 5 MG tablet Commonly known as: Proscar Take 1 tablet (5 mg total) by mouth daily.   MULTIVITAMIN ADULTS 50+ PO Take 1 tablet by mouth every evening.   pantoprazole 40 MG tablet Commonly known as: PROTONIX Take 40 mg by mouth every  evening.   tamsulosin 0.4 MG Caps capsule Commonly known as: FLOMAX Take 0.4 mg by mouth 2 (two) times daily.   traMADol 50 MG tablet Commonly known as: ULTRAM Take 1 tablet (50 mg total) by mouth every 6 (six) hours as needed for severe pain.        Follow-up Information     [place], [facility] Follow up.   Why: Transfer when a bed is available Contact information: [address] [place] [address] [phone]                No Known Allergies  Consultations: Jen Mow   Procedures/Studies: DG Pelvis 1-2 Views  Result Date: 02/19/2021 CLINICAL DATA:  Fall today EXAM: PELVIS - 1-2 VIEW COMPARISON:  None. FINDINGS: There is no evidence of pelvic fracture or diastasis. No pelvic bone lesions are seen. Atherosclerotic calcification. IMPRESSION: Negative. Electronically Signed   By: Franchot Gallo M.D.   On: 02/19/2021 15:50   DG Knee 1-2 Views Left  Result Date: 02/19/2021 CLINICAL DATA:  Left lower leg pain and swelling after fall EXAM: LEFT TIBIA AND FIBULA - 2 VIEW; LEFT KNEE - 1-2 VIEW COMPARISON:  None FINDINGS: Subtle cortical irregularity involving the peripheral aspect of the lateral tibial plateau articular surface suspicious for tibial plateau fracture. There is a large knee joint effusion. Severe tricompartmental osteoarthritis, most pronounced laterally. Ossification within the region of the distal patellar tendon. Remainder  of the tibia and fibula are intact. Ankle mortise appears congruent. Advanced degenerative changes within the midfoot. Diffuse soft tissue edema, nonspecific. Extensive severe vascular calcifications. IMPRESSION: 1. Findings suspicious for a non-depressed lateral tibial plateau fracture. 2. Large knee joint effusion, likely hemarthrosis. 3. Severe tricompartmental osteoarthritis of the left knee. Electronically Signed   By: Davina Poke D.O.   On: 02/19/2021 16:43   DG Tibia/Fibula  Left  Result Date: 02/19/2021 CLINICAL DATA:  Left lower leg pain and swelling after fall EXAM: LEFT TIBIA AND FIBULA - 2 VIEW; LEFT KNEE - 1-2 VIEW COMPARISON:  None FINDINGS: Subtle cortical irregularity involving the peripheral aspect of the lateral tibial plateau articular surface suspicious for tibial plateau fracture. There is a large knee joint effusion. Severe tricompartmental osteoarthritis, most pronounced laterally. Ossification within the region of the distal patellar tendon. Remainder of the tibia and fibula are intact. Ankle mortise appears congruent. Advanced degenerative changes within the midfoot. Diffuse soft tissue edema, nonspecific. Extensive severe vascular calcifications. IMPRESSION: 1. Findings suspicious for a non-depressed lateral tibial plateau fracture. 2. Large knee joint effusion, likely hemarthrosis. 3. Severe tricompartmental osteoarthritis of the left knee. Electronically Signed   By: Davina Poke D.O.   On: 02/19/2021 16:43   CT HEAD WO CONTRAST  Result Date: 02/28/2021 CLINICAL DATA:  Mental status changes of unknown cause. Acute delirium. EXAM: CT HEAD WITHOUT CONTRAST TECHNIQUE: Contiguous axial images were obtained from the base of the skull through the vertex without intravenous contrast. COMPARISON:  02/19/2021 FINDINGS: Brain: Age related volume loss. Chronic small-vessel ischemic changes of the cerebral hemispheric white matter. No sign of acute infarction, mass lesion, hemorrhage, hydrocephalus or extra-axial collection. Vascular: There is atherosclerotic calcification of the major vessels at the base of the brain. Skull: Negative Sinuses/Orbits: Chronic right maxillary sinusitis.  Orbits negative. Other: None IMPRESSION: No acute intracranial finding. Atrophy and chronic small-vessel ischemic changes of the white matter Chronic right maxillary sinusitis. Electronically Signed   By: Nelson Chimes M.D.   On: 02/28/2021 19:44   CT Head Wo Contrast  Result Date:  02/19/2021 CLINICAL DATA:  Head trauma, loss of consciousness.  Altered, falls. EXAM: CT HEAD WITHOUT CONTRAST TECHNIQUE: Contiguous axial images were obtained from the base of the skull through the vertex without intravenous contrast. COMPARISON:  Brain MRI 08/19/2020.  Head CT 08/19/2020. FINDINGS: Brain: Mild generalized cerebral atrophy. Moderate patchy and ill-defined hypoattenuation within the cerebral white matter, nonspecific but compatible with chronic small vessel ischemic disease. There is no acute intracranial hemorrhage. No demarcated cortical infarct. No extra-axial fluid collection. No evidence of intracranial mass. No midline shift. Vascular: No hyperdense vessel.  Atherosclerotic calcifications. Skull: Normal. Negative for fracture or focal lesion. Sinuses/Orbits: Visualized orbits show no acute finding. Fairly extensive partial opacification of the right maxillary sinus. Trace left maxillary sinus mucosal thickening. IMPRESSION: No evidence of acute intracranial abnormality. Moderate cerebral white matter chronic small vessel ischemic disease. Mild generalized parenchymal atrophy. Chronic right maxillary sinusitis. Electronically Signed   By: Kellie Simmering DO   On: 02/19/2021 14:12   MR BRAIN WO CONTRAST  Result Date: 03/02/2021 CLINICAL DATA:  Altered mental status EXAM: MRI HEAD WITHOUT CONTRAST TECHNIQUE: Multiplanar, multiecho pulse sequences of the brain and surrounding structures were obtained without intravenous contrast. COMPARISON:  December 2021 FINDINGS: Motion artifact is present. Brain: There is no acute infarction or intracranial hemorrhage. There is no intracranial mass, mass effect, or edema. There is no hydrocephalus or extra-axial fluid collection. Ventricles and sulci are stable  in size and configuration. Patchy and confluent areas of T2 hyperintensity in the supratentorial white matter likely reflects stable chronic microvascular ischemic changes. Focus of susceptibility is  again identified in the right cerebellum along the fourth ventricle most compatible with chronic microhemorrhage or occult cavernous malformation. Vascular: Major vessel flow voids at the skull base are preserved. Skull and upper cervical spine: Normal marrow signal is preserved. Sinuses/Orbits: Chronic right maxillary sinusitis. Bilateral lens replacements. Other: Sella is unremarkable.  Mastoid air cells are clear. IMPRESSION: Motion degradation. No evidence of recent infarction, hemorrhage, or mass. Stable chronic findings detailed above. Electronically Signed   By: Macy Mis M.D.   On: 03/02/2021 09:24   MR KNEE LEFT WO CONTRAST  Result Date: 03/01/2021 CLINICAL DATA:  Knee replacement, known infection s/p knee washout, increased knee pain, worsening confusion and white blood cell ct, EXAM: MRI OF THE LEFT KNEE WITHOUT CONTRAST TECHNIQUE: Multiplanar, multisequence MR imaging of the knee was performed. No intravenous contrast was administered. COMPARISON:  Left knee radiograph 02/19/2021 FINDINGS: Severe image degradation due to motion artifact. MENISCI Medial: Macerated appearance of the anterior horn and body of the medial meniscus with multidirectional tearing in the posterior horn. Lateral: Macerated anterior horn of the lateral meniscus with likely torn meniscal root and mild extrusion. LIGAMENTS Cruciates: The ACL is not well visualized and possibly chronically torn. Intact PCL. Collaterals: Medial collateral ligament is intact. Lateral collateral ligament complex is intact. CARTILAGE Patellofemoral:  Moderate-severe chondrosis. Medial:  Moderate-severe chondrosis Lateral: Severe chondrosis with full-thickness cartilage loss along the weight-bearing surfaces. JOINT: Moderate-sized exudative appearing joint effusion with synovitis. POPLITEAL FOSSA: Moderate size Baker cyst. EXTENSOR MECHANISM: Intact quadriceps tendon. Intact patellar tendon. Intact lateral patellar retinaculum. Intact medial  patellar retinaculum. Intact MPFL. BONES: Periarticular, subchondral bony edema favored to be related to severe degenerative arthritis. Other: Extensive soft tissue swelling along the knee including edema signal within the visualized musculature. IMPRESSION: Moderate-sized exudative appearing joint effusion with synovitis. Periarticular, subchondral bony edema, favored to be due to severe degenerative arthritis, though early changes of osteomyelitis is possible. Extensive soft tissue swelling along the knee including within the visualized musculature consistent with myositis. Severe tricompartment osteoarthritis, worst in the lateral compartment with essentially full-thickness cartilage loss along the weight-bearing surfaces. Macerated appearance of the anterior horn and body of the medial meniscus with multidirectional degenerative tearing in the posterior horn. Macerated anterior horn of the lateral meniscus with torn meniscal root and mild extrusion. Poorly visualized ACL, possibly chronically torn, correlate for instability. Moderate size Baker cyst. Electronically Signed   By: Maurine Simmering   On: 03/01/2021 19:54   DG CHEST PORT 1 VIEW  Result Date: 02/26/2021 CLINICAL DATA:  Evaluate PICC line placement EXAM: PORTABLE CHEST 1 VIEW COMPARISON:  February 19, 2021 FINDINGS: A new right PICC line terminates in the central SVC. No pneumothorax. Stable cardiomegaly. No other changes. IMPRESSION: The new right PICC line terminates in the central SVC. No pneumothorax. No other changes. Electronically Signed   By: Dorise Bullion III M.D   On: 02/26/2021 15:22   DG Chest Port 1 View  Result Date: 02/19/2021 CLINICAL DATA:  Question sepsis EXAM: PORTABLE CHEST 1 VIEW COMPARISON:  12/05/2009. FINDINGS: The heart size and mediastinal contours are within normal limits. Atherosclerotic calcification aortic arch. Both lungs are clear. Degenerative change in both shoulders. IMPRESSION: No active disease. Electronically  Signed   By: Franchot Gallo M.D.   On: 02/19/2021 14:25   EEG adult  Result Date: 03/02/2021 Hortense Ramal,  Cecil Cranker, MD     03/02/2021  5:11 PM Patient Name: KALIEF KATTNER MRN: [id number] Epilepsy Attending: Lora Havens Referring Physician/Provider: Dr Phillips Odor Date: 03/02/2021 Duration: 22.50 mins Patient history: 87yo M with ams. EEG to evaluate for seizure Level of alertness: Awake AEDs during EEG study: None Technical aspects: This EEG study was done with scalp electrodes positioned according to the 10-20 International system of electrode placement. Electrical activity was acquired at a sampling rate of _0  and reviewed with a high frequency filter of _1  and a low frequency filter of _2 . EEG data were recorded continuously and digitally stored. Description: EEG showed continuous generalized 3 to 6 Hz theta-delta slowing. Intermittent generalized periodic discharges with triphasic morphology at  _3  were also noted. Hyperventilation and photic stimulation were not performed.   ABNORMALITY - Periodic discharges with triphasic morphology, generalized ( GPDs) - Continuous slow, generalized IMPRESSION: This study is suggestive of moderate diffuse encephalopathy, nonspecific etiology but likely related to toxic-metabolic causes. No seizures or definite epileptiform discharges were seen throughout the recording. Priyanka O Yadav   [place] EKG SITE RITE  Result Date: 02/26/2021 If [facility] image not attached, placement could not be confirmed due to current cardiac rhythm.       Discharge Exam: Vitals:   03/07/21 2040 03/08/21 0358  BP: 138/85 (!) 146/63  Pulse: 65 61  Resp:    Temp: 98.5 F (36.9 C) 97.7 F (36.5 C)  SpO2: 93% 92%   Vitals:   03/07/21 0819 03/07/21 1340 03/07/21 2040 03/08/21 0358  BP: (!) 145/63 126/73 138/85 (!) 146/63  Pulse: 65 (!) 46 65 61  Resp:  16    Temp:  97.7 F (36.5 C) 98.5 F (36.9 C) 97.7 F (36.5 C)  TempSrc:  Oral  Oral  SpO2:  92% 93% 92%  Weight:       Height:        General: Pt is alert, awake, not in acute distress Cardiovascular: IRRR, S1/S2 +, no rubs, no gallops Respiratory: bibasilar rales. No wheeze Abdominal: Soft, NT, ND, bowel sounds + Extremities: 1+ LE edema, no cyanosis   The results of significant diagnostics from this hospitalization (including imaging, microbiology, ancillary and laboratory) are listed below for reference.    Significant Diagnostic Studies: DG Pelvis 1-2 Views  Result Date: 02/19/2021 CLINICAL DATA:  Fall today EXAM: PELVIS - 1-2 VIEW COMPARISON:  None. FINDINGS: There is no evidence of pelvic fracture or diastasis. No pelvic bone lesions are seen. Atherosclerotic calcification. IMPRESSION: Negative. Electronically Signed   By: Franchot Gallo M.D.   On: 02/19/2021 15:50   DG Knee 1-2 Views Left  Result Date: 02/19/2021 CLINICAL DATA:  Left lower leg pain and swelling after fall EXAM: LEFT TIBIA AND FIBULA - 2 VIEW; LEFT KNEE - 1-2 VIEW COMPARISON:  None FINDINGS: Subtle cortical irregularity involving the peripheral aspect of the lateral tibial plateau articular surface suspicious for tibial plateau fracture. There is a large knee joint effusion. Severe tricompartmental osteoarthritis, most pronounced laterally. Ossification within the region of the distal patellar tendon. Remainder of the tibia and fibula are intact. Ankle mortise appears congruent. Advanced degenerative changes within the midfoot. Diffuse soft tissue edema, nonspecific. Extensive severe vascular calcifications. IMPRESSION: 1. Findings suspicious for a non-depressed lateral tibial plateau fracture. 2. Large knee joint effusion, likely hemarthrosis. 3. Severe tricompartmental osteoarthritis of the left knee. Electronically Signed   By: Davina Poke D.O.   On: 02/19/2021 16:43   DG Tibia/Fibula Left  Result Date: 02/19/2021 CLINICAL DATA:  Left lower leg pain and swelling after fall EXAM: LEFT TIBIA AND FIBULA - 2 VIEW; LEFT KNEE - 1-2  VIEW COMPARISON:  None FINDINGS: Subtle cortical irregularity involving the peripheral aspect of the lateral tibial plateau articular surface suspicious for tibial plateau fracture. There is a large knee joint effusion. Severe tricompartmental osteoarthritis, most pronounced laterally. Ossification within the region of the distal patellar tendon. Remainder of the tibia and fibula are intact. Ankle mortise appears congruent. Advanced degenerative changes within the midfoot. Diffuse soft tissue edema, nonspecific. Extensive severe vascular calcifications. IMPRESSION: 1. Findings suspicious for a non-depressed lateral tibial plateau fracture. 2. Large knee joint effusion, likely hemarthrosis. 3. Severe tricompartmental osteoarthritis of the left knee. Electronically Signed   By: Davina Poke D.O.   On: 02/19/2021 16:43   CT HEAD WO CONTRAST  Result Date: 02/28/2021 CLINICAL DATA:  Mental status changes of unknown cause. Acute delirium. EXAM: CT HEAD WITHOUT CONTRAST TECHNIQUE: Contiguous axial images were obtained from the base of the skull through the vertex without intravenous contrast. COMPARISON:  02/19/2021 FINDINGS: Brain: Age related volume loss. Chronic small-vessel ischemic changes of the cerebral hemispheric white matter. No sign of acute infarction, mass lesion, hemorrhage, hydrocephalus or extra-axial collection. Vascular: There is atherosclerotic calcification of the major vessels at the base of the brain. Skull: Negative Sinuses/Orbits: Chronic right maxillary sinusitis.  Orbits negative. Other: None IMPRESSION: No acute intracranial finding. Atrophy and chronic small-vessel ischemic changes of the white matter Chronic right maxillary sinusitis. Electronically Signed   By: Nelson Chimes M.D.   On: 02/28/2021 19:44   CT Head Wo Contrast  Result Date: 02/19/2021 CLINICAL DATA:  Head trauma, loss of consciousness.  Altered, falls. EXAM: CT HEAD WITHOUT CONTRAST TECHNIQUE: Contiguous axial images  were obtained from the base of the skull through the vertex without intravenous contrast. COMPARISON:  Brain MRI 08/19/2020.  Head CT 08/19/2020. FINDINGS: Brain: Mild generalized cerebral atrophy. Moderate patchy and ill-defined hypoattenuation within the cerebral white matter, nonspecific but compatible with chronic small vessel ischemic disease. There is no acute intracranial hemorrhage. No demarcated cortical infarct. No extra-axial fluid collection. No evidence of intracranial mass. No midline shift. Vascular: No hyperdense vessel.  Atherosclerotic calcifications. Skull: Normal. Negative for fracture or focal lesion. Sinuses/Orbits: Visualized orbits show no acute finding. Fairly extensive partial opacification of the right maxillary sinus. Trace left maxillary sinus mucosal thickening. IMPRESSION: No evidence of acute intracranial abnormality. Moderate cerebral white matter chronic small vessel ischemic disease. Mild generalized parenchymal atrophy. Chronic right maxillary sinusitis. Electronically Signed   By: Kellie Simmering DO   On: 02/19/2021 14:12   MR BRAIN WO CONTRAST  Result Date: 03/02/2021 CLINICAL DATA:  Altered mental status EXAM: MRI HEAD WITHOUT CONTRAST TECHNIQUE: Multiplanar, multiecho pulse sequences of the brain and surrounding structures were obtained without intravenous contrast. COMPARISON:  December 2021 FINDINGS: Motion artifact is present. Brain: There is no acute infarction or intracranial hemorrhage. There is no intracranial mass, mass effect, or edema. There is no hydrocephalus or extra-axial fluid collection. Ventricles and sulci are stable in size and configuration. Patchy and confluent areas of T2 hyperintensity in the supratentorial white matter likely reflects stable chronic microvascular ischemic changes. Focus of susceptibility is again identified in the right cerebellum along the fourth ventricle most compatible with chronic microhemorrhage or occult cavernous malformation.  Vascular: Major vessel flow voids at the skull base are preserved. Skull and upper cervical spine: Normal marrow signal is preserved. Sinuses/Orbits: Chronic right maxillary  sinusitis. Bilateral lens replacements. Other: Sella is unremarkable.  Mastoid air cells are clear. IMPRESSION: Motion degradation. No evidence of recent infarction, hemorrhage, or mass. Stable chronic findings detailed above. Electronically Signed   By: Macy Mis M.D.   On: 03/02/2021 09:24   MR KNEE LEFT WO CONTRAST  Result Date: 03/01/2021 CLINICAL DATA:  Knee replacement, known infection s/p knee washout, increased knee pain, worsening confusion and white blood cell ct, EXAM: MRI OF THE LEFT KNEE WITHOUT CONTRAST TECHNIQUE: Multiplanar, multisequence MR imaging of the knee was performed. No intravenous contrast was administered. COMPARISON:  Left knee radiograph 02/19/2021 FINDINGS: Severe image degradation due to motion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

## 2021-03-09 DIAGNOSIS — F0390 Unspecified dementia without behavioral disturbance: Secondary | ICD-10-CM | POA: Diagnosis not present

## 2021-03-09 DIAGNOSIS — N189 Chronic kidney disease, unspecified: Secondary | ICD-10-CM | POA: Diagnosis not present

## 2021-03-09 DIAGNOSIS — I129 Hypertensive chronic kidney disease with stage 1 through stage 4 chronic kidney disease, or unspecified chronic kidney disease: Secondary | ICD-10-CM | POA: Diagnosis not present

## 2021-03-09 DIAGNOSIS — M00862 Arthritis due to other bacteria, left knee: Secondary | ICD-10-CM | POA: Diagnosis not present

## 2021-03-09 LAB — CBC
HCT: 26 % — ABNORMAL LOW (ref 39.0–52.0)
Hemoglobin: 8.8 g/dL — ABNORMAL LOW (ref 13.0–17.0)
MCH: 31.4 pg (ref 26.0–34.0)
MCHC: 33.8 g/dL (ref 30.0–36.0)
MCV: 92.9 fL (ref 80.0–100.0)
Platelets: 200 10*3/uL (ref 150–400)
RBC: 2.8 MIL/uL — ABNORMAL LOW (ref 4.22–5.81)
RDW: 16.5 % — ABNORMAL HIGH (ref 11.5–15.5)
WBC: 8.9 10*3/uL (ref 4.0–10.5)
nRBC: 0 % (ref 0.0–0.2)

## 2021-03-09 LAB — BASIC METABOLIC PANEL
Anion gap: 8 (ref 5–15)
BUN: 31 mg/dL — ABNORMAL HIGH (ref 8–23)
CO2: 16 mmol/L — ABNORMAL LOW (ref 22–32)
Calcium: 9.7 mg/dL (ref 8.9–10.3)
Chloride: 108 mmol/L (ref 98–111)
Creatinine, Ser: 2.09 mg/dL — ABNORMAL HIGH (ref 0.61–1.24)
GFR, Estimated: 31 mL/min — ABNORMAL LOW (ref >60)
Glucose, Bld: 87 mg/dL (ref 70–99)
Potassium: 3.9 mmol/L (ref 3.5–5.1)
Sodium: 132 mmol/L — ABNORMAL LOW (ref 135–145)

## 2021-03-11 LAB — BASIC METABOLIC PANEL
Anion gap: 6 (ref 5–15)
BUN: 25 mg/dL — ABNORMAL HIGH (ref 8–23)
CO2: 19 mmol/L — ABNORMAL LOW (ref 22–32)
Calcium: 9.5 mg/dL (ref 8.9–10.3)
Chloride: 106 mmol/L (ref 98–111)
Creatinine, Ser: 1.81 mg/dL — ABNORMAL HIGH (ref 0.61–1.24)
GFR, Estimated: 36 mL/min — ABNORMAL LOW (ref >60)
Glucose, Bld: 101 mg/dL — ABNORMAL HIGH (ref 70–99)
Potassium: 3.9 mmol/L (ref 3.5–5.1)
Sodium: 131 mmol/L — ABNORMAL LOW (ref 135–145)

## 2021-03-11 LAB — CBC
HCT: 23.8 % — ABNORMAL LOW (ref 39.0–52.0)
Hemoglobin: 8 g/dL — ABNORMAL LOW (ref 13.0–17.0)
MCH: 31.4 pg (ref 26.0–34.0)
MCHC: 33.6 g/dL (ref 30.0–36.0)
MCV: 93.3 fL (ref 80.0–100.0)
Platelets: 174 10*3/uL (ref 150–400)
RBC: 2.55 MIL/uL — ABNORMAL LOW (ref 4.22–5.81)
RDW: 16.9 % — ABNORMAL HIGH (ref 11.5–15.5)
WBC: 8.3 10*3/uL (ref 4.0–10.5)
nRBC: 0 % (ref 0.0–0.2)

## 2021-03-12 LAB — MAGNESIUM: Magnesium: 1.8 mg/dL (ref 1.7–2.4)

## 2021-03-12 LAB — SODIUM: Sodium: 130 mmol/L — ABNORMAL LOW (ref 135–145)
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
## 2021-03-14 DIAGNOSIS — N179 Acute kidney failure, unspecified: Secondary | ICD-10-CM | POA: Diagnosis not present

## 2021-03-14 DIAGNOSIS — I272 Pulmonary hypertension, unspecified: Secondary | ICD-10-CM | POA: Diagnosis not present

## 2021-03-14 DIAGNOSIS — A4152 Sepsis due to Pseudomonas: Secondary | ICD-10-CM | POA: Diagnosis not present

## 2021-03-14 DIAGNOSIS — G9341 Metabolic encephalopathy: Secondary | ICD-10-CM | POA: Diagnosis not present

## 2021-03-14 LAB — RENAL FUNCTION PANEL
Albumin: 1.9 g/dL — ABNORMAL LOW (ref 3.5–5.0)
Anion gap: 10 (ref 5–15)
BUN: 22 mg/dL (ref 8–23)
CO2: 20 mmol/L — ABNORMAL LOW (ref 22–32)
Calcium: 9.6 mg/dL (ref 8.9–10.3)
Chloride: 101 mmol/L (ref 98–111)
Creatinine, Ser: 1.73 mg/dL — ABNORMAL HIGH (ref 0.61–1.24)
GFR, Estimated: 38 mL/min — ABNORMAL LOW (ref >60)
Glucose, Bld: 91 mg/dL (ref 70–99)
Phosphorus: 2.7 mg/dL (ref 2.5–4.6)
Potassium: 3.5 mmol/L (ref 3.5–5.1)
Sodium: 131 mmol/L — ABNORMAL LOW (ref 135–145)

## 2021-03-14 LAB — CBC
HCT: 25 % — ABNORMAL LOW (ref 39.0–52.0)
Hemoglobin: 8.2 g/dL — ABNORMAL LOW (ref 13.0–17.0)
MCH: 30.5 pg (ref 26.0–34.0)
MCHC: 32.8 g/dL (ref 30.0–36.0)
MCV: 92.9 fL (ref 80.0–100.0)
Platelets: 184 10*3/uL (ref 150–400)
RBC: 2.69 MIL/uL — ABNORMAL LOW (ref 4.22–5.81)
RDW: 17.2 % — ABNORMAL HIGH (ref 11.5–15.5)
WBC: 6.7 10*3/uL (ref 4.0–10.5)
nRBC: 0 % (ref 0.0–0.2)

## 2021-03-14 LAB — MAGNESIUM: Magnesium: 1.9 mg/dL (ref 1.7–2.4)

## 2021-03-15 DIAGNOSIS — I272 Pulmonary hypertension, unspecified: Secondary | ICD-10-CM | POA: Diagnosis not present

## 2021-03-15 DIAGNOSIS — A4152 Sepsis due to Pseudomonas: Secondary | ICD-10-CM | POA: Diagnosis not present

## 2021-03-15 DIAGNOSIS — N179 Acute kidney failure, unspecified: Secondary | ICD-10-CM | POA: Diagnosis not present

## 2021-03-15 DIAGNOSIS — G9341 Metabolic encephalopathy: Secondary | ICD-10-CM | POA: Diagnosis not present

## 2021-03-15 LAB — URINALYSIS, ROUTINE W REFLEX MICROSCOPIC
Bilirubin Urine: NEGATIVE
Glucose, UA: NEGATIVE mg/dL
Ketones, ur: NEGATIVE mg/dL
Nitrite: NEGATIVE
Protein, ur: 30 mg/dL — AB
Specific Gravity, Urine: 1.01 (ref 1.005–1.030)
pH: 6 (ref 5.0–8.0)

## 2021-03-15 LAB — URINALYSIS, MICROSCOPIC (REFLEX): RBC / HPF: >50 RBC/hpf (ref 0–5)

## 2021-03-16 ENCOUNTER — Ambulatory Visit: Payer: Medicare Other | Admitting: Internal Medicine

## 2021-03-16 ENCOUNTER — Other Ambulatory Visit (HOSPITAL_COMMUNITY): Payer: Medicare Other

## 2021-03-16 DIAGNOSIS — A4152 Sepsis due to Pseudomonas: Secondary | ICD-10-CM | POA: Diagnosis not present

## 2021-03-16 DIAGNOSIS — I272 Pulmonary hypertension, unspecified: Secondary | ICD-10-CM | POA: Diagnosis not present

## 2021-03-16 DIAGNOSIS — N179 Acute kidney failure, unspecified: Secondary | ICD-10-CM | POA: Diagnosis not present

## 2021-03-16 DIAGNOSIS — N183 Chronic kidney disease, stage 3 unspecified: Secondary | ICD-10-CM | POA: Diagnosis not present

## 2021-03-16 LAB — RENAL FUNCTION PANEL
Albumin: 1.9 g/dL — ABNORMAL LOW (ref 3.5–5.0)
Anion gap: 9 (ref 5–15)
BUN: 27 mg/dL — ABNORMAL HIGH (ref 8–23)
CO2: 20 mmol/L — ABNORMAL LOW (ref 22–32)
Calcium: 9 mg/dL (ref 8.9–10.3)
Chloride: 102 mmol/L (ref 98–111)
Creatinine, Ser: 1.91 mg/dL — ABNORMAL HIGH (ref 0.61–1.24)
GFR, Estimated: 34 mL/min — ABNORMAL LOW (ref >60)
Glucose, Bld: 89 mg/dL (ref 70–99)
Phosphorus: 2.4 mg/dL — ABNORMAL LOW (ref 2.5–4.6)
Potassium: 3.2 mmol/L — ABNORMAL LOW (ref 3.5–5.1)
Sodium: 131 mmol/L — ABNORMAL LOW (ref 135–145)

## 2021-03-16 LAB — CBC
HCT: 22.5 % — ABNORMAL LOW (ref 39.0–52.0)
Hemoglobin: 7.4 g/dL — ABNORMAL LOW (ref 13.0–17.0)
MCH: 30.7 pg (ref 26.0–34.0)
MCHC: 32.9 g/dL (ref 30.0–36.0)
MCV: 93.4 fL (ref 80.0–100.0)
Platelets: 153 10*3/uL (ref 150–400)
RBC: 2.41 MIL/uL — ABNORMAL LOW (ref 4.22–5.81)
RDW: 17.2 % — ABNORMAL HIGH (ref 11.5–15.5)
WBC: 6.6 10*3/uL (ref 4.0–10.5)
nRBC: 0 % (ref 0.0–0.2)

## 2021-03-16 LAB — MAGNESIUM: Magnesium: 1.8 mg/dL (ref 1.7–2.4)

## 2021-03-17 ENCOUNTER — Other Ambulatory Visit (HOSPITAL_COMMUNITY): Payer: Medicare Other

## 2021-03-17 LAB — URINE CULTURE

## 2021-03-17 LAB — URINALYSIS, ROUTINE W REFLEX MICROSCOPIC
Bilirubin Urine: NEGATIVE
Glucose, UA: NEGATIVE mg/dL
Ketones, ur: NEGATIVE mg/dL
Leukocytes,Ua: NEGATIVE
Nitrite: NEGATIVE
Protein, ur: 100 mg/dL — AB
RBC / HPF: >50 RBC/hpf — ABNORMAL HIGH (ref 0–5)
Specific Gravity, Urine: 1.019 (ref 1.005–1.030)
pH: 6 (ref 5.0–8.0)

## 2021-03-17 LAB — POTASSIUM: Potassium: 3.4 mmol/L — ABNORMAL LOW (ref 3.5–5.1)

## 2021-03-18 ENCOUNTER — Encounter (HOSPITAL_BASED_OUTPATIENT_CLINIC_OR_DEPARTMENT_OTHER): Payer: Medicare Other

## 2021-03-18 DIAGNOSIS — R609 Edema, unspecified: Secondary | ICD-10-CM | POA: Diagnosis not present

## 2021-03-18 DIAGNOSIS — M7989 Other specified soft tissue disorders: Secondary | ICD-10-CM

## 2021-03-18 LAB — CBC
HCT: 22.6 % — ABNORMAL LOW (ref 39.0–52.0)
Hemoglobin: 7.3 g/dL — ABNORMAL LOW (ref 13.0–17.0)
MCH: 30.5 pg (ref 26.0–34.0)
MCHC: 32.3 g/dL (ref 30.0–36.0)
MCV: 94.6 fL (ref 80.0–100.0)
Platelets: 142 10*3/uL — ABNORMAL LOW (ref 150–400)
RBC: 2.39 MIL/uL — ABNORMAL LOW (ref 4.22–5.81)
RDW: 17.6 % — ABNORMAL HIGH (ref 11.5–15.5)
WBC: 7.8 10*3/uL (ref 4.0–10.5)
nRBC: 0 % (ref 0.0–0.2)

## 2021-03-18 LAB — BASIC METABOLIC PANEL
Anion gap: 9 (ref 5–15)
BUN: 37 mg/dL — ABNORMAL HIGH (ref 8–23)
CO2: 19 mmol/L — ABNORMAL LOW (ref 22–32)
Calcium: 9.4 mg/dL (ref 8.9–10.3)
Chloride: 106 mmol/L (ref 98–111)
Creatinine, Ser: 2.06 mg/dL — ABNORMAL HIGH (ref 0.61–1.24)
GFR, Estimated: 31 mL/min — ABNORMAL LOW (ref >60)
Glucose, Bld: 106 mg/dL — ABNORMAL HIGH (ref 70–99)
Potassium: 3.7 mmol/L (ref 3.5–5.1)
Sodium: 134 mmol/L — ABNORMAL LOW (ref 135–145)

## 2021-03-18 LAB — MAGNESIUM: Magnesium: 1.9 mg/dL (ref 1.7–2.4)

## 2021-03-18 NOTE — Progress Notes (Signed)
Lower extremity venous has been completed.   Preliminary results in CV Proc.   Abram Sander 03/18/2021 1:50 PM

## 2021-03-19 ENCOUNTER — Other Ambulatory Visit (HOSPITAL_COMMUNITY): Payer: Medicare Other

## 2021-03-19 LAB — URINE CULTURE: Culture: NO GROWTH

## 2021-03-21 LAB — CBC
HCT: 25.7 % — ABNORMAL LOW (ref 39.0–52.0)
Hemoglobin: 8.2 g/dL — ABNORMAL LOW (ref 13.0–17.0)
MCH: 31.4 pg (ref 26.0–34.0)
MCHC: 31.9 g/dL (ref 30.0–36.0)
MCV: 98.5 fL (ref 80.0–100.0)
Platelets: 132 10*3/uL — ABNORMAL LOW (ref 150–400)
RBC: 2.61 MIL/uL — ABNORMAL LOW (ref 4.22–5.81)
RDW: 18.5 % — ABNORMAL HIGH (ref 11.5–15.5)
WBC: 8.6 10*3/uL (ref 4.0–10.5)
nRBC: 0 % (ref 0.0–0.2)

## 2021-03-21 LAB — BASIC METABOLIC PANEL
Anion gap: 10 (ref 5–15)
BUN: 46 mg/dL — ABNORMAL HIGH (ref 8–23)
CO2: 17 mmol/L — ABNORMAL LOW (ref 22–32)
Calcium: 9.8 mg/dL (ref 8.9–10.3)
Chloride: 114 mmol/L — ABNORMAL HIGH (ref 98–111)
Creatinine, Ser: 2.6 mg/dL — ABNORMAL HIGH (ref 0.61–1.24)
GFR, Estimated: 24 mL/min — ABNORMAL LOW (ref >60)
Glucose, Bld: 79 mg/dL (ref 70–99)
Potassium: 3.6 mmol/L (ref 3.5–5.1)
Sodium: 141 mmol/L (ref 135–145)

## 2021-03-22 LAB — SARS CORONAVIRUS 2 (TAT 6-24 HRS): SARS Coronavirus 2: NEGATIVE

## 2021-03-23 LAB — RENAL FUNCTION PANEL
Albumin: 2 g/dL — ABNORMAL LOW (ref 3.5–5.0)
Anion gap: 10 (ref 5–15)
BUN: 47 mg/dL — ABNORMAL HIGH (ref 8–23)
CO2: 17 mmol/L — ABNORMAL LOW (ref 22–32)
Calcium: 9.5 mg/dL (ref 8.9–10.3)
Chloride: 115 mmol/L — ABNORMAL HIGH (ref 98–111)
Creatinine, Ser: 2.69 mg/dL — ABNORMAL HIGH (ref 0.61–1.24)
GFR, Estimated: 23 mL/min — ABNORMAL LOW (ref >60)
Glucose, Bld: 74 mg/dL (ref 70–99)
Phosphorus: 2.8 mg/dL (ref 2.5–4.6)
Potassium: 3.2 mmol/L — ABNORMAL LOW (ref 3.5–5.1)
Sodium: 142 mmol/L (ref 135–145)

## 2021-03-23 LAB — CBC
HCT: 26.7 % — ABNORMAL LOW (ref 39.0–52.0)
Hemoglobin: 8.5 g/dL — ABNORMAL LOW (ref 13.0–17.0)
MCH: 31.4 pg (ref 26.0–34.0)
MCHC: 31.8 g/dL (ref 30.0–36.0)
MCV: 98.5 fL (ref 80.0–100.0)
Platelets: 126 10*3/uL — ABNORMAL LOW (ref 150–400)
RBC: 2.71 MIL/uL — ABNORMAL LOW (ref 4.22–5.81)
RDW: 19 % — ABNORMAL HIGH (ref 11.5–15.5)
WBC: 6 10*3/uL (ref 4.0–10.5)
nRBC: 0 % (ref 0.0–0.2)

## 2021-03-23 LAB — MAGNESIUM: Magnesium: 2.3 mg/dL (ref 1.7–2.4)

## 2021-04-04 DEATH — deceased

## 2021-04-13 ENCOUNTER — Encounter: Payer: Self-pay | Admitting: Orthopedic Surgery

## 2021-07-25 ENCOUNTER — Ambulatory Visit: Payer: 59 | Admitting: Urology

## 2021-11-17 ENCOUNTER — Encounter (HOSPITAL_COMMUNITY): Payer: Self-pay | Admitting: Radiology

## 2022-02-20 IMAGING — DX DG PELVIS 1-2V
1 series · 1 of 1 positions shown · non-contrast
Comparison: None.

CLINICAL DATA: Fall today

EXAM:
PELVIS - 1-2 VIEW

[pelvis ap]
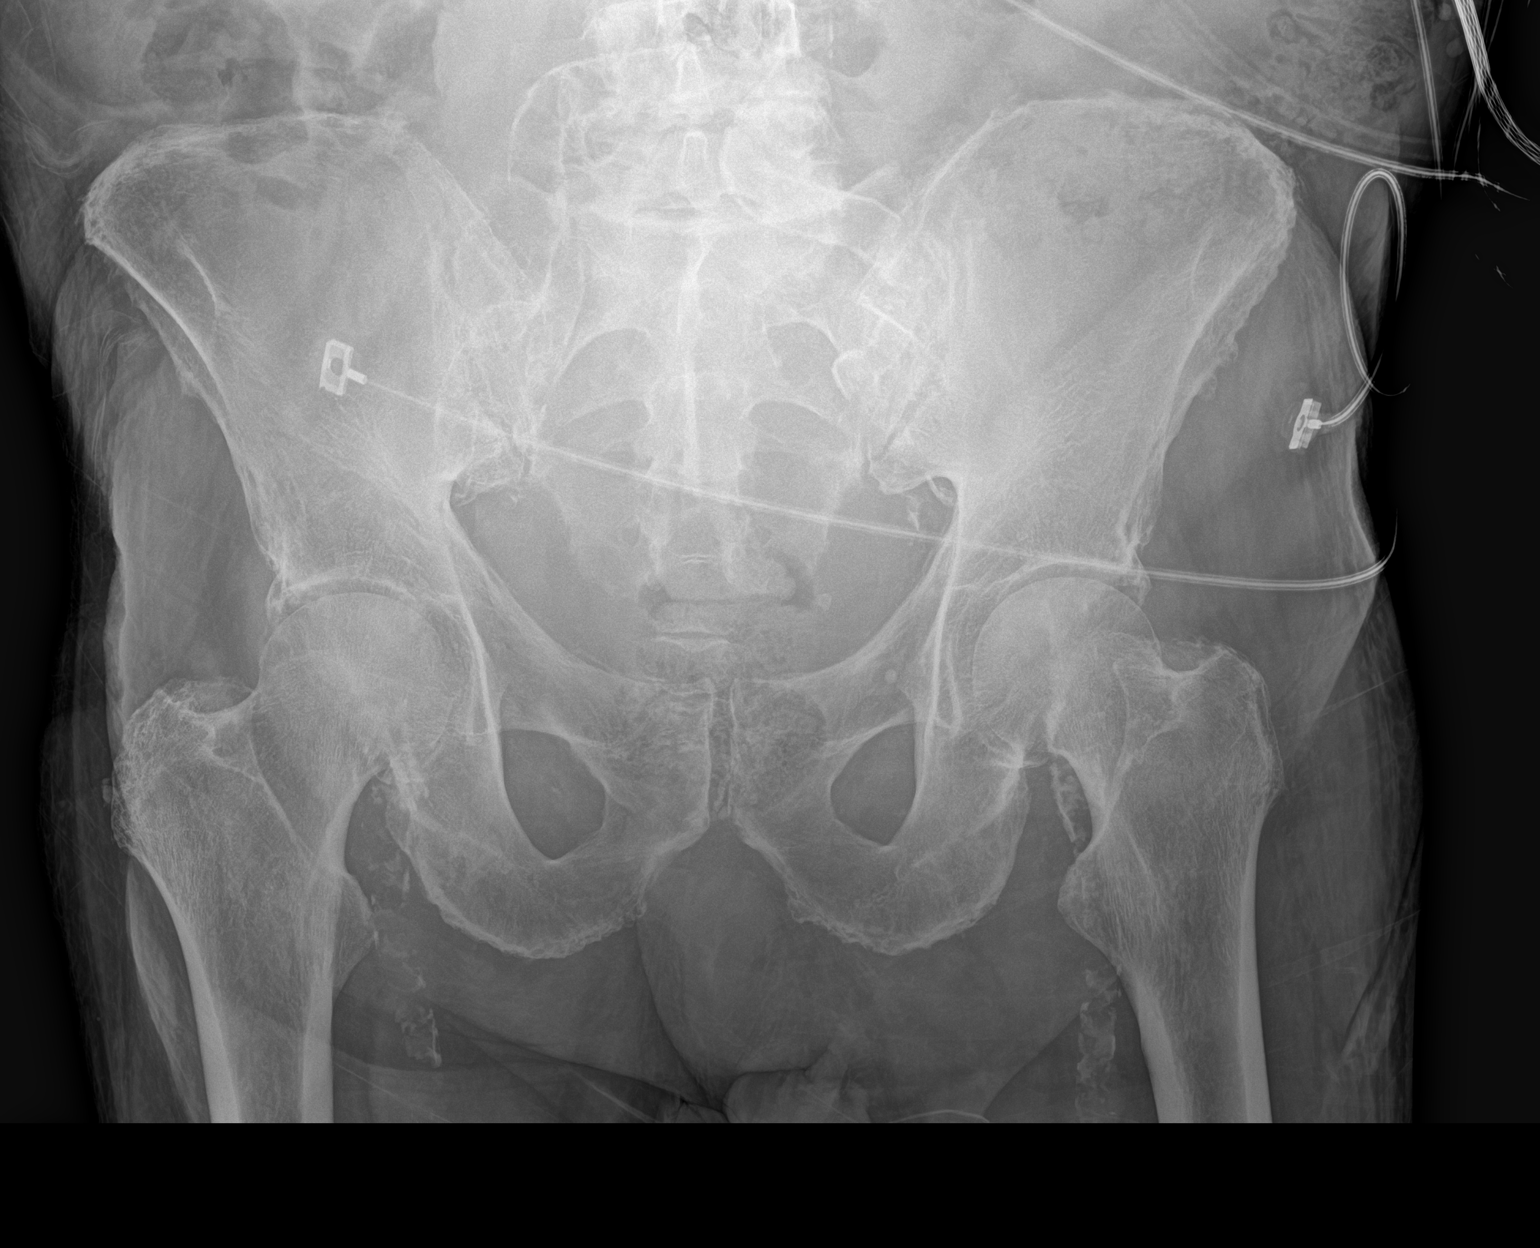

[1 of 1 positions shown; findings below may reference images not displayed]

FINDINGS: There is no evidence of pelvic fracture or diastasis. No pelvic bone
lesions are seen. Atherosclerotic calcification.
IMPRESSION: Negative.

## 2022-02-20 IMAGING — DX DG KNEE 1-2V*L*
2 series · 2 of 2 positions shown · non-contrast
Comparison: None

CLINICAL DATA: Left lower leg pain and swelling after fall

EXAM:
LEFT TIBIA AND FIBULA - 2 VIEW; LEFT KNEE - 1-2 VIEW

[knee ap]
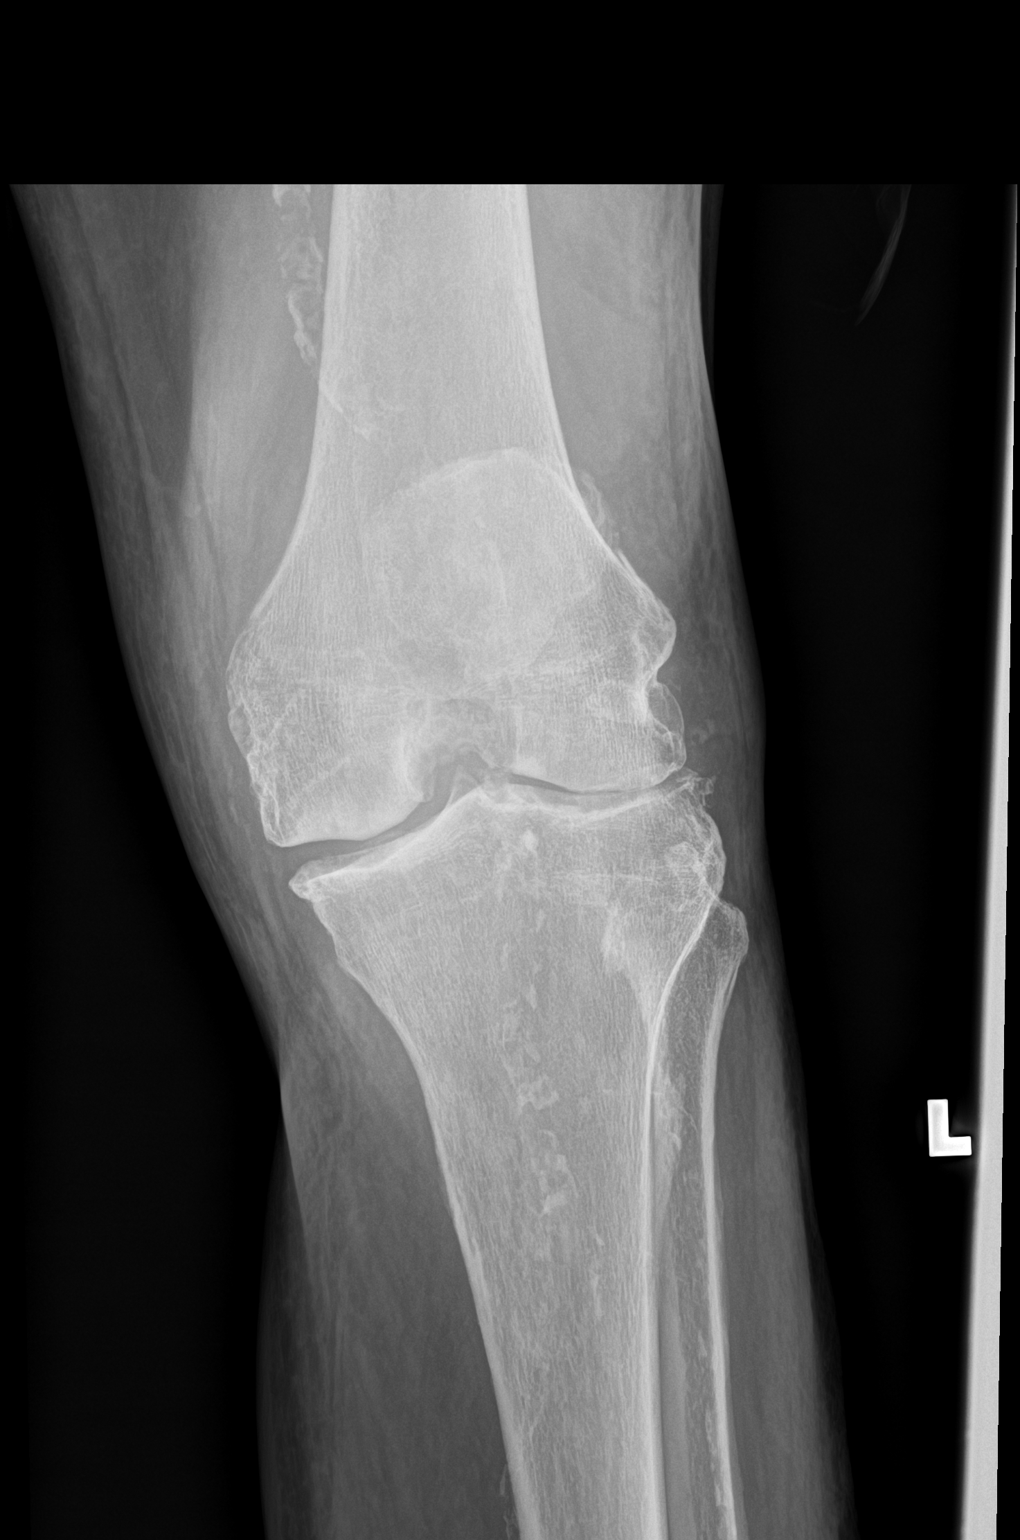

[knee lat]
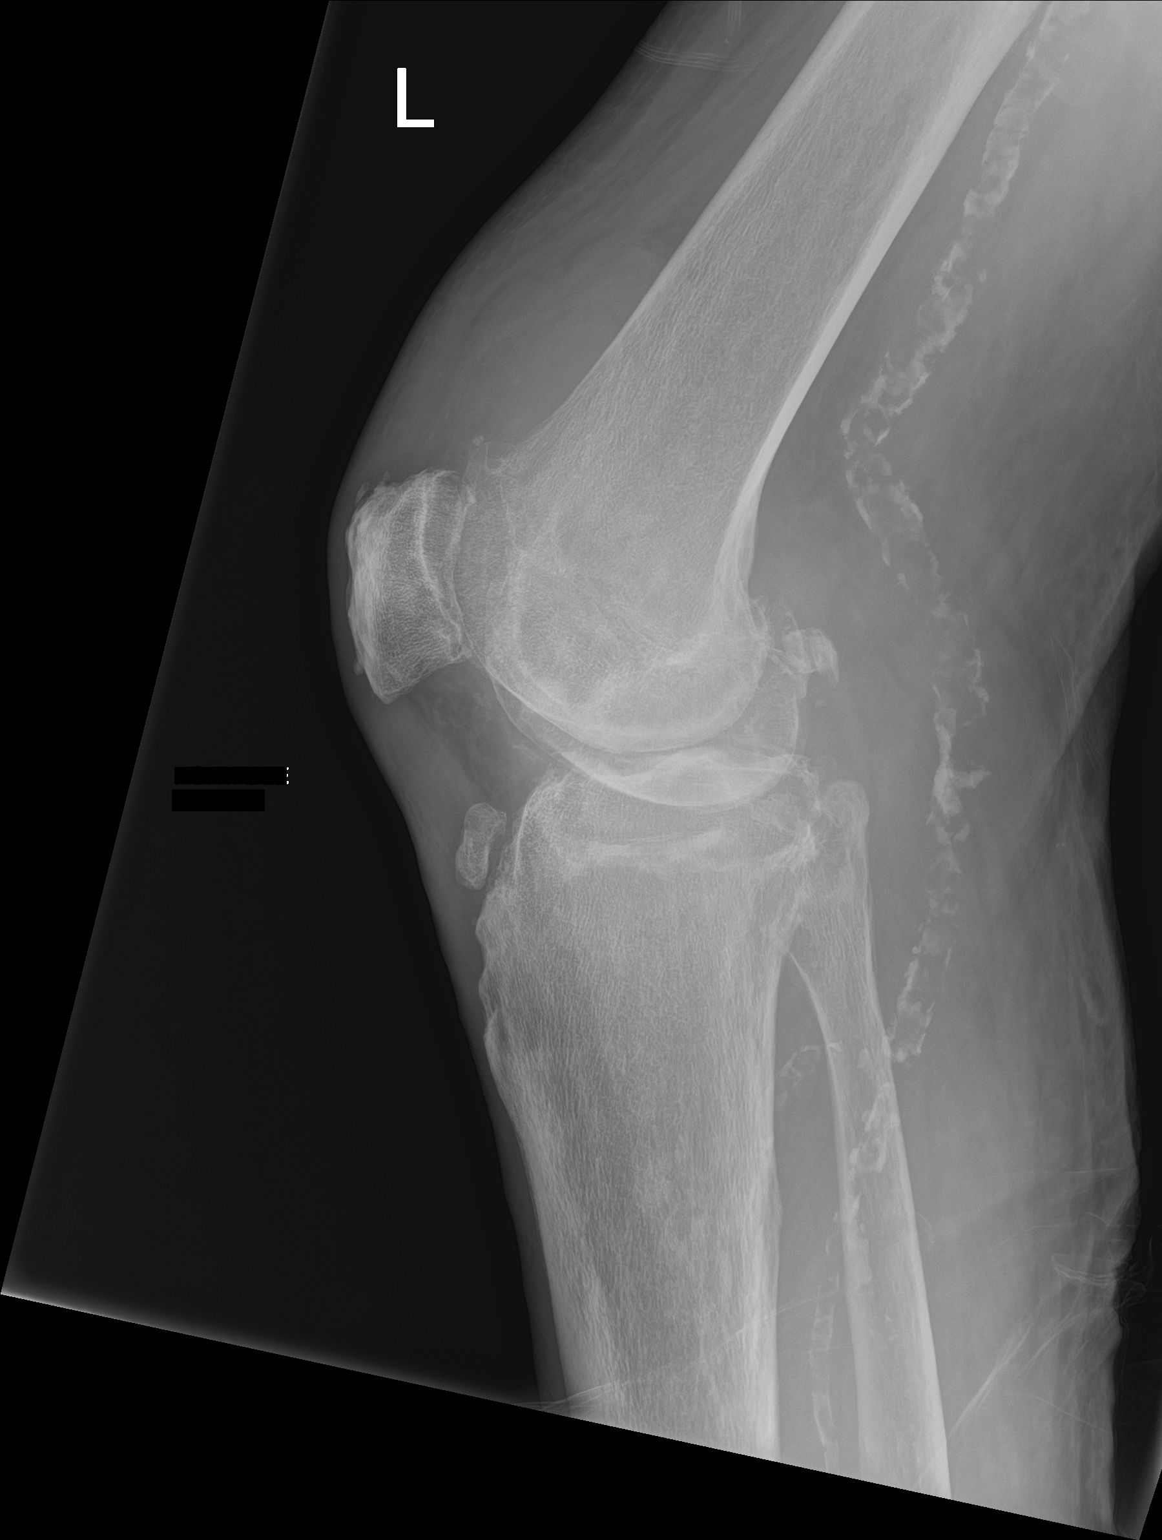

[2 of 2 positions shown; findings below may reference images not displayed]

FINDINGS: Subtle cortical irregularity involving the peripheral aspect of the
lateral tibial plateau articular surface suspicious for tibial
plateau fracture. There is a large knee joint effusion. Severe
tricompartmental osteoarthritis, most pronounced laterally.
Ossification within the region of the distal patellar tendon.

Remainder of the tibia and fibula are intact. Ankle mortise appears
congruent. Advanced degenerative changes within the midfoot. Diffuse
soft tissue edema, nonspecific. Extensive severe vascular
calcifications.
IMPRESSION: 1. Findings suspicious for a non-depressed lateral tibial plateau
fracture.
2. Large knee joint effusion, likely hemarthrosis.
3. Severe tricompartmental osteoarthritis of the left knee.

## 2022-02-27 IMAGING — DX DG CHEST 1V PORT
1 series · 1 of 1 positions shown · non-contrast
Comparison: February 19, 2021

CLINICAL DATA: Evaluate PICC line placement

EXAM:
PORTABLE CHEST 1 VIEW

[chest ap]
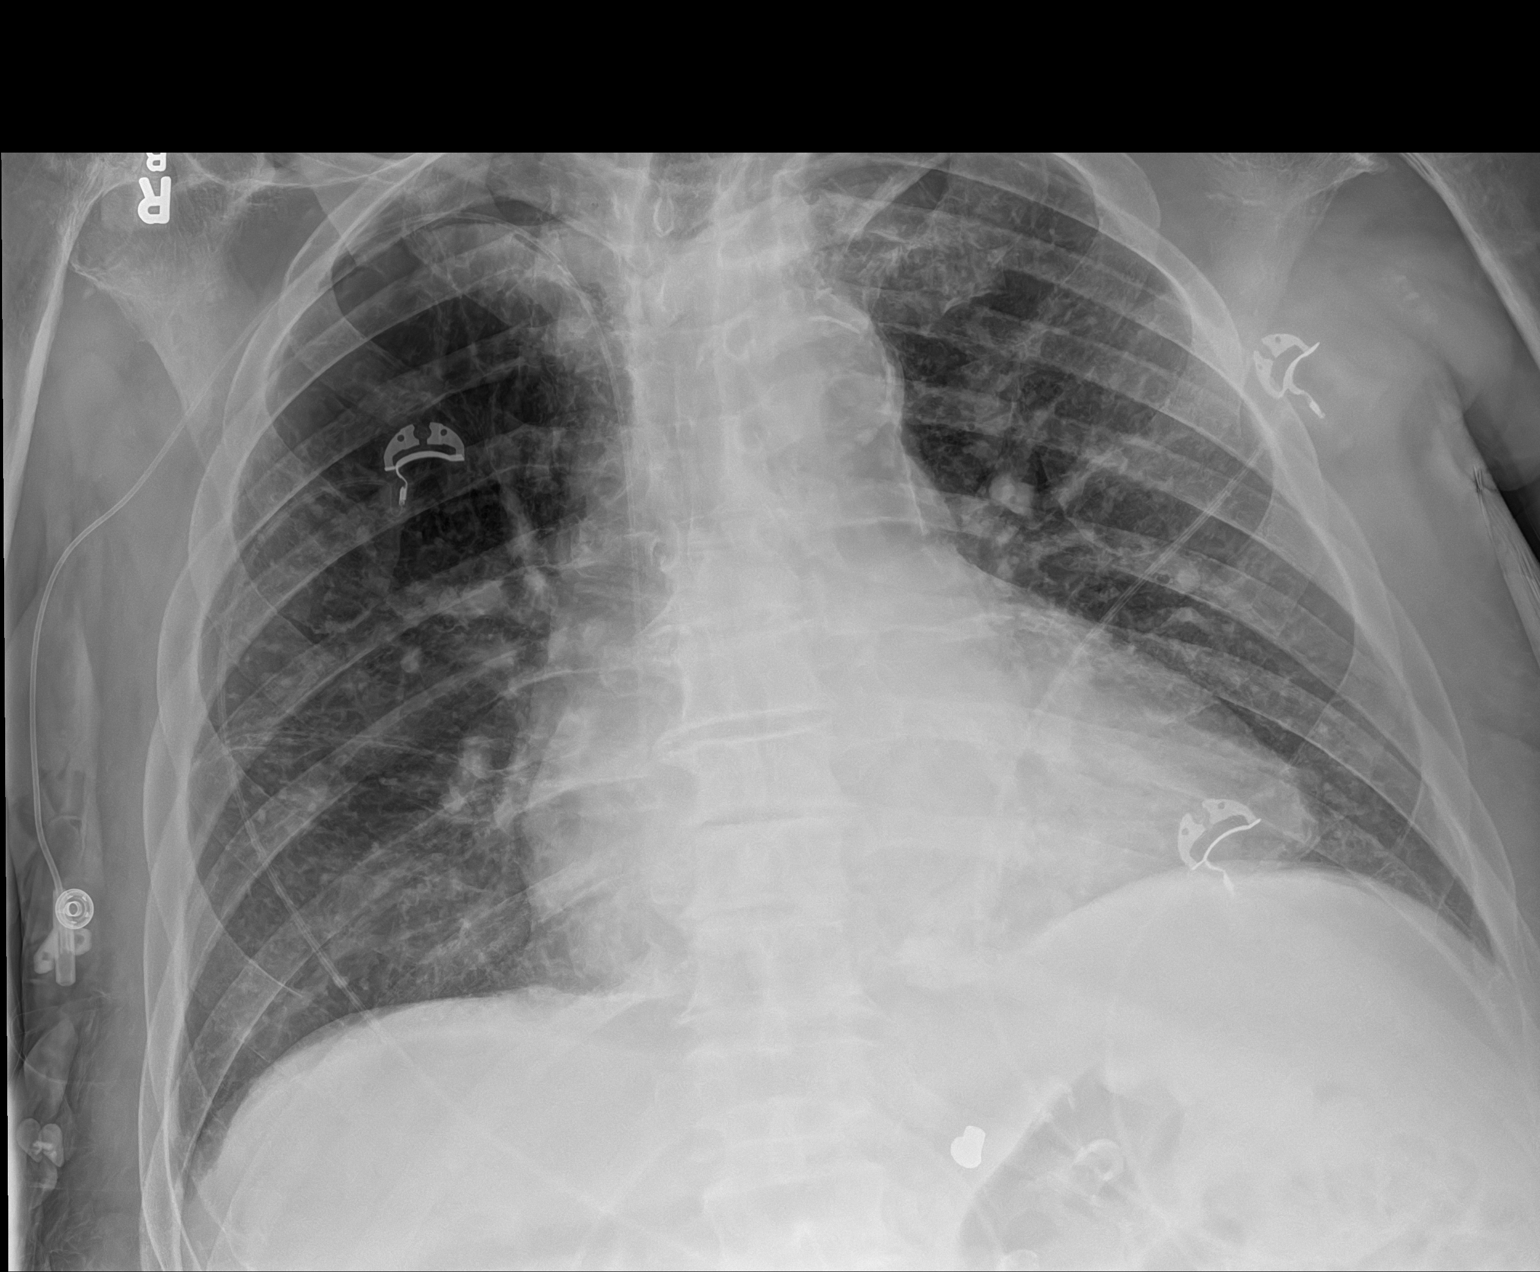

[1 of 1 positions shown; findings below may reference images not displayed]

FINDINGS: A new right PICC line terminates in the central SVC. No
pneumothorax. Stable cardiomegaly. No other changes.
IMPRESSION: The new right PICC line terminates in the central SVC. No
pneumothorax. No other changes.

## 2022-03-18 IMAGING — DX DG CHEST 1V PORT
1 series · 1 of 1 positions shown · non-contrast
Comparison: 02/26/2021

CLINICAL DATA: Pneumonia.

EXAM:
PORTABLE CHEST 1 VIEW

[chest ap]
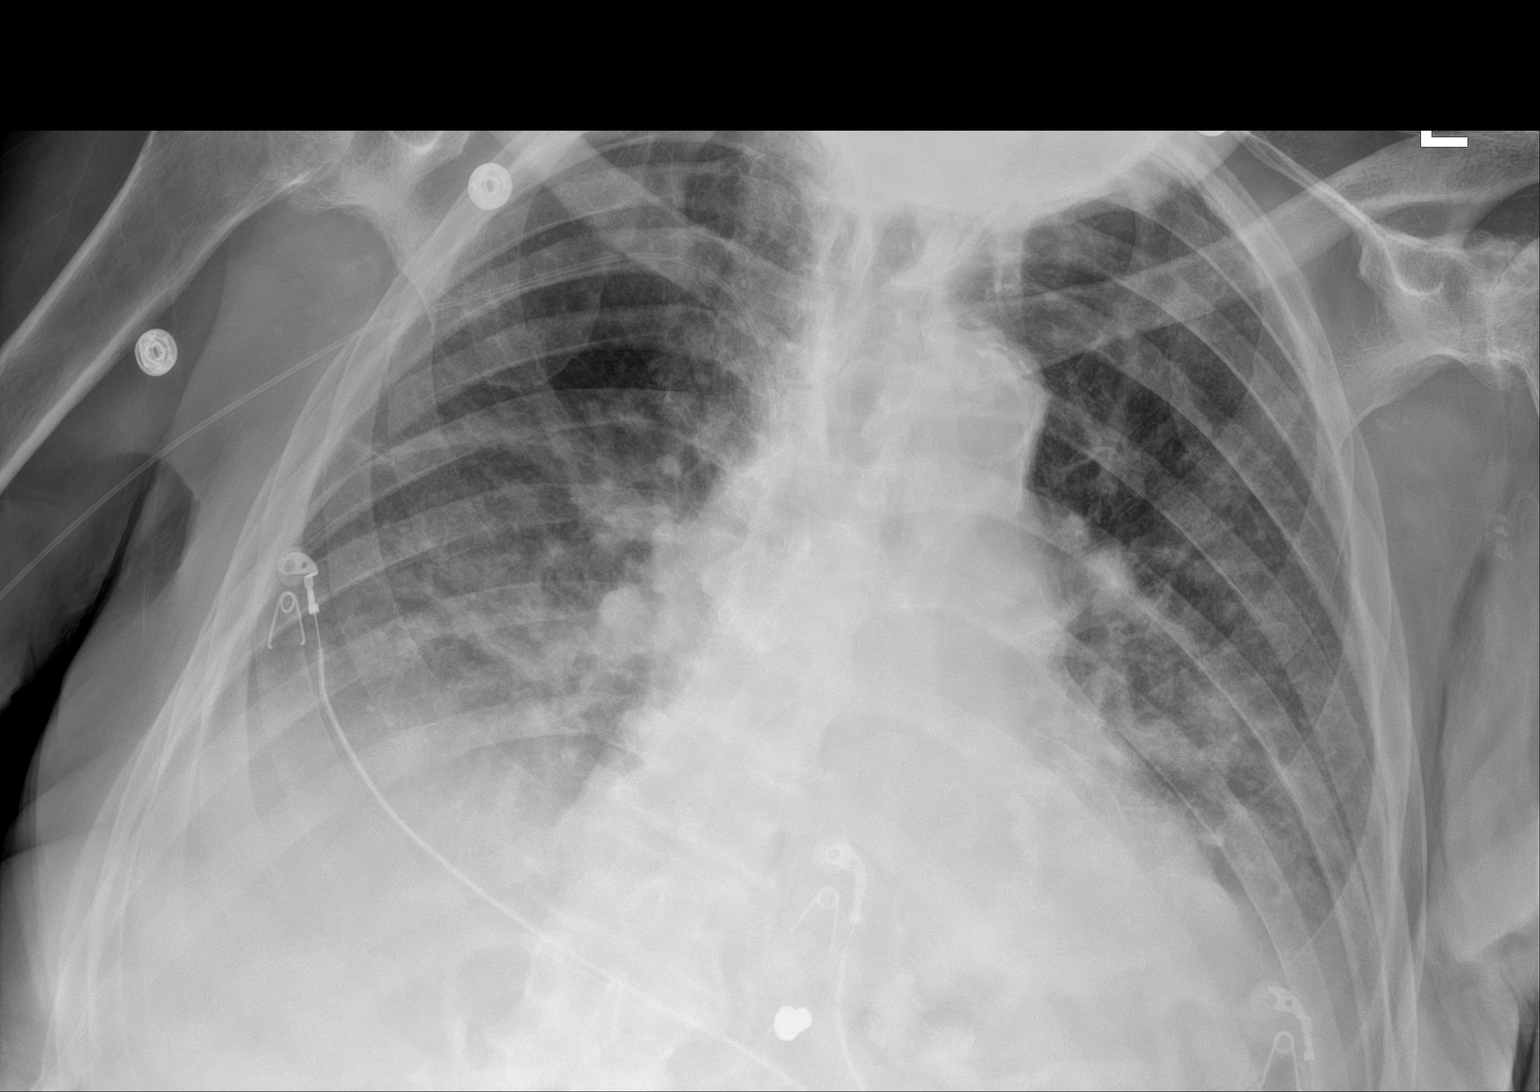

[1 of 1 positions shown; findings below may reference images not displayed]

FINDINGS: 9111 hours. Low lung volumes. SIRT cardiomegaly There is pulmonary
vascular congestion without overt pulmonary edema. Diffuse
interstitial opacity suggests edema. There is bibasilar
collapse/consolidation, left greater than right with probable small
bilateral pleural effusions. Right PICC line tip overlies the mid
SVC level. Telemetry leads overlie the chest.
IMPRESSION: 1. Cardiomegaly with pulmonary vascular congestion and probable
interstitial edema.
2. Bibasilar collapse/consolidation, left greater than right with
probable small bilateral pleural effusions.
